# Patient Record
Sex: Female | Born: 1955 | Race: White | Hispanic: No | Smoking: Former smoker
Health system: Southern US, Community
[De-identification: ages and names within clinical notes are randomized; demographics above are authoritative.]

## PROBLEM LIST (undated history)

## (undated) ENCOUNTER — Emergency Department (HOSPITAL_COMMUNITY): Admission: EM | Payer: 59 | Source: Home / Self Care

## (undated) DIAGNOSIS — IMO0001 Reserved for inherently not codable concepts without codable children: Secondary | ICD-10-CM

## (undated) DIAGNOSIS — F32A Depression, unspecified: Secondary | ICD-10-CM

## (undated) DIAGNOSIS — K219 Gastro-esophageal reflux disease without esophagitis: Secondary | ICD-10-CM

## (undated) DIAGNOSIS — T7840XA Allergy, unspecified, initial encounter: Secondary | ICD-10-CM

## (undated) DIAGNOSIS — F329 Major depressive disorder, single episode, unspecified: Secondary | ICD-10-CM

## (undated) DIAGNOSIS — J439 Emphysema, unspecified: Secondary | ICD-10-CM

## (undated) DIAGNOSIS — G8929 Other chronic pain: Secondary | ICD-10-CM

## (undated) DIAGNOSIS — F419 Anxiety disorder, unspecified: Secondary | ICD-10-CM

## (undated) DIAGNOSIS — M1990 Unspecified osteoarthritis, unspecified site: Secondary | ICD-10-CM

## (undated) HISTORY — DX: Anxiety disorder, unspecified: F41.9

## (undated) HISTORY — DX: Gastro-esophageal reflux disease without esophagitis: K21.9

## (undated) HISTORY — PX: BREAST CYST EXCISION: SHX579

## (undated) HISTORY — PX: FOOT SURGERY: SHX648

## (undated) HISTORY — DX: Unspecified osteoarthritis, unspecified site: M19.90

## (undated) HISTORY — DX: Other chronic pain: G89.29

## (undated) HISTORY — DX: Emphysema, unspecified: J43.9

## (undated) HISTORY — PX: ABDOMINAL HYSTERECTOMY: SHX81

## (undated) HISTORY — PX: KNEE SURGERY: SHX244

## (undated) HISTORY — PX: JOINT REPLACEMENT: SHX530

## (undated) HISTORY — DX: Allergy, unspecified, initial encounter: T78.40XA

---

## 1999-07-02 ENCOUNTER — Encounter: Admission: RE | Admit: 1999-07-02 | Discharge: 1999-07-02 | Payer: Self-pay | Admitting: Family Medicine

## 1999-07-02 ENCOUNTER — Encounter: Payer: Self-pay | Admitting: Family Medicine

## 2002-09-10 ENCOUNTER — Inpatient Hospital Stay (HOSPITAL_COMMUNITY): Admission: AD | Admit: 2002-09-10 | Discharge: 2002-09-14 | Payer: Self-pay | Admitting: Psychiatry

## 2002-09-15 ENCOUNTER — Other Ambulatory Visit (HOSPITAL_COMMUNITY): Admission: RE | Admit: 2002-09-15 | Discharge: 2002-09-20 | Payer: Self-pay | Admitting: Psychiatry

## 2005-10-13 ENCOUNTER — Other Ambulatory Visit: Admission: RE | Admit: 2005-10-13 | Discharge: 2005-10-13 | Payer: Self-pay | Admitting: Family Medicine

## 2005-11-13 ENCOUNTER — Emergency Department (HOSPITAL_COMMUNITY): Admission: EM | Admit: 2005-11-13 | Discharge: 2005-11-13 | Payer: Self-pay | Admitting: Emergency Medicine

## 2009-02-03 HISTORY — PX: SHOULDER SURGERY: SHX246

## 2009-06-11 ENCOUNTER — Ambulatory Visit (HOSPITAL_COMMUNITY): Admission: RE | Admit: 2009-06-11 | Discharge: 2009-06-11 | Payer: Self-pay | Admitting: Orthopaedic Surgery

## 2009-07-16 ENCOUNTER — Encounter (HOSPITAL_COMMUNITY): Admission: RE | Admit: 2009-07-16 | Discharge: 2009-08-15 | Payer: Self-pay | Admitting: Orthopaedic Surgery

## 2009-08-16 ENCOUNTER — Encounter (HOSPITAL_COMMUNITY): Admission: RE | Admit: 2009-08-16 | Discharge: 2009-09-15 | Payer: Self-pay | Admitting: Orthopaedic Surgery

## 2009-09-18 ENCOUNTER — Encounter (HOSPITAL_COMMUNITY): Admission: RE | Admit: 2009-09-18 | Discharge: 2009-10-18 | Payer: Self-pay | Admitting: Orthopaedic Surgery

## 2009-11-01 ENCOUNTER — Ambulatory Visit (HOSPITAL_COMMUNITY): Admission: RE | Admit: 2009-11-01 | Discharge: 2009-11-01 | Payer: Self-pay | Admitting: Orthopaedic Surgery

## 2010-07-26 ENCOUNTER — Encounter: Payer: Self-pay | Admitting: Family Medicine

## 2010-07-26 DIAGNOSIS — J45909 Unspecified asthma, uncomplicated: Secondary | ICD-10-CM | POA: Insufficient documentation

## 2010-08-16 ENCOUNTER — Encounter (HOSPITAL_COMMUNITY)
Admission: RE | Admit: 2010-08-16 | Discharge: 2010-08-16 | Disposition: A | Discharge status: Home / Self Care | Payer: BC Managed Care – PPO | Source: Ambulatory Visit | Attending: Orthopaedic Surgery | Admitting: Orthopaedic Surgery

## 2010-08-16 ENCOUNTER — Ambulatory Visit (HOSPITAL_COMMUNITY)
Admission: RE | Admit: 2010-08-16 | Discharge: 2010-08-16 | Disposition: A | Discharge status: Home / Self Care | Payer: BC Managed Care – PPO | Source: Ambulatory Visit | Attending: Ophthalmology | Admitting: Ophthalmology

## 2010-08-16 ENCOUNTER — Other Ambulatory Visit (HOSPITAL_COMMUNITY): Payer: Self-pay | Admitting: Ophthalmology

## 2010-08-16 DIAGNOSIS — Z01818 Encounter for other preprocedural examination: Secondary | ICD-10-CM | POA: Insufficient documentation

## 2010-08-16 DIAGNOSIS — Z01810 Encounter for preprocedural cardiovascular examination: Secondary | ICD-10-CM | POA: Insufficient documentation

## 2010-08-16 DIAGNOSIS — H269 Unspecified cataract: Secondary | ICD-10-CM | POA: Insufficient documentation

## 2010-08-16 DIAGNOSIS — Z01812 Encounter for preprocedural laboratory examination: Secondary | ICD-10-CM | POA: Insufficient documentation

## 2010-08-16 LAB — CBC
HCT: 43.2 % (ref 36.0–46.0)
Hemoglobin: 14.6 g/dL (ref 12.0–15.0)
MCH: 30.6 pg (ref 26.0–34.0)
MCHC: 33.8 g/dL (ref 30.0–36.0)
MCV: 90.6 fL (ref 78.0–100.0)
Platelets: 369 10*3/uL (ref 150–400)
RBC: 4.77 MIL/uL (ref 3.87–5.11)
RDW: 14.8 % (ref 11.5–15.5)
WBC: 10.5 10*3/uL (ref 4.0–10.5)

## 2010-08-16 LAB — COMPREHENSIVE METABOLIC PANEL
AST: 15 U/L (ref 0–37)
BUN: 23 mg/dL (ref 6–23)
CO2: 28 mEq/L (ref 19–32)
Calcium: 8.7 mg/dL (ref 8.4–10.5)
Chloride: 103 mEq/L (ref 96–112)
Creatinine, Ser: 0.75 mg/dL (ref 0.50–1.10)
GFR calc Af Amer: >60 mL/min (ref >60)
GFR calc non Af Amer: >60 mL/min (ref >60)
Glucose, Bld: 107 mg/dL — ABNORMAL HIGH (ref 70–99)
Total Bilirubin: 0.5 mg/dL (ref 0.3–1.2)

## 2010-08-16 LAB — URINALYSIS, ROUTINE W REFLEX MICROSCOPIC
Bilirubin Urine: NEGATIVE
Ketones, ur: NEGATIVE mg/dL
Leukocytes, UA: NEGATIVE
Nitrite: NEGATIVE
Urobilinogen, UA: 0.2 mg/dL (ref 0.0–1.0)
pH: 5 (ref 5.0–8.0)

## 2010-08-16 LAB — DIFFERENTIAL
Basophils Relative: 2 % — ABNORMAL HIGH (ref 0–1)
Eosinophils Absolute: 0.6 10*3/uL (ref 0.0–0.7)
Eosinophils Relative: 6 % — ABNORMAL HIGH (ref 0–5)
Neutrophils Relative %: 66 % (ref 43–77)

## 2010-08-16 LAB — PROTIME-INR
INR: 0.87 (ref 0.00–1.49)
Prothrombin Time: 12 seconds (ref 11.6–15.2)

## 2010-08-16 LAB — SURGICAL PCR SCREEN
MRSA, PCR: NEGATIVE
Staphylococcus aureus: NEGATIVE

## 2010-08-16 LAB — TYPE AND SCREEN
ABO/RH(D): O POS
Antibody Screen: NEGATIVE

## 2010-08-16 LAB — ABO/RH: ABO/RH(D): O POS

## 2010-08-16 LAB — APTT: aPTT: 29 seconds (ref 24–37)

## 2010-08-17 LAB — URINE CULTURE: Culture  Setup Time: 201207131755

## 2010-08-20 ENCOUNTER — Inpatient Hospital Stay (HOSPITAL_COMMUNITY)
Admission: RE | Admit: 2010-08-20 | Discharge: 2010-08-22 | DRG: 209 | Disposition: A | Discharge status: Home Health | Payer: BC Managed Care – PPO | Source: Ambulatory Visit | Attending: Orthopaedic Surgery | Admitting: Orthopaedic Surgery

## 2010-08-20 DIAGNOSIS — E876 Hypokalemia: Secondary | ICD-10-CM | POA: Diagnosis not present

## 2010-08-20 DIAGNOSIS — F329 Major depressive disorder, single episode, unspecified: Secondary | ICD-10-CM | POA: Diagnosis present

## 2010-08-20 DIAGNOSIS — Z23 Encounter for immunization: Secondary | ICD-10-CM

## 2010-08-20 DIAGNOSIS — M1710 Unilateral primary osteoarthritis, unspecified knee: Principal | ICD-10-CM | POA: Diagnosis present

## 2010-08-20 DIAGNOSIS — D62 Acute posthemorrhagic anemia: Secondary | ICD-10-CM | POA: Diagnosis not present

## 2010-08-20 DIAGNOSIS — IMO0002 Reserved for concepts with insufficient information to code with codable children: Principal | ICD-10-CM | POA: Diagnosis present

## 2010-08-20 DIAGNOSIS — Z87891 Personal history of nicotine dependence: Secondary | ICD-10-CM

## 2010-08-20 DIAGNOSIS — Z6833 Body mass index (BMI) 33.0-33.9, adult: Secondary | ICD-10-CM

## 2010-08-20 DIAGNOSIS — F3289 Other specified depressive episodes: Secondary | ICD-10-CM | POA: Diagnosis present

## 2010-08-20 DIAGNOSIS — E669 Obesity, unspecified: Secondary | ICD-10-CM | POA: Diagnosis present

## 2010-08-20 DIAGNOSIS — J45909 Unspecified asthma, uncomplicated: Secondary | ICD-10-CM | POA: Diagnosis present

## 2010-08-21 LAB — CBC
HCT: 31.1 % — ABNORMAL LOW (ref 36.0–46.0)
Hemoglobin: 10.4 g/dL — ABNORMAL LOW (ref 12.0–15.0)
MCV: 90.7 fL (ref 78.0–100.0)
RBC: 3.43 MIL/uL — ABNORMAL LOW (ref 3.87–5.11)
RDW: 14.7 % (ref 11.5–15.5)
WBC: 16.5 10*3/uL — ABNORMAL HIGH (ref 4.0–10.5)

## 2010-08-21 LAB — BASIC METABOLIC PANEL
BUN: 14 mg/dL (ref 6–23)
CO2: 27 mEq/L (ref 19–32)
Chloride: 103 mEq/L (ref 96–112)
Creatinine, Ser: 0.71 mg/dL (ref 0.50–1.10)
GFR calc Af Amer: >60 mL/min (ref >60)
Potassium: 4.2 mEq/L (ref 3.5–5.1)

## 2010-08-22 LAB — CBC
HCT: 27.6 % — ABNORMAL LOW (ref 36.0–46.0)
MCH: 30.5 pg (ref 26.0–34.0)
MCV: 91.4 fL (ref 78.0–100.0)
RBC: 3.02 MIL/uL — ABNORMAL LOW (ref 3.87–5.11)
RDW: 14.8 % (ref 11.5–15.5)
WBC: 9.3 10*3/uL (ref 4.0–10.5)

## 2010-08-22 LAB — BASIC METABOLIC PANEL
CO2: 27 mEq/L (ref 19–32)
Chloride: 105 mEq/L (ref 96–112)
Creatinine, Ser: 0.57 mg/dL (ref 0.50–1.10)
Glucose, Bld: 107 mg/dL — ABNORMAL HIGH (ref 70–99)

## 2010-08-30 NOTE — Op Note (Signed)
[name], Rebecca [name] NO.:  [id number]  MEDICAL RECORD NO.:  [id number]  LOCATION:  5016                         FACILITY:  [facility]  PHYSICIAN:  Claude Manges. Whitfield, M.D.DATE OF BIRTH:  Aug 30, 1955  DATE OF PROCEDURE:  08/20/2010 DATE OF DISCHARGE:                              OPERATIVE REPORT   PREOPERATIVE DIAGNOSES: 1. End-stage osteoarthritis, left knee. 2. Obesity (body mass index 33.3).  POSTOPERATIVE DIAGNOSES: 1. End-stage osteoarthritis, left knee. 2. Obesity (body mass index 33.3).  PROCEDURE:  Left total knee replacement.  SURGEON:  Claude Manges. Cleophas Dunker, MD  ASSISTANT:  Oris Drone. Petrarca, PA-C  ANESTHESIA:  General with supplemental femoral nerve block.  COMPLICATIONS:  None.  COMPONENTS:  DePuy LCS medium left femoral component, a size 3 tibial tray MBT revision, a 12.5-mm polyethylene bridging bearing and metal- backed 3-peg patella.  Components were secured with polymethylmethacrylate.  PROCEDURE:  [name] was met in the holding area and identified her left knee as the appropriate operative site.  Any questions were answered.  She was then transported to room #1 and placed under general orotracheal anesthesia.  She did receive a preoperative femoral nerve block in the holding area.  Nursing staff inserted a Foley catheter.  Urine was clear.  A tourniquet was applied to the left thigh.  The leg was then prepped with Betadine scrub and then DuraPrep from the tourniquet to the midfoot sterile draping was performed.  With the extremity still elevated was Esmarch exsanguinated with a proximal tourniquet at 350 mmHg.  A midline longitudinal incision was made centered about the patella extending from the superior pouch to the tibial tubercle.  Via sharp dissection, incision was carried down to the subcutaneous tissue.  The superficial capsule was incised in the midline and medial parapatellar incision was then made with the Bovie to the  deep capsule.  The joint was entered.  There was a minimal clear yellow effusion.  Patella was everted to 180 degrees.  The patella had a large circumferential osteophytes.  There was abundant beefy red synovitis.  Synovectomy was performed with a Bovie and the rongeur.  Large osteophytes removed from the medial and lateral femoral condyle.  There was near-complete loss of articular cartilage on the medial femoral condyle to a greater extent of the medial tibial plateau.  We did measure a medium femoral component.  Because of the patient's size, we elected to use the MBT tibia revision tray.  Accordingly, the first cut was made transversely with a 2-degree angle of declination using the external tibial guide.  Subsequent cuts were then made on the femur.  Flexion and extension gaps appeared to be symmetrical at 10 mm.  Lamina spreaders were then inserted into the medial and lateral compartments.  Medial and lateral menisci removed as well as ACL and PCL.  Osteophytes removed from the posterior femoral condyle medially and laterally.  MCL and LCL remained intact.  A 3-degree distal femoral valgus cut was made.  The finishing guide was applied to obtain the tapered cuts and to obtain the center holes in the anterior femur.  Retractors were then placed around the tibia and was advanced anteriorly.  We measured a #3  tibial tray.  Center hole was then made for the MBT revision stem.  This trial was then removed, and the revision stem with a revision tray with a long stem was then inserted, flushed on the tibia.  The keeled cut was then made with the MBT revision tibial tray in place.  A 10-mm bridging bearing was inserted followed by the medium femoral component.  I thought we had a little bit opening with varus and valgus stress and slight hyperextension. Accordingly, I switched to a 12.5-mm bridging bearing.  I thought we had excellent stability still with extension without  hyperextension, so we elected to use the 12.5-mm bridging bearing.  The patella was then prepared by removing approximately 8 mm of bone leaving 13 mm of patella thickness.  The 3 peg jig was then applied, 3 holes made, and the trial patella inserted and through a full range of motion, there was no instability.  Trial components removed.  The joint was copiously irrigated with saline solution.  Knee was placed in flexion.  Retractors were then inserted. The tibial tray was then inserted with polymethylmethacrylate.  It was then compressed and extraneous methacrylate was removed from around the edges.  The 12.5-mm bridging bearing was inserted followed by the cemented femoral component.  The knee was then placed in full extension. Any extraneous methacrylate above the femur and the tibia were removed.  Patella was also prepared with methacrylate and the patellar clamp, and again extraneous methacrylate was removed from around its periphery.  At approximately 14 minutes, the methacrylate had matured.  The joint was then explored.  Any hardened extraneous methacrylate was removed with a small osteotome and a mallet.  Exposed bone was covered with bone wax.  A 0.25% Marcaine with epinephrine was injected into the wound edges.  Tourniquet was deflated in an hour and 30 minutes.  Bleeding was controlled with the Bovie.  A medium-sized Hemovac was then inserted and exteriorized laterally.  We placed the knee through a full range of motion with perfect stability of the tibial tray.  No opening with varus or valgus stress.  Deep capsule was closed with interrupted #1 Ethibond.  Superficial capsule was closed with running 0 Vicryl.  Subcu closed in several layers with 2-0 Vicryl and 3-0 Monocryl.  Skin closed with Steri-Strips. Sterile bulky dressing was applied.  The patient was then awoken and returned to the postanesthesia recovery in satisfactory condition.     Claude Manges.  Cleophas Dunker, M.D.     PWW/MEDQ  D:  08/20/2010  T:  08/21/2010  Job:  [id number]  Electronically Signed by Norlene Campbell M.D. on 08/30/2010 04:51:21 PM

## 2010-09-12 ENCOUNTER — Ambulatory Visit (HOSPITAL_COMMUNITY)
Admission: RE | Admit: 2010-09-12 | Discharge: 2010-09-12 | Disposition: A | Discharge status: Home / Self Care | Payer: BC Managed Care – PPO | Source: Ambulatory Visit | Attending: Orthopaedic Surgery | Admitting: Orthopaedic Surgery

## 2010-09-12 DIAGNOSIS — R262 Difficulty in walking, not elsewhere classified: Secondary | ICD-10-CM | POA: Insufficient documentation

## 2010-09-12 DIAGNOSIS — M25669 Stiffness of unspecified knee, not elsewhere classified: Secondary | ICD-10-CM | POA: Insufficient documentation

## 2010-09-12 DIAGNOSIS — IMO0001 Reserved for inherently not codable concepts without codable children: Secondary | ICD-10-CM | POA: Insufficient documentation

## 2010-09-12 DIAGNOSIS — R269 Unspecified abnormalities of gait and mobility: Secondary | ICD-10-CM | POA: Insufficient documentation

## 2010-09-12 DIAGNOSIS — Z96659 Presence of unspecified artificial knee joint: Secondary | ICD-10-CM | POA: Insufficient documentation

## 2010-09-12 DIAGNOSIS — G8929 Other chronic pain: Secondary | ICD-10-CM | POA: Insufficient documentation

## 2010-09-12 DIAGNOSIS — M25569 Pain in unspecified knee: Secondary | ICD-10-CM | POA: Insufficient documentation

## 2010-09-12 NOTE — Progress Notes (Signed)
Physical Therapy Evaluation  Patient Name: Rebecca [name] Date: 09/12/2010 [id number] Charges: 1 Eval, 10' TE Re-eval Due: 10/10/10 Visit 1 of 8 HPI:  Past Surgical History: L TKR on 08/20/10  Assessment  POSTURE: WNL OBSERVATION: Incision healing well and does not appear to have signs of infection   MMT AROM in prone PROM in prone  Hip Flexion 4/5    Gluteus Medius  3/5    Gluteus Maximus 3/5    Adductor 3/5    Knee Extension 4/5 0 0  Knee Flexion 3+/5 75 100    NEUROLOGIC: n/a GAIT: mild antalgic gait w/SPC FUNCTION: See above.  Balance:  10 sec Tandem Stance BLE w/decreased ankle strategy.  R SLS: 10 sec, L SLS: 4 sec on static surface.  5 Sit to Stand: w/o UE support 8.0 sec PALPATION: Increased pain to medial knee   Special Test:  -90/90 HS test, - Ober's Sign, -Thomas Test, - S/S of DVT  Exercise/Treatments Supine: Hip Abduction 10x Hip Adduction 10x Single Leg Stance 3x30 sec.  SAQ's 10x L SLS 3x30 sec.  Goals PT Short Term Goals Short Term Goal 1: 1.Pt will be Independent in HEP in order to maximize therapeutic effect. Short Term Goal 2: 2.Pt will report pain less than 2/10 during 50%  of her day Short Term Goal 3: 3.Pt will demonstrate RLE SLS x20 sec. independently  Short Term Goal 4: 4.Pt will improve AROM to 0-100 PT Long Term Goals Long Term Goal 1: 1.Pt will improve L knee AROM to [facility] in order to ascend and descend stairs with 1 handrail and reciprocal pattern for easier entry into clients home.  Long Term Goal 2: 2.Pt will increase LE strength to [facility] in order to independently ambulate and stand for greater than an hour to participate in outdoor household activities. Long Term Goal 3: 3.Pt will improve her LEFS score by 9 points for improved perceived functional ability.  Long Term Goal 4: 4.Pt will demonstrate tandem walking x100 ft on uneven surface for improved safety with outdoor activities.  End of Session Patient Active Problem List    Diagnoses  . Asthma  . Knee pain  . Difficulty in walking  . Stiffness of joint, not elsewhere classified, lower leg  . S/P total knee replacement   PT - End of Session Activity Tolerance: Patient tolerated treatment well PT Assessment and Plan Clinical Impression Statement: Pt is a 55 y.o femal referred to PT s/p L TKR.  After examination it was found that the patient has current body structure impairments including increased pain, decreased functional strength, impaired balance, decreased ROM, and impaired perceived functional ability which are limiting her in the ability to participate in household, community and work related activities.  Pt will benefit from skilled physical therapy service to address the above body structure impairments in order to maximize function in order to improve quality of life. Rehab Potential: Good PT Frequency: Min 2X/week PT Duration: 4 weeks PT Treatment/Interventions: Gait training;Stair training;Functional mobility training;Therapeutic exercise;Neuromuscular re-education;Balance training;Patient/family education;Other (comment) (Manual and modalites for pain control ) PT Plan: Add Bike, steps, vector stance, squats, sit to stands w/o UE support, cybex when appropriate.   Time Calculation Start Time: 1109 Stop Time: 1152 Time Calculation (min): 43 min  Javarian Jakubiak 09/12/2010, 1:21 PM

## 2010-09-12 NOTE — Progress Notes (Signed)
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  Pt reports that she had a TKR after having multiple injuries to her L knee.  She reports that HHPT told her she had full extension and able to achieve 120 w/CPM, but with therapy was able to gain 95 degrees.  She reports that she continues to have some pain and would like to be functional and independent at with home and work related activities.  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 PREVIOUS THERAPY: HHPT for 6 visits  Cognitive Status: Pt is alert and oriented x4 Social History: married.  Raises chickens, ducks, turkeys, takes care of a garden.  Has a family with 2 grandchildren.  Enjoys being outside with her animals.   Current Functional Status: Lower Extremity Functional Scale Score (LEFS): 27/80. 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 Barriers to Education: None EMPLOYMENT DATA: Currently not working, but wants to go back.     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  Worse at night after being up on it all day.  Aggravating: Sitting with leg in dependent position.    Alleviating: resting with legs elevated OBJECTIVE: POSTURE: WNL OBSERVATION: Incision healing well and does not  appear to have signs of infection   MMT AROM in prone PROM in prone  Hip Flexion 4/5    Gluteus Medius  3/5    Gluteus Maximus 3/5    Adductor 3/5    Knee Extension 4/5 0 0  Knee Flexion 3+/5 75 100    NEUROLOGIC: n/a GAIT: mild antalgic gait w/SPC FUNCTION: See above.  Balance:  10 sec Tandem Stance BLE w/decreased ankle strategy.  R SLS: 10 sec, L SLS: 4 sec on static surface.  5 Sit to Stand: w/o UE support 8.0 sec PALPATION: Increased pain to medial knee  Special Test:  -90/90 HS test, - Ober's Sign, -Thomas Test, - S/S of DVT  PT Assessment and Plan Pt is a 55 y.o femal referred to PT s/p L TKR.  After examination it was found that the patient has current body structure impairments including increased pain, decreased functional strength, impaired balance, decreased ROM, difficulty with independent walking and impaired perceived functional ability which are limiting her in the ability to participate in household, community and work related activities.  Pt will benefit from skilled physical therapy service to address the above body structure impairments in order to maximize function in order to improve quality of life.      TREATMENT DIAGNOSIS: Knee Pain: 719.46, Knee stiffness 719.56, Difficulty walking 719.7, S/P TKR V43.65 Rehab Potential: Good PLAN: 1. Therapeutic exercise to include stretching, strengthening and HEP. 2. Gait training 3. Balance re-education 4. Manual techniques and modalities as needed for pain reduction.  FREQUENCY /  DURATION: 2 x/week x 4 weeks. SHORT TERM GOALS: to be met in 2 weeks. Patient will be able to: 1. Pt will be Independent in HEP in order to maximize therapeutic effect. 2. Pt will report pain less than 2/10 during 50%  of her day 3. Pt will demonstrate RLE SLS x20 sec. independently  4. Pt will improve AROM to 0-100 LONG TERM GOALS: to be met in 4 weeks. Patient will be able to: 1. Pt will improve L knee AROM to [facility] in order to ascend and  descend stairs with 1 handrail and reciprocal pattern for easier entry into clients home.  2. Pt will increase LE strength to [facility] in order to independently ambulate and stand for greater than an hour to participate in outdoor household activities. 3. Pt will improve her LEFS score by 9 points for improved perceived functional ability.  4. Pt will demonstrate tandem walking x100 ft on uneven surface for improved safety with outdoor activities.  INITIAL TREATMENT: Initial evaluation, therapeutic exercise and education in HEP. Pt agreeable to treatment plan.  PRECAUTIONS:N/A CONTRAINDICATIONS: N/A. Thank you for your referral,   ____________________________                            ______________________________   Annett Fabian, PT Date    Rebecca [name] 09/12/2010, 1:23 PM

## 2010-09-12 NOTE — Patient Instructions (Signed)
Initial evaluation, therapeutic exercise and education in HEP. Pt agreeable to treatment plan.

## 2010-09-17 ENCOUNTER — Ambulatory Visit (HOSPITAL_COMMUNITY)
Admission: RE | Admit: 2010-09-17 | Discharge: 2010-09-17 | Disposition: A | Discharge status: Home / Self Care | Payer: BC Managed Care – PPO | Source: Ambulatory Visit | Attending: Orthopaedic Surgery | Admitting: Orthopaedic Surgery

## 2010-09-17 NOTE — Progress Notes (Signed)
Physical Therapy Treatment Patient Name: Rebecca [name] [id number] Date: 09/17/2010  Time in:  2:37pm Time out: 3:12pm Visit:   2 of 8  Re-eval Due: 10/10/10  Charges:  Therex 32 minutes  HPI:  Past Surgical History: L TKR on 08/20/10   SUBJECTIVE: Symptoms/Limitations Symptoms: Pt. reports she has pain today from stretching it too far back. Pain Assessment Currently in Pain?: Yes Pain Score:   5 Pain Location: Knee Pain Orientation: Left Pain Type: Surgical pain   OBJECTIVE: Exercise/Treatments ROM/Warm-up:  Recumbent bike seat 10, 6 minutes at 2.0 STANDING: SLS L max of 21" (3 trials) Vector Stance 5X5" on Left Heel walking 1RT Toe walking 1RT Squats SEATED: Cybex leg press 5pl 2X10 Cybex quad 2pl 2X10 Cybex Hamstring 3pl 2X10    End of Session Patient Active Problem List  Diagnoses  . Asthma  . Knee pain  . Difficulty in walking  . Stiffness of joint, not elsewhere classified, lower leg  . S/P total knee replacement   PT - End of Session Activity Tolerance: Patient tolerated treatment well General Behavior During Session: [facility] for tasks performed Cognition: [facility] for tasks performed PT Assessment and Plan Clinical Impression Statement: Pt. unable to make full revolution on bike initially; able to complete all new therex without difficulty. PT Treatment/Interventions: Therapeutic exercise;Gait training PT Plan: Progress stength and stability; add wallsquats, step ups/downs, stool scoots and PROM for flexion.  Bascom Levels, Amy B 09/17/2010, 3:36 PM

## 2010-09-19 ENCOUNTER — Ambulatory Visit (HOSPITAL_COMMUNITY)
Admission: RE | Admit: 2010-09-19 | Discharge: 2010-09-19 | Disposition: A | Discharge status: Home / Self Care | Payer: BC Managed Care – PPO | Source: Ambulatory Visit

## 2010-09-19 NOTE — Progress Notes (Signed)
Physical Therapy Treatment Patient Details  Name: Rebecca [name] MRN: [id number] Date of Birth: 1955/05/12  Today's Date: 09/19/2010 Time: 1430-1530 Time Calculation (min): 60 min Visit#: 3 of 8 Re-eval: 10/10/10 Charge: therex 54 min  Subjective: Symptoms/Limitations Symptoms: 2-3/10 been on feet a lot today Pain Assessment Pain Score:   2 Pain Location: Knee Pain Orientation: Left  Objective:   Exercise/Treatments ROM/Warm-up: Recumbent bike seat 10, 6 minutes at 2.0  STANDING:  SLS L max of 21" (3 trials)  Vector Stance 5X5" on Left  Heel walking 1RT  Toe walking 1RT  Squats 10 x Wall squats 5x 5" 4" step up 10x  4" lateral step up 10x 4" step down 10x SEATED:  Stool scoots L LE 1RT Cybex leg press 5pl 2X10  Cybex quad 2pl 2X10  Cybex Hamstring 3pl 2X10 Supine: Heel slides 10x  PROM flexion 3x 30"   Physical Therapy Assessment and Plan PT Assessment and Plan Clinical Impression Statement: Pt tolerated well with treatment.  Pt stated pain increase following step down 4" and stool scoot.  Weak eccentric quad control presented with step down.  Pt able to perform all therex without diff just increase pain.  Stated would ice at home. PT Plan: Progress strength and stability.  Begin sidelying abduction and prone hip ext next session.    Goals    Problem List Patient Active Problem List  Diagnoses  . Asthma  . Knee pain  . Difficulty in walking  . Stiffness of joint, not elsewhere classified, lower leg  . S/P total knee replacement    PT - End of Session Activity Tolerance: Patient tolerated treatment well General Behavior During Session: [facility] for tasks performed Cognition: [facility] for tasks performed  Juel Burrow 09/19/2010, 3:40 PM

## 2010-09-24 ENCOUNTER — Ambulatory Visit (HOSPITAL_COMMUNITY)
Admission: RE | Admit: 2010-09-24 | Discharge: 2010-09-24 | Disposition: A | Discharge status: Home / Self Care | Payer: BC Managed Care – PPO | Source: Ambulatory Visit | Attending: Physical Therapy | Admitting: Physical Therapy

## 2010-09-24 NOTE — Progress Notes (Signed)
Physical Therapy Treatment Patient Details  Name: Rebecca [name] MRN: [id number] Date of Birth: 03-02-1955  Today's Date: 09/24/2010 Time: 1352-1440 Time Calculation (min): 48 min Visit#: 4 of 8 Re-eval: 10/10/10  Subjective: Symptoms/Limitations Symptoms: 2-3/10 today.   Pain Assessment Currently in Pain?: Yes Pain Score:   2 Pain Location: Knee Pain Orientation: Left Pain Type: Surgical pain       Exercise/Treatments ROM/Warm-up: Recumbent bike seat 10, 7 minutes at 3.0  STANDING:  SLS L max of  42" (3 trials)  Vector Stance 10X5" on Left  Heel walking 1RT  Toe walking 1RT  Wall squats 10x 5"  4" step up 10x  4" lateral step up 10x  4" step down 10x  SEATED:  Stool scoots L LE 1RT  Cybex leg press L LE 4pl 2X10  Cybex quad  L only 1.5pl 2X10  Cybex Hamstring L only 2pl 2X10  Supine:  Heel slides 10x  PROM flexion 3x 30" MFR and scar massage Sidelying: (add next visit) Hip Abduction Hip Adduction Prone:  (add next visit) Hip Extension  Manual Therapy Manual Therapy: Myofascial release Myofascial Release: MFR and scar massage to L knee incision to break up restrictions and adhesions.  Physical Therapy Assessment and Plan PT Assessment and Plan Clinical Impression Statement: Improving eccentric strength as demonstrated with step downs.  Progressed to isolation of L with cybex machines.  Added scar massage/MFR to break up adhesions.  Improved ROM afterward.  AROM to 95 degrees, PROM to 105 degrees. PT Treatment/Interventions: Therapeutic exercise PT Plan: Progress per POC.  Add sidelying abduction/adduction and prone hip ext next session.    Goals PT Short Term Goals PT Short Term Goal 1: 1.Pt will be Independent in HEP in order to maximize therapeutic effect. PT Short Term Goal 1 - Progress: Met PT Short Term Goal 2: 2.Pt will report pain less than 2/10 during 50%  of her day PT Short Term Goal 2 - Progress: Progressing toward goal PT Short Term Goal  3: 3.Pt will demonstrate RLE SLS x20 sec. independently  PT Short Term Goal 3 - Progress: Met PT Short Term Goal 4: 4.Pt will improve AROM to 0-100 PT Short Term Goal 4 - Progress: Progressing toward goal (AROM 95 degrees, PROM 105 degrees) PT Long Term Goals PT Long Term Goal 1: 1.Pt will improve L knee AROM to [facility] in order to ascend and descend stairs with 1 handrail and reciprocal pattern for easier entry into clients home.  PT Long Term Goal 1 - Progress: Not met PT Long Term Goal 2: 2.Pt will increase LE strength to [facility] in order to independently ambulate and stand for greater than an hour to participate in outdoor household activities. PT Long Term Goal 2 - Progress: Not met Long Term Goal 3: 3.Pt will improve her LEFS score by 9 points for improved perceived functional ability.  Long Term Goal 3 Progress: Not met Long Term Goal 4: 4.Pt will demonstrate tandem walking x100 ft on uneven surface for improved safety with outdoor activities.  Long Term Goal 4 Progress: Not met  Problem List Patient Active Problem List  Diagnoses  . Asthma  . Knee pain  . Difficulty in walking  . Stiffness of joint, not elsewhere classified, lower leg  . S/P total knee replacement    PT - End of Session Activity Tolerance: Patient tolerated treatment well General Behavior During Session: [facility] for tasks performed Cognition: [facility] for tasks performed  Emeline Gins B 09/24/2010, 2:49 PM

## 2010-09-27 ENCOUNTER — Ambulatory Visit (HOSPITAL_COMMUNITY)
Admission: RE | Admit: 2010-09-27 | Discharge: 2010-09-27 | Disposition: A | Discharge status: Home / Self Care | Payer: BC Managed Care – PPO | Source: Ambulatory Visit | Attending: Family Medicine | Admitting: Family Medicine

## 2010-09-27 NOTE — Progress Notes (Signed)
Physical Therapy Treatment Patient Details  Name: Rebecca [name] MRN: [id number] Date of Birth: 06/14/1955  Today's Date: 09/27/2010 Time: 4098-1191 Time Calculation (min): 53 min Charges: 39' TE, 8' Manual Visit#: 5 of 8 Re-eval: 10/10/10  Subjective: Symptoms/Limitations Symptoms: Pt reports she is feeling pretty good today.  she is able to get around well.   Pain Assessment Currently in Pain?: No/denies  Exercise/Treatments Aerobic Stationary Bike: 6'@3 .0 Stretches Knee: Self-Stretch to increase Flexion: 1 rep;30 seconds;Limitations Knee: Self-Stretch Limitations: Standing  Machines for Strengthening Cybex Knee Extension: L ecc lower 2.0 pl x10 Cybex Knee Flexion: L only 2.5 Pl 2x10 Cybex Leg Press: L only 4.5 Pl x10   Standing Lateral Step Up: Left;2 sets;10 reps;Step Height: 4" Forward Step Up: Left;2 sets;10 reps;Step Height: 4" Step Down: Left;2 sets;10 reps;Step Height: 4" Wall Squat: 10 reps;5 seconds SLS with Vectors: 10X5" Other Standing Knee Exercises: Heelwalk 2RT Other Standing Knee Exercises: [place] 2RT Seated Stool Scoot - Round Trips: 1 RT Supine Other Supine Knee Exercises: PROM see manual     Manual Therapy Manual Therapy: Joint mobilization Joint Mobilization: Grade II-IV Joint mobs to increase knee flexion  Other Manual Therapy: CTR to medial knee.Warm-up: Recumbent bike seat 10, 6 minutes at 3.0    Physical Therapy Assessment and Plan PT Assessment and Plan Clinical Impression Statement: Pt tolerated new increased weights and reps well without an increase in pain.  After manual therapy she was able to achieve AROM: 95 degrees PROM: 110 degrees.  Had notable decrease in scar tissue adhesions.  Continues to have difficulty with dynamic balance and antalgic gait.   PT Plan: Pt is completing 4 way SLR at home, added prone HS curls for HEP.  Cont to progress weight, reps and sets and improve overall knee flexion and gait abnormailities.      Goals    Problem List Patient Active Problem List  Diagnoses  . Asthma  . Knee pain  . Difficulty in walking  . Stiffness of joint, not elsewhere classified, lower leg  . S/P total knee replacement    PT - End of Session Activity Tolerance: Patient tolerated treatment well  Rhyker Silversmith 09/27/2010, 10:00 AM

## 2010-10-01 ENCOUNTER — Ambulatory Visit (HOSPITAL_COMMUNITY)
Admission: RE | Admit: 2010-10-01 | Discharge: 2010-10-01 | Disposition: A | Discharge status: Home / Self Care | Payer: BC Managed Care – PPO | Source: Ambulatory Visit

## 2010-10-01 NOTE — Progress Notes (Signed)
Physical Therapy Treatment Patient Details  Name: Rebecca [name] MRN: [id number] Date of Birth: 19-Jul-1955  Today's Date: 10/01/2010 Time: 1308-6578 Time Calculation (min): 50 min Visit#: 6 of 8 Re-eval: 10/10/10 Charge: therex: 44 min  Subjective: Symptoms/Limitations Symptoms: Pt stated pain free at entrance. Pain Assessment Currently in Pain?: No/denies  Objective: Supine PROM 0-113 degrees  Exercise/Treatments Stationary Bike: 6'@3 .0  Machines for Strengthening  Cybex Knee Extension: L ecc lower 2.0 pl x10  Cybex Knee Flexion: L only 2.5 Pl 2x10  Cybex Leg Press: L only 4.5 Pl x10   Standing  Lateral Step Up: Left;2 sets;15 reps;Step Height: 4"  Forward Step Up: Left;2 sets;15 reps;Step Height: 4"  Step Down: Left;2 sets;10 reps;Step Height: 4"  Wall Squat: 10 reps;5 seconds  SLS with Vectors: 10X5"  Other Standing Knee Exercises: Heelwalk 2RT  Other Standing Knee Exercises: Toewalk 2RT  Supine  Other Supine Knee Exercises: PROM see manual   Manual PROM 3x 30" for flexion.  Physical Therapy Assessment and Plan PT Assessment and Plan Clinical Impression Statement: Pt tolerated well to increased reps.  Weak eccentric control presented with step down.  No increased pain at end of session. PT Plan: Continue with current POC, progress ROM, strength and improve gait abnormalities.    Goals    Problem List Patient Active Problem List  Diagnoses  . Asthma  . Knee pain  . Difficulty in walking  . Stiffness of joint, not elsewhere classified, lower leg  . S/P total knee replacement    PT - End of Session Activity Tolerance: Patient tolerated treatment well General Behavior During Session: [facility] for tasks performed Cognition: [facility] for tasks performed  Juel Burrow 10/01/2010, 4:02 PM

## 2010-10-03 ENCOUNTER — Ambulatory Visit (HOSPITAL_COMMUNITY)
Admission: RE | Admit: 2010-10-03 | Discharge: 2010-10-03 | Disposition: A | Discharge status: Home / Self Care | Payer: BC Managed Care – PPO | Source: Ambulatory Visit | Attending: Orthopaedic Surgery | Admitting: Orthopaedic Surgery

## 2010-10-03 NOTE — Progress Notes (Signed)
Physical Therapy Treatment Patient Details  Name: Rebecca [name] MRN: [id number] Date of Birth: [birth year]  Today's Date: 10/03/2010 Time: 4098-1191 Time Calculation (min): 46 min Visit#: 7 of 8 Re-eval: 10/08/10 Charges:  Therex 40 mins  Subjective: Symptoms/Limitations Symptoms: Reports continues to do well.  No pain Pain Assessment Currently in Pain?: No/denies  Objective:  L Knee seated AROM 105 degrees, PROM 115 degrees  Exercise/Treatments Stationary Bike: 6'@3 .0  Machines for Strengthening  Cybex Knee Extension: L ecc lower 2.0 pl x10  Cybex Knee Flexion: L only 2.5 Pl 2x10  Cybex Leg Press: L only 4.5 Pl x10  Standing  Wall Squat: 10 reps;5 seconds  SLS 40" max SLS with Vectors: 10X5" bilaterally Other Standing Knee Exercises: Heelwalk 2RT  Other Standing Knee Exercises: Toewalk 2RT  Progressive lunges 1RT Staircase 1 flight reciprocally with 1 HR Seated:  PROM    Physical Therapy Assessment and Plan PT Assessment and Plan Clinical Impression Statement: Pt able to negotiate stairs reciprocally with 1 HR without difficulty.  Progressing well. PT Treatment/Interventions: Therapeutic exercise PT Plan: Re-eval next visit for possible discharge.       Problem List Patient Active Problem List  Diagnoses  . Asthma  . Knee pain  . Difficulty in walking  . Stiffness of joint, not elsewhere classified, lower leg  . S/P total knee replacement    PT - End of Session Activity Tolerance: Patient tolerated treatment well General Behavior During Session: [facility] for tasks performed  Lurena Nida 10/03/2010, 3:53 PM

## 2010-10-08 ENCOUNTER — Ambulatory Visit (HOSPITAL_COMMUNITY)
Admission: RE | Admit: 2010-10-08 | Discharge: 2010-10-08 | Disposition: A | Discharge status: Home / Self Care | Payer: BC Managed Care – PPO | Source: Ambulatory Visit | Attending: Family Medicine | Admitting: Family Medicine

## 2010-10-08 DIAGNOSIS — IMO0001 Reserved for inherently not codable concepts without codable children: Secondary | ICD-10-CM | POA: Insufficient documentation

## 2010-10-08 DIAGNOSIS — R269 Unspecified abnormalities of gait and mobility: Secondary | ICD-10-CM | POA: Insufficient documentation

## 2010-10-08 DIAGNOSIS — M25569 Pain in unspecified knee: Secondary | ICD-10-CM | POA: Insufficient documentation

## 2010-10-08 DIAGNOSIS — M25669 Stiffness of unspecified knee, not elsewhere classified: Secondary | ICD-10-CM | POA: Insufficient documentation

## 2010-10-08 NOTE — Progress Notes (Signed)
Physical Therapy Treatment/Discharge Summary Patient Details  Name: Rebecca [name] MRN: [id number] Date of Birth: [birth year]  Today's Date: 10/08/2010 Time: 4098-1191 Time Calculation (min): 38 min Charges: 1 MMT, 1 ROM, 25' TE Visit#: 8 of 8 Re-eval: 10/08/10  Subjective: Symptoms/Limitations Symptoms: I am doing excellent on my own.  I know I can do this all at home.  Pain Assessment Currently in Pain?: No/denies  Exercise/Treatments  Stationary Bike: 6'@3 .0 for ROM Standing   Wall Squat: 10 reps;5 seconds   SLS 42" max   SLS with Vectors: 10X5" bilaterally   Heelwalk 3RT   Toewalk 3RT   Progressive lunges 1RT   Staircase 1 flight reciprocally with 1 HR, lateral step ups 2 RT for glute med strengthening  5 Half kneels to stands with RLE and LLE Seated: PROM  Physical Therapy Assessment and Plan LEFS: 59/80 (27/80) Balance: L SLS: 42 sec Walking and Standing greater than an hour  MMT  AROM in prone  PROM in prone   Hip Flexion  5/5 (4/5)     Gluteus Medius  4/5 (3/5)     Gluteus Maximus  (3/5)     Adductor  (3/5)     Knee Extension  5/5 (4/5)  0  0   Knee Flexion  5/5 (3+/5)  90 (75)  110(100)    Pt was able to meet all of her functional goals. She continues to have difficulty with ROM againt gravity, however is able to achieve appropriate ROM while in the seated postion. Pt can independently demonstrate her HEP, has overall decreae in pain, improved balance, increased strength, functional ROM and improved percieved functional ablility. Pt will continue to benefit from her HEP.   Goals PT Short Term Goals PT Short Term Goal 1: 1.Pt will be Independent in HEP in order to maximize therapeutic effect. PT Short Term Goal 1 - Progress: Met PT Short Term Goal 2: 2.Pt will report pain less than 2/10 during 50%  of her day PT Short Term Goal 2 - Progress: Partly met PT Short Term Goal 3: 3.Pt will demonstrate RLE SLS x20 sec. independently  PT Short Term Goal 3 - Progress:  Met PT Short Term Goal 4: 4.Pt will improve AROM to 0-100 PT Short Term Goal 4 - Progress: Partly met PT Long Term Goals PT Long Term Goal 1: 1.Pt will improve L knee AROM to [facility] in order to ascend and descend stairs with 1 handrail and reciprocal pattern for easier entry into clients home.  PT Long Term Goal 1 - Progress: Met PT Long Term Goal 2: 2.Pt will increase LE strength to [facility] in order to independently ambulate and stand for greater than an hour to participate in outdoor household activities. PT Long Term Goal 2 - Progress: Met Long Term Goal 3: 3.Pt will improve her LEFS score by 9 points for improved perceived functional ability.  Long Term Goal 3 Progress: Met Long Term Goal 4: 4.Pt will demonstrate tandem walking x100 ft on uneven surface for improved safety with outdoor activities.  Long Term Goal 4 Progress: Met  Problem List Patient Active Problem List  Diagnoses  . Asthma  . Knee pain  . Difficulty in walking  . Stiffness of joint, not elsewhere classified, lower leg  . S/P total knee replacement    PT - End of Session Activity Tolerance: Patient tolerated treatment well  Janayia Burggraf 10/08/2010, 2:31 PM

## 2010-10-10 ENCOUNTER — Ambulatory Visit (HOSPITAL_COMMUNITY): Payer: BC Managed Care – PPO | Admitting: Physical Therapy

## 2011-12-11 ENCOUNTER — Other Ambulatory Visit (HOSPITAL_COMMUNITY): Payer: Self-pay | Admitting: Family Medicine

## 2011-12-11 DIAGNOSIS — Z1231 Encounter for screening mammogram for malignant neoplasm of breast: Secondary | ICD-10-CM

## 2012-01-13 ENCOUNTER — Ambulatory Visit (HOSPITAL_COMMUNITY)
Admission: RE | Admit: 2012-01-13 | Discharge: 2012-01-13 | Disposition: A | Discharge status: Home / Self Care | Payer: 59 | Source: Ambulatory Visit | Attending: Family Medicine | Admitting: Family Medicine

## 2012-01-13 DIAGNOSIS — Z1231 Encounter for screening mammogram for malignant neoplasm of breast: Secondary | ICD-10-CM | POA: Insufficient documentation

## 2012-06-24 ENCOUNTER — Ambulatory Visit (INDEPENDENT_AMBULATORY_CARE_PROVIDER_SITE_OTHER): Payer: 59 | Admitting: Otolaryngology

## 2012-06-24 DIAGNOSIS — H9209 Otalgia, unspecified ear: Secondary | ICD-10-CM

## 2012-06-24 DIAGNOSIS — H6980 Other specified disorders of Eustachian tube, unspecified ear: Secondary | ICD-10-CM

## 2012-06-24 DIAGNOSIS — H6120 Impacted cerumen, unspecified ear: Secondary | ICD-10-CM

## 2013-09-02 ENCOUNTER — Encounter (HOSPITAL_COMMUNITY): Payer: Self-pay | Admitting: Emergency Medicine

## 2013-09-02 ENCOUNTER — Inpatient Hospital Stay (HOSPITAL_COMMUNITY)
Admission: EM | Admit: 2013-09-02 | Discharge: 2013-09-04 | DRG: 918 | Disposition: A | Discharge status: Other Acute Inpatient Hospital | Payer: 59 | Attending: Internal Medicine | Admitting: Internal Medicine

## 2013-09-02 DIAGNOSIS — Z6841 Body Mass Index (BMI) 40.0 and over, adult: Secondary | ICD-10-CM

## 2013-09-02 DIAGNOSIS — T40601A Poisoning by unspecified narcotics, accidental (unintentional), initial encounter: Principal | ICD-10-CM | POA: Diagnosis present

## 2013-09-02 DIAGNOSIS — T426X1A Poisoning by other antiepileptic and sedative-hypnotic drugs, accidental (unintentional), initial encounter: Secondary | ICD-10-CM | POA: Diagnosis present

## 2013-09-02 DIAGNOSIS — J45909 Unspecified asthma, uncomplicated: Secondary | ICD-10-CM | POA: Diagnosis present

## 2013-09-02 DIAGNOSIS — R262 Difficulty in walking, not elsewhere classified: Secondary | ICD-10-CM

## 2013-09-02 DIAGNOSIS — M25669 Stiffness of unspecified knee, not elsewhere classified: Secondary | ICD-10-CM

## 2013-09-02 DIAGNOSIS — T394X2A Poisoning by antirheumatics, not elsewhere classified, intentional self-harm, initial encounter: Secondary | ICD-10-CM | POA: Diagnosis present

## 2013-09-02 DIAGNOSIS — Z23 Encounter for immunization: Secondary | ICD-10-CM

## 2013-09-02 DIAGNOSIS — T426X2A Poisoning by other antiepileptic and sedative-hypnotic drugs, intentional self-harm, initial encounter: Secondary | ICD-10-CM | POA: Diagnosis present

## 2013-09-02 DIAGNOSIS — T50902A Poisoning by unspecified drugs, medicaments and biological substances, intentional self-harm, initial encounter: Secondary | ICD-10-CM

## 2013-09-02 DIAGNOSIS — E66813 Obesity, class 3: Secondary | ICD-10-CM | POA: Diagnosis present

## 2013-09-02 DIAGNOSIS — Z87891 Personal history of nicotine dependence: Secondary | ICD-10-CM

## 2013-09-02 DIAGNOSIS — K219 Gastro-esophageal reflux disease without esophagitis: Secondary | ICD-10-CM | POA: Diagnosis present

## 2013-09-02 DIAGNOSIS — F332 Major depressive disorder, recurrent severe without psychotic features: Secondary | ICD-10-CM | POA: Diagnosis present

## 2013-09-02 DIAGNOSIS — T398X2A Poisoning by other nonopioid analgesics and antipyretics, not elsewhere classified, intentional self-harm, initial encounter: Secondary | ICD-10-CM

## 2013-09-02 DIAGNOSIS — T50901A Poisoning by unspecified drugs, medicaments and biological substances, accidental (unintentional), initial encounter: Secondary | ICD-10-CM

## 2013-09-02 DIAGNOSIS — Z79899 Other long term (current) drug therapy: Secondary | ICD-10-CM

## 2013-09-02 DIAGNOSIS — T400X1A Poisoning by opium, accidental (unintentional), initial encounter: Secondary | ICD-10-CM | POA: Diagnosis present

## 2013-09-02 DIAGNOSIS — T4271XA Poisoning by unspecified antiepileptic and sedative-hypnotic drugs, accidental (unintentional), initial encounter: Secondary | ICD-10-CM

## 2013-09-02 DIAGNOSIS — T4272XA Poisoning by unspecified antiepileptic and sedative-hypnotic drugs, intentional self-harm, initial encounter: Secondary | ICD-10-CM | POA: Diagnosis present

## 2013-09-02 DIAGNOSIS — Z96659 Presence of unspecified artificial knee joint: Secondary | ICD-10-CM

## 2013-09-02 HISTORY — DX: Major depressive disorder, single episode, unspecified: F32.9

## 2013-09-02 HISTORY — DX: Depression, unspecified: F32.A

## 2013-09-02 HISTORY — DX: Gastro-esophageal reflux disease without esophagitis: K21.9

## 2013-09-02 HISTORY — DX: Reserved for inherently not codable concepts without codable children: IMO0001

## 2013-09-02 LAB — CBC WITH DIFFERENTIAL/PLATELET
BASOS PCT: 2 % — AB (ref 0–1)
Basophils Absolute: 0.1 10*3/uL (ref 0.0–0.1)
EOS ABS: 0.6 10*3/uL (ref 0.0–0.7)
Eosinophils Relative: 7 % — ABNORMAL HIGH (ref 0–5)
HEMATOCRIT: 40.4 % (ref 36.0–46.0)
HEMOGLOBIN: 13 g/dL (ref 12.0–15.0)
LYMPHS ABS: 2.2 10*3/uL (ref 0.7–4.0)
Lymphocytes Relative: 25 % (ref 12–46)
MCH: 28.7 pg (ref 26.0–34.0)
MCHC: 32.2 g/dL (ref 30.0–36.0)
MCV: 89.2 fL (ref 78.0–100.0)
MONO ABS: 0.5 10*3/uL (ref 0.1–1.0)
MONOS PCT: 6 % (ref 3–12)
Neutro Abs: 5.3 10*3/uL (ref 1.7–7.7)
Neutrophils Relative %: 60 % (ref 43–77)
Platelets: 384 10*3/uL (ref 150–400)
RBC: 4.53 MIL/uL (ref 3.87–5.11)
RDW: 15.7 % — ABNORMAL HIGH (ref 11.5–15.5)
WBC: 8.8 10*3/uL (ref 4.0–10.5)

## 2013-09-02 LAB — COMPREHENSIVE METABOLIC PANEL
ALBUMIN: 3.5 g/dL (ref 3.5–5.2)
ALK PHOS: 119 U/L — AB (ref 39–117)
ALT: 24 U/L (ref 0–35)
AST: 22 U/L (ref 0–37)
Anion gap: 10 (ref 5–15)
BUN: 18 mg/dL (ref 6–23)
CALCIUM: 9.1 mg/dL (ref 8.4–10.5)
CO2: 27 mEq/L (ref 19–32)
Chloride: 102 mEq/L (ref 96–112)
Creatinine, Ser: 0.88 mg/dL (ref 0.50–1.10)
GFR calc Af Amer: 83 mL/min — ABNORMAL LOW (ref >90)
GFR calc non Af Amer: 72 mL/min — ABNORMAL LOW (ref >90)
Glucose, Bld: 115 mg/dL — ABNORMAL HIGH (ref 70–99)
POTASSIUM: 5.2 meq/L (ref 3.7–5.3)
SODIUM: 139 meq/L (ref 137–147)
TOTAL PROTEIN: 7.2 g/dL (ref 6.0–8.3)
Total Bilirubin: 0.2 mg/dL — ABNORMAL LOW (ref 0.3–1.2)

## 2013-09-02 LAB — BLOOD GAS, VENOUS
Acid-Base Excess: 0.7 mmol/L (ref 0.0–2.0)
BICARBONATE: 26.3 meq/L — AB (ref 20.0–24.0)
O2 Saturation: 78.4 %
PH VEN: 7.307 — AB (ref 7.250–7.300)
TCO2: 24.1 mmol/L (ref 0–100)
pCO2, Ven: 54.2 mmHg — ABNORMAL HIGH (ref 45.0–50.0)
pO2, Ven: 46.2 mmHg — ABNORMAL HIGH (ref 30.0–45.0)

## 2013-09-02 LAB — RAPID URINE DRUG SCREEN, HOSP PERFORMED
Amphetamines: NOT DETECTED
BARBITURATES: NOT DETECTED
BENZODIAZEPINES: NOT DETECTED
COCAINE: NOT DETECTED
Opiates: POSITIVE — AB
TETRAHYDROCANNABINOL: NOT DETECTED

## 2013-09-02 LAB — GLUCOSE, CAPILLARY
Glucose-Capillary: 120 mg/dL — ABNORMAL HIGH (ref 70–99)
Glucose-Capillary: 109 mg/dL — ABNORMAL HIGH (ref 70–99)
Glucose-Capillary: 85 mg/dL (ref 70–99)

## 2013-09-02 LAB — MRSA PCR SCREENING: MRSA BY PCR: NEGATIVE

## 2013-09-02 LAB — SALICYLATE LEVEL: Salicylate Lvl: <2 mg/dL — ABNORMAL LOW (ref 2.8–20.0)

## 2013-09-02 LAB — ACETAMINOPHEN LEVEL

## 2013-09-02 LAB — ETHANOL: Alcohol, Ethyl (B): <11 mg/dL (ref 0–11)

## 2013-09-02 MED ORDER — SODIUM CHLORIDE 0.9 % IV BOLUS (SEPSIS)
1000.0000 mL | Freq: Once | INTRAVENOUS | Status: AC
  Administered 2013-09-02: 1000 mL via INTRAVENOUS

## 2013-09-02 MED ORDER — CETYLPYRIDINIUM CHLORIDE 0.05 % MT LIQD
7.0000 mL | Freq: Two times a day (BID) | OROMUCOSAL | Status: DC
  Administered 2013-09-02 – 2013-09-03 (×4): 7 mL via OROMUCOSAL

## 2013-09-02 MED ORDER — ALBUTEROL SULFATE (2.5 MG/3ML) 0.083% IN NEBU
2.5000 mg | INHALATION_SOLUTION | RESPIRATORY_TRACT | Status: DC | PRN
  Administered 2013-09-03 – 2013-09-04 (×2): 2.5 mg via RESPIRATORY_TRACT
  Filled 2013-09-02 (×2): qty 3

## 2013-09-02 MED ORDER — PNEUMOCOCCAL VAC POLYVALENT 25 MCG/0.5ML IJ INJ
0.5000 mL | INJECTION | INTRAMUSCULAR | Status: DC
  Filled 2013-09-02: qty 0.5

## 2013-09-02 MED ORDER — MOMETASONE FURO-FORMOTEROL FUM 100-5 MCG/ACT IN AERO
2.0000 | INHALATION_SPRAY | Freq: Two times a day (BID) | RESPIRATORY_TRACT | Status: DC
  Administered 2013-09-02 – 2013-09-04 (×4): 2 via RESPIRATORY_TRACT
  Filled 2013-09-02: qty 8.8

## 2013-09-02 MED ORDER — FOLIC ACID 5 MG/ML IJ SOLN
1.0000 mg | Freq: Every day | INTRAMUSCULAR | Status: DC
  Administered 2013-09-03: 1 mg via INTRAVENOUS
  Filled 2013-09-02 (×3): qty 0.2

## 2013-09-02 MED ORDER — THIAMINE HCL 100 MG/ML IJ SOLN
Freq: Once | INTRAVENOUS | Status: AC
  Administered 2013-09-02: 13:00:00 via INTRAVENOUS
  Filled 2013-09-02: qty 1000

## 2013-09-02 MED ORDER — POLYETHYLENE GLYCOL 3350 17 G PO PACK: 17.0000 g | PACK | Freq: Every day | ORAL | Status: DC | PRN

## 2013-09-02 MED ORDER — TRAMADOL HCL 50 MG PO TABS
50.0000 mg | ORAL_TABLET | Freq: Three times a day (TID) | ORAL | Status: DC
  Administered 2013-09-03 – 2013-09-04 (×4): 50 mg via ORAL
  Filled 2013-09-02 (×4): qty 1

## 2013-09-02 MED ORDER — MOMETASONE FURO-FORMOTEROL FUM 100-5 MCG/ACT IN AERO
2.0000 | INHALATION_SPRAY | Freq: Two times a day (BID) | RESPIRATORY_TRACT | Status: DC
  Filled 2013-09-02: qty 8.8

## 2013-09-02 MED ORDER — NALOXONE HCL 0.4 MG/ML IJ SOLN
0.5000 mg | Freq: Once | INTRAMUSCULAR | Status: AC
  Administered 2013-09-02: 0.5 mg via INTRAVENOUS

## 2013-09-02 MED ORDER — PANTOPRAZOLE SODIUM 40 MG PO TBEC
40.0000 mg | DELAYED_RELEASE_TABLET | Freq: Every day | ORAL | Status: DC
  Administered 2013-09-03 – 2013-09-04 (×2): 40 mg via ORAL
  Filled 2013-09-02 (×2): qty 1

## 2013-09-02 MED ORDER — THIAMINE HCL 100 MG/ML IJ SOLN
100.0000 mg | Freq: Every day | INTRAMUSCULAR | Status: DC
  Administered 2013-09-02 – 2013-09-03 (×2): 100 mg via INTRAVENOUS
  Filled 2013-09-02 (×2): qty 2

## 2013-09-02 MED ORDER — DEXTROSE-NACL 5-0.45 % IV SOLN
INTRAVENOUS | Status: DC
  Administered 2013-09-02: 1000 mL via INTRAVENOUS
  Administered 2013-09-03 – 2013-09-04 (×2): via INTRAVENOUS

## 2013-09-02 MED ORDER — SODIUM CHLORIDE 0.9 % IV SOLN
Freq: Once | INTRAVENOUS | Status: AC
  Administered 2013-09-02: 08:00:00 via INTRAVENOUS

## 2013-09-02 MED ORDER — HEPARIN SODIUM (PORCINE) 5000 UNIT/ML IJ SOLN
5000.0000 [IU] | Freq: Three times a day (TID) | INTRAMUSCULAR | Status: DC
  Administered 2013-09-02 – 2013-09-04 (×5): 5000 [IU] via SUBCUTANEOUS
  Filled 2013-09-02 (×6): qty 1

## 2013-09-02 MED ORDER — PAROXETINE HCL ER 25 MG PO TB24
50.0000 mg | ORAL_TABLET | Freq: Every day | ORAL | Status: DC
  Administered 2013-09-03 – 2013-09-04 (×2): 50 mg via ORAL
  Filled 2013-09-02 (×3): qty 2

## 2013-09-02 MED ORDER — GABAPENTIN 300 MG PO CAPS
300.0000 mg | ORAL_CAPSULE | Freq: Three times a day (TID) | ORAL | Status: DC
Start: 2013-09-02 — End: 2013-09-04
  Administered 2013-09-03 – 2013-09-04 (×4): 300 mg via ORAL
  Filled 2013-09-02 (×4): qty 1

## 2013-09-02 MED ORDER — ONDANSETRON HCL 4 MG PO TABS: 4.0000 mg | ORAL_TABLET | Freq: Four times a day (QID) | ORAL | Status: DC | PRN

## 2013-09-02 MED ORDER — ONDANSETRON HCL 4 MG/2ML IJ SOLN: 4.0000 mg | Freq: Four times a day (QID) | INTRAMUSCULAR | Status: DC | PRN

## 2013-09-02 MED ORDER — SODIUM CHLORIDE 0.9 % IJ SOLN
3.0000 mL | Freq: Two times a day (BID) | INTRAMUSCULAR | Status: DC
  Administered 2013-09-02 – 2013-09-03 (×3): 3 mL via INTRAVENOUS

## 2013-09-02 MED ORDER — HYDRALAZINE HCL 20 MG/ML IJ SOLN: 5.0000 mg | Freq: Four times a day (QID) | INTRAMUSCULAR | Status: DC | PRN

## 2013-09-02 MED ORDER — SODIUM CHLORIDE 0.9 % IV SOLN
INTRAVENOUS | Status: AC
  Administered 2013-09-02: 11:00:00 via INTRAVENOUS

## 2013-09-02 NOTE — ED Notes (Signed)
0.4 mg of Narcan given at 8:10am.

## 2013-09-02 NOTE — ED Notes (Signed)
Notified poison control that patient stated in her suicide note that she took an unknown amount of Ambien, tramadol, and hydrocodone at 12:41 a.m. (0041). Poison control recommended to watch for CNS/ Resp depression, narcan (higher dose if needed), IV fluids if hypotensive (administerd on arrival to ED), labs, ekg, and monitor (already in place).  VSS.

## 2013-09-02 NOTE — ED Provider Notes (Addendum)
CSN: [id number]     Arrival date & time 09/02/13  0807 History  This chart was scribed for Doug SouSam Hephzibah Strehle, MD by Milly JakobJohn Lee Graves, ED Scribe. The patient was seen in room APA02/APA02. Patient's care was started at 8:05 AM.   Chief Complaint  Patient presents with  . Drug Overdose   The history is provided by the EMS personnel. The history is limited by the condition of the patient. No language interpreter was used.   Level 5 Caveat: Patient Unresponsive HPI Comments: Rebecca [name] is a 58 y.o. female who presents to the Emergency Department after an intentional overdose sometime after 12:00 AM  yesterday. Pt was found laying on the couch this morning by her husband, and it is reported by the EMS that she took unknown medication with the intention to harm herself. Patient left a suicide note stating that she took a lot of hydrocodone, tramadol and Ambien She is breathing, but unresponsive. Upon arrival her pupils are pinpoint and she arouses to sternal rub. Her husband reports that she has been depressed and was scheduled to have a mental health appointment this week. He reports a history of knee replacement and shoulder reconstruction. He reports that she does not smoke or drink usually, but that yesterday she had someone buy her a pack of cigarettes. He reports that her Doctor is Dr. Thomes LollingFulk.   PCP: Leo GrosserPICKARD,WARREN TOM, MD   Past Medical History  Diagnosis Date  . Asthma    No past surgical history on file. No family history on file. History  Substance Use Topics  . Smoking status: Not on file  . Smokeless tobacco: Not on file  . Alcohol Use: Not on file   her husband positive smoker positive alcohol no illicit drug OB History   Grav Para Term Preterm Abortions TAB SAB Ect Mult Living                 Review of Systems  Unable to perform ROS: Mental status change   Level 5 Caveat: Patient Unresponsive   Allergies  Sulfa antibiotics  Home Medications   Prior to Admission  medications   Medication Sig Start Date End Date Taking? Authorizing Provider  fluticasone-salmeterol (ADVAIR HFA) 115-21 MCG/ACT inhaler Inhale 2 puffs into the lungs 2 (two) times daily.      Historical Provider, MD   SpO2 90% Physical Exam  Nursing note and vitals reviewed. Constitutional: She appears well-developed and well-nourished.  Arousable to noxious stimulant with non-purposeful movement. GCS = 7. 2 for eye openning, 1 for verbal, and 4 for motor.   HENT:  Head: Normocephalic and atraumatic.  Eyes: Conjunctivae are normal. Pupils are equal, round, and reactive to light.  Neck: Neck supple. No tracheal deviation present. No thyromegaly present.  Cardiovascular: Normal rate and regular rhythm.   No murmur heard. Pulmonary/Chest: Effort normal and breath sounds normal.  Abdominal: Soft. Bowel sounds are normal. She exhibits no distension. There is no tenderness.  Musculoskeletal: Normal range of motion. She exhibits no edema and no tenderness.  Neurological: She is alert. Coordination normal.  Skin: Skin is warm and dry. No rash noted.  Psychiatric: She has a normal mood and affect.    ED Course  Procedures (including critical care time) DIAGNOSTIC STUDIES: Oxygen Saturation is 90% on room air, low by my interpretation.    COORDINATION OF CARE: 8:20 AM - 0.5 mg of NARCAN administered IV. Patient became more responsive at 8:25 am. GCS = 13.   6  for motor, 3 for eye opening, and 4 for verbal.   9:05 AM remainsomnolent. Arousable to verbal stimulus. Glasgow Coma Score remains at 13 9:35 AM neurologic status unchanged patient remains somnolent. Labs Review Labs Reviewed - No data to display  Imaging Review No results found.   EKG Interpretation   Date/Time:  Friday September 02 2013 08:12:55 EDT Ventricular Rate:  110 PR Interval:  156 QRS Duration: 80 QT Interval:  330 QTC Calculation: 446 R Axis:   82 Text Interpretation:  Sinus tachycardia Abnormal R-wave  progression, early  transition Borderline T wave abnormalities SINCE LAST TRACING HEART RATE  HAS INCREASED Confirmed by Ethelda Chick  MD, Adryanna Friedt [id number]) on 09/02/2013  8:14:35 AM      Poison control notified. Watch for mental status changes and respiratory status Results for orders placed during the hospital encounter of 09/02/13  BLOOD GAS, VENOUS      Result Value Ref Range   pH, Ven 7.307 (*) 7.250 - 7.300   pCO2, Ven 54.2 (*) 45.0 - 50.0 mmHg   pO2, Ven 46.2 (*) 30.0 - 45.0 mmHg   Bicarbonate 26.3 (*) 20.0 - 24.0 mEq/L   TCO2 24.1  0 - 100 mmol/L   Acid-Base Excess 0.7  0.0 - 2.0 mmol/L   O2 Saturation 78.4     Collection site VEIN     Drawn by COLLECTED BY LABORATORY     Sample type VENOUS    COMPREHENSIVE METABOLIC PANEL      Result Value Ref Range   Sodium 139  137 - 147 mEq/L   Potassium 5.2  3.7 - 5.3 mEq/L   Chloride 102  96 - 112 mEq/L   CO2 27  19 - 32 mEq/L   Glucose, Bld 115 (*) 70 - 99 mg/dL   BUN 18  6 - 23 mg/dL   Creatinine, Ser 6.04  0.50 - 1.10 mg/dL   Calcium 9.1  8.4 - 54.0 mg/dL   Total Protein 7.2  6.0 - 8.3 g/dL   Albumin 3.5  3.5 - 5.2 g/dL   AST 22  0 - 37 U/L   ALT 24  0 - 35 U/L   Alkaline Phosphatase 119 (*) 39 - 117 U/L   Total Bilirubin 0.2 (*) 0.3 - 1.2 mg/dL   GFR calc non Af Amer 72 (*) >90 mL/min   GFR calc Af Amer 83 (*) >90 mL/min   Anion gap 10  5 - 15  CBC WITH DIFFERENTIAL      Result Value Ref Range   WBC 8.8  4.0 - 10.5 K/uL   RBC 4.53  3.87 - 5.11 MIL/uL   Hemoglobin 13.0  12.0 - 15.0 g/dL   HCT 98.1  19.1 - 47.8 %   MCV 89.2  78.0 - 100.0 fL   MCH 28.7  26.0 - 34.0 pg   MCHC 32.2  30.0 - 36.0 g/dL   RDW 29.5 (*) 62.1 - 30.8 %   Platelets 384  150 - 400 K/uL   Neutrophils Relative % 60  43 - 77 %   Neutro Abs 5.3  1.7 - 7.7 K/uL   Lymphocytes Relative 25  12 - 46 %   Lymphs Abs 2.2  0.7 - 4.0 K/uL   Monocytes Relative 6  3 - 12 %   Monocytes Absolute 0.5  0.1 - 1.0 K/uL   Eosinophils Relative 7 (*) 0 - 5 %   Eosinophils  Absolute 0.6  0.0 - 0.7 K/uL   Basophils  Relative 2 (*) 0 - 1 %   Basophils Absolute 0.1  0.0 - 0.1 K/uL  URINE RAPID DRUG SCREEN (HOSP PERFORMED)      Result Value Ref Range   Opiates POSITIVE (*) NONE DETECTED   Cocaine NONE DETECTED  NONE DETECTED   Benzodiazepines NONE DETECTED  NONE DETECTED   Amphetamines NONE DETECTED  NONE DETECTED   Tetrahydrocannabinol NONE DETECTED  NONE DETECTED   Barbiturates NONE DETECTED  NONE DETECTED  ACETAMINOPHEN LEVEL      Result Value Ref Range   Acetaminophen (Tylenol), Serum <15.0  10 - 30 ug/mL  SALICYLATE LEVEL      Result Value Ref Range   Salicylate Lvl <2.0 (*) 2.8 - 20.0 mg/dL  ETHANOL      Result Value Ref Range   Alcohol, Ethyl (B) <11  0 - 11 mg/dL   No results found.  MDM   Final diagnoses:  None   9:40 AM spoke with Dr.Feliz admit to ICU. Patient will need monitoring of mental status, cardiopulmonary status and eventual psychiatric counseling Diagnosis #1 drug overdose #2 suicide attempt  CRITICAL CARE Performed by: Doug Sou Total critical care time: 45 minute Critical care time was exclusive of separately billable procedures and treating other patients. Critical care was necessary to treat or prevent imminent or life-threatening deterioration. Critical care was time spent personally by me on the following activities: development of treatment plan with patient and/or surrogate as well as nursing, discussions with consultants, evaluation of patient's response to treatment, examination of patient, obtaining history from patient or surrogate, ordering and performing treatments and interventions, ordering and review of laboratory studies, ordering and review of radiographic studies, pulse oximetry and re-evaluation of patient's condition.   I personally performed the services described in this documentation, which was scribed in my presence. The recorded information has been reviewed and considered. Doug Sou,  MD 09/02/13 1610  Doug Sou, MD 09/02/13 [phone]

## 2013-09-02 NOTE — ED Notes (Signed)
Pt more alert at this time to verbal stimuli, still obtunded, sats maintained at 98% 2L [place], husband at bedside, pt opens eyes and nods but speech still very slurred.

## 2013-09-02 NOTE — Progress Notes (Signed)
Nutrition Brief Note  Patient identified on the Malnutrition Screening Tool (MST) Report  Pt from home and presents with intentional overdose.   Wt Readings from Last 15 Encounters:  09/02/13 274 lb 4 oz (124.4 kg)    Body mass index is 45.64 kg/(m^2). Patient meets criteria for obesity class III based on current BMI.   Current diet order is NPO. Labs and medications reviewed.   No nutrition interventions warranted at this time. If nutrition issues arise, please consult RD.   Royann ShiversLynn Tova Vater MS,RD,CSG,LDN Office: [phone] Pager: [phone]

## 2013-09-02 NOTE — Progress Notes (Signed)
Pt's BP continues to elevate. MD paged and he ordered PRN dose of hydralazine if SBP>180. At this point pt's BP is 150s-160s/110s-120s. MD is aware and is not concerned about the diastolic pressure and does not want pt receiving medication unless SBP is >180. Will continue to monitor.

## 2013-09-02 NOTE — ED Notes (Signed)
Pt was found laying on the couch this morning by her husband. Pt took unknown meds with intention to harm herself. Pupils are pinpoint on arrival. Arouses to sternal rub.

## 2013-09-02 NOTE — H&P (Signed)
[facility] History and Physical  Rebecca [name] ZOX:[id number] DOB: 02/13/1955 DOA: 09/02/2013  Referring physician: Dr. Clearnce HastenJackopbowitz PCP: Leo GrosserPICKARD,WARREN TOM, MD   Chief Complaint: intentional overdose  HPI: Rebecca [name] is a 58 y.o. female  58 year old female with past medical history of suicide attempt more than 2  years ago more than by EMS for an intentional overdose sometime around 12 AM on the morning of admission. The patient was found on the couch by her husband. She was breathing but unresponsive. The daughter relates that she has been under the weather for the last 10 months she was started on Abilify by her primary care doctor and there was some improvement. She was supposed to have a mental health appointment last week but had to be rescheduled.  In the ED: She was given Narcan by Dr. Rennis ChrisJacobowitz with improvement. Basic metabolic panel is within normal limits CBC does not show any leukocytosis was of a level less than 5 acetaminophen level less than 50 alcohol level was 100 her UDS was positive for opiates. An EKG shows sinus tachycardia with a QTC of 446. Glasgow Coma Scale on admission was 6 after Narcan 13.   Review of Systems:  Constitutional:  No weight loss, night sweats, Fevers, chills, fatigue.  HEENT:  No headaches, Difficulty swallowing,Tooth/dental problems,Sore throat,  No sneezing, itching, ear ache, nasal congestion, post nasal drip,  Cardio-vascular:  No chest pain, Orthopnea, PND, swelling in lower extremities, anasarca, dizziness, palpitations  GI:  No heartburn, indigestion, abdominal pain, nausea, vomiting, diarrhea, change in bowel habits, loss of appetite  Resp:  No shortness of breath with exertion or at rest. No excess mucus, no productive cough, No non-productive cough, No coughing up of blood.No change in color of mucus.No wheezing.No chest wall deformity  Skin:  no rash or lesions.  GU:  no dysuria, change in color of urine, no urgency  or frequency. No flank pain.  Musculoskeletal:  No joint pain or swelling. No decreased range of motion. No back pain.  Psych:  No change in mood or affect. No depression or anxiety. No memory loss.   Past Medical History  Diagnosis Date  . Asthma   . Depression   . Reflux    Past Surgical History  Procedure Laterality Date  . Unable     Social History:  reports that she drinks about 1.2 ounces of alcohol per week. She reports that she does not use illicit drugs. Her tobacco history is not on file.  Allergies  Allergen Reactions  . Sulfa Antibiotics Other (See Comments)    unknown    History reviewed. No pertinent family history.   Prior to Admission medications   Medication Sig Start Date End Date Taking? Authorizing Provider  ALPRAZolam Prudy Feeler(XANAX) 0.5 MG tablet Take 0.25-0.5 mg by mouth 2 (two) times daily as needed for anxiety.   Yes Historical Provider, MD  ARIPiprazole (ABILIFY) 5 MG tablet Take 5 mg by mouth daily.   Yes Historical Provider, MD  fluticasone-salmeterol (ADVAIR HFA) 115-21 MCG/ACT inhaler Inhale 2 puffs into the lungs 2 (two) times daily.     Yes Historical Provider, MD  gabapentin (NEURONTIN) 300 MG capsule Take 300 mg by mouth 3 (three) times daily.   Yes Historical Provider, MD  HYDROcodone-acetaminophen (NORCO) 7.5-325 MG per tablet Take 1-2 tablets by mouth every 4 (four) hours as needed for moderate pain.   Yes Historical Provider, MD  lidocaine (LIDODERM) 5 % Place 1 patch onto the skin daily. Remove &  Discard patch within 12 hours or as directed by MD   Yes Historical Provider, MD  pantoprazole (PROTONIX) 40 MG tablet Take 40 mg by mouth daily.   Yes Historical Provider, MD  PARoxetine (PAXIL-CR) 25 MG 24 hr tablet Take 50 mg by mouth daily.   Yes Historical Provider, MD  rOPINIRole (REQUIP) 0.5 MG tablet Take 0.5 mg by mouth at bedtime.    Yes Historical Provider, MD  traMADol (ULTRAM) 50 MG tablet Take 50 mg by mouth 3 (three) times daily.   Yes  Historical Provider, MD  zolpidem (AMBIEN) 10 MG tablet Take 10 mg by mouth at bedtime.   Yes Historical Provider, MD   Physical Exam: Filed Vitals:   09/02/13 0905 09/02/13 0922 09/02/13 0930 09/02/13 0935  BP: 118/68  119/73 116/88  Pulse: 97  99 105  Temp:  97.2 F (36.2 C)    TempSrc:  Rectal    Resp: 12  11 11   SpO2: 96%  97% 96%    Wt Readings from Last 3 Encounters:  No data found for Wt    General: Somnolent Eyes: PERRL, normal lids, irises & conjunctiva ENT: grossly normal hearing, lips & tongue Neck: no LAD, masses or thyromegaly Cardiovascular: RRR, no m/r/g. No LE edema. Telemetry: SR, no arrhythmias  Respiratory: Good air movement with crackles on the right. Abdomen: soft, ntnd Skin: no rash or induration seen on limited exam Musculoskeletal: grossly normal tone BUE/BLE Neurologic: grossly non-focal.          Labs on Admission:  Basic Metabolic Panel:  Recent Labs Lab 09/02/13 0841  NA 139  K 5.2  CL 102  CO2 27  GLUCOSE 115*  BUN 18  CREATININE 0.88  CALCIUM 9.1   Liver Function Tests:  Recent Labs Lab 09/02/13 0841  AST 22  ALT 24  ALKPHOS 119*  BILITOT 0.2*  PROT 7.2  ALBUMIN 3.5   No results found for this basename: LIPASE, AMYLASE,  in the last 168 hours No results found for this basename: AMMONIA,  in the last 168 hours CBC:  Recent Labs Lab 09/02/13 0841  WBC 8.8  NEUTROABS 5.3  HGB 13.0  HCT 40.4  MCV 89.2  PLT 384   Cardiac Enzymes: No results found for this basename: CKTOTAL, CKMB, CKMBINDEX, TROPONINI,  in the last 168 hours  BNP (last 3 results) No results found for this basename: PROBNP,  in the last 8760 hours CBG: No results found for this basename: GLUCAP,  in the last 168 hours  Radiological Exams on Admission: No results found.  EKG: Independently reviewed. Sinus tach  Assessment/Plan  Drug overdose, intentional - UDS was positive for opioids. She has a Xanax on her medication list but her UDS was  negative, she'll takes Abilify. Her QTC was 446. She is also on Paxil, Requip, neurontin and Ambien. On my examination she is arousable to voice and touch. She is able to follow simple commands. Her sats have been 100% on 2 L. Her tachycardia has resolved her blood pressure stable. - We'll continue to monitor her QTC, and saturation. Admitted to the ICU for close monitoring. - She also takes alcohol regularly. We'll start her on thiamine and folate. Her last drink was the day prior to admission. It her alcohol level was less than 1.  Code Status: full DVT Prophylaxis: heparin Family Communication: husband & daughter Disposition Plan: inpatient  Time spent: 80 minutes  Marinda Elk [facility] Pager [phone]  **Disclaimer: This note may  have been dictated with voice recognition software. Similar sounding words can inadvertently be transcribed and this note may contain transcription errors which may not have been corrected upon publication of note.**

## 2013-09-02 NOTE — Progress Notes (Signed)
Pt transferred to floor from ED. Pt is lethargic, but responsive to pain. VS are stable with slow respirations. Husband at bedside. Will continue to monitor.

## 2013-09-02 NOTE — ED Notes (Signed)
Husband states that pt called mental health to try and get in to see someone 2 weeks ago and the soonest she could get an appt was 09/20/13.

## 2013-09-02 NOTE — Progress Notes (Signed)
Pt has not voided since she came to floor earlier this AM. Called down to talk to the nurse who had her in the ED and he stated they emptied her bladder down in the ED and emptied 500ml. Bladder scanned pt and she had <1400mL in bladder. Will continue to monitor.

## 2013-09-03 DIAGNOSIS — E66813 Obesity, class 3: Secondary | ICD-10-CM | POA: Diagnosis present

## 2013-09-03 LAB — COMPREHENSIVE METABOLIC PANEL
ALBUMIN: 3.1 g/dL — AB (ref 3.5–5.2)
ALT: 24 U/L (ref 0–35)
ANION GAP: 10 (ref 5–15)
AST: 36 U/L (ref 0–37)
Alkaline Phosphatase: 107 U/L (ref 39–117)
BILIRUBIN TOTAL: 0.3 mg/dL (ref 0.3–1.2)
BUN: 20 mg/dL (ref 6–23)
CALCIUM: 7.9 mg/dL — AB (ref 8.4–10.5)
CO2: 22 mEq/L (ref 19–32)
CREATININE: 0.84 mg/dL (ref 0.50–1.10)
Chloride: 107 mEq/L (ref 96–112)
GFR calc Af Amer: 88 mL/min — ABNORMAL LOW (ref >90)
GFR calc non Af Amer: 76 mL/min — ABNORMAL LOW (ref >90)
Glucose, Bld: 91 mg/dL (ref 70–99)
Potassium: 4.7 mEq/L (ref 3.7–5.3)
Sodium: 139 mEq/L (ref 137–147)
Total Protein: 6.6 g/dL (ref 6.0–8.3)

## 2013-09-03 LAB — CBC
HEMATOCRIT: 36.9 % (ref 36.0–46.0)
Hemoglobin: 11.3 g/dL — ABNORMAL LOW (ref 12.0–15.0)
MCH: 28.6 pg (ref 26.0–34.0)
MCHC: 30.6 g/dL (ref 30.0–36.0)
MCV: 93.4 fL (ref 78.0–100.0)
PLATELETS: 365 10*3/uL (ref 150–400)
RBC: 3.95 MIL/uL (ref 3.87–5.11)
RDW: 16.3 % — ABNORMAL HIGH (ref 11.5–15.5)
WBC: 9.1 10*3/uL (ref 4.0–10.5)

## 2013-09-03 LAB — GLUCOSE, CAPILLARY
Glucose-Capillary: 89 mg/dL (ref 70–99)
Glucose-Capillary: 94 mg/dL (ref 70–99)
Glucose-Capillary: 78 mg/dL (ref 70–99)

## 2013-09-03 MED ORDER — MOMETASONE FURO-FORMOTEROL FUM 100-5 MCG/ACT IN AERO
INHALATION_SPRAY | RESPIRATORY_TRACT | Status: AC
  Filled 2013-09-03: qty 8.8

## 2013-09-03 NOTE — Progress Notes (Signed)
[facility] PROGRESS NOTE  Assessment/Plan: Drug overdose, intentional - pt more awake. Tachycardia and BP stable. - pt very emotional. - consult tele psyq.  Morbid obesity - counseling.    Code Status: full Family Communication: daughter and husband  Disposition Plan: inpateint   Consultants:  psyq  Procedures:  none  Antibiotics:  None  HPI/Subjective: Labile emotion.   Objective: Filed Vitals:   09/03/13 0400 09/03/13 0500 09/03/13 0600 09/03/13 0700  BP: 113/73 120/62 98/77 117/69  Pulse: 101 68 48 93  Temp:  98 F (36.7 C)    TempSrc:  Oral    Resp: 13 16 32 12  Height:      Weight:  127.6 kg (281 lb 4.9 oz)    SpO2: 98% 89% 55% 99%    Intake/Output Summary (Last 24 hours) at 09/03/13 0816 Last data filed at 09/03/13 0505  Gross per 24 hour  Intake 1798.75 ml  Output    600 ml  Net 1198.75 ml   Filed Weights   09/02/13 1100 09/03/13 0500  Weight: 124.4 kg (274 lb 4 oz) 127.6 kg (281 lb 4.9 oz)    Exam:  General: Alert, awake, oriented x3. HEENT: No bruits, no goiter.  Heart: Regular rate and rhythm. Lungs: Good air movement, clear Abdomen: Soft, nontender, nondistended, positive bowel sounds.  Neuro: Grossly intact, nonfocal.   Data Reviewed: Basic Metabolic Panel:  Recent Labs Lab 09/02/13 0841 09/03/13 0516  NA 139 139  K 5.2 4.7  CL 102 107  CO2 27 22  GLUCOSE 115* 91  BUN 18 20  CREATININE 0.88 0.84  CALCIUM 9.1 7.9*   Liver Function Tests:  Recent Labs Lab 09/02/13 0841 09/03/13 0516  AST 22 36  ALT 24 24  ALKPHOS 119* 107  BILITOT 0.2* 0.3  PROT 7.2 6.6  ALBUMIN 3.5 3.1*   No results found for this basename: LIPASE, AMYLASE,  in the last 168 hours No results found for this basename: AMMONIA,  in the last 168 hours CBC:  Recent Labs Lab 09/02/13 0841 09/03/13 0516  WBC 8.8 9.1  NEUTROABS 5.3  --   HGB 13.0 11.3*  HCT 40.4 36.9  MCV 89.2 93.4  PLT 384 365   Cardiac Enzymes: No  results found for this basename: CKTOTAL, CKMB, CKMBINDEX, TROPONINI,  in the last 168 hours BNP (last 3 results) No results found for this basename: PROBNP,  in the last 8760 hours CBG:  Recent Labs Lab 09/02/13 1142 09/02/13 1556 09/02/13 2037 09/03/13 0726  GLUCAP 120* 109* 85 89    Recent Results (from the past 240 hour(s))  MRSA PCR SCREENING     Status: None   Collection Time    09/02/13 10:55 AM      Result Value Ref Range Status   MRSA by PCR NEGATIVE  NEGATIVE Final   Comment:            The GeneXpert MRSA Assay (FDA     approved for NASAL specimens     only), is one component of a     comprehensive MRSA colonization     surveillance program. It is not     intended to diagnose MRSA     infection nor to guide or     monitor treatment for     MRSA infections.     Studies: No results found.  Scheduled Meds: . antiseptic oral rinse  7 mL Mouth Rinse BID  . folic acid  1 mg Intravenous Daily  .  gabapentin  300 mg Oral TID  . heparin  5,000 Units Subcutaneous 3 times per day  . mometasone-formoterol  2 puff Inhalation BID  . pantoprazole  40 mg Oral Daily  . PARoxetine  50 mg Oral Daily  . pneumococcal 23 valent vaccine  0.5 mL Intramuscular Tomorrow-1000  . sodium chloride  3 mL Intravenous Q12H  . thiamine  100 mg Intravenous Daily  . traMADol  50 mg Oral TID   Continuous Infusions: . dextrose 5 % and 0.45% NaCl 75 mL/hr at 09/03/13 0505     Marinda ElkFELIZ ORTIZ, Winry Egnew  [facility] Pager [phone]. If 8PM-8AM, please contact night-coverage at [email or web address], password [email or web address] 09/03/2013, 8:16 AM  LOS: 1 day    **Disclaimer: This note may have been dictated with voice recognition software. Similar sounding words can inadvertently be transcribed and this note may contain transcription errors which may not have been corrected upon publication of note.**

## 2013-09-03 NOTE — Progress Notes (Signed)
Upon 1st assessment, patient arouseable to speech, followed all commands, family in room said this was the most  They had seen all day from her.  Mild grip, bends knees, pupils PERRLA, turns side to side in bed on command.  Talking to family.  Easily falls back to sleep. Patient had not voided since brought to floor at 1000.  Bladder scanned 90cc at 1630.  115cc at 2000, and after 1 liter bolus, 114cc bladder scanned at 0001.  Foley catheter placed for urinary retention, 14 FR inserted with Dody NT at bedside, Huddle for need with 3 RN's.   Concentrated urine return of 450cc.   Will monitor closely

## 2013-09-03 NOTE — Progress Notes (Signed)
Utilization review Completed Zettie Kyndell Zeiser RN BSN   

## 2013-09-03 NOTE — Treatment Plan (Signed)
[facility] Dahlia ByesJosephine Onuoha, NP once medically cleared by attending MD.  Once pt's discharge summary has been entered, [facility].  If you have questions, call the [facility] at [phone].

## 2013-09-03 NOTE — Consult Note (Signed)
Telepsych Consultation   Reason for Consult:  Suicidal attempt , Depression Referring Physician:  ICU Physician, [facility] Rebecca [name] is an 58 y.o. female.  Assessment: AXIS I:  Major Depression, Recurrent severe and Suicide attempt with unkwon amount of Oxycodone, Ambien and Tramadol. AXIS II:  Deferred AXIS III:   Past Medical History  Diagnosis Date  . Asthma   . Depression   . Reflux    AXIS IV:  other psychosocial or environmental problems and problems related to social environment AXIS V:  41-50 serious symptoms  Plan:  Recommend psychiatric Inpatient admission when medically cleared.  Subjective:   Rebecca [name] is a 58 y.o. female patient admitted with Suicide attempt with OD of her pills.  HPI:  58 Years old caucasian female was assessed via [name] ordered by a Physician at ICU at [facility] after  Suicidal attempt by OD.  Patient still appeared drowsy and sleepy but was easily aroused.  Patient did not remember how she came to the hospital but remembered she took some pain medications.  Patient reported that this is her second time of OD on her pills.  Her first attempt was in 2004 after her separation from her former husband.  Since then she has remarried and have been doing well until late Thursday / early Friday.  Patient reported that since her outpatient Psychiatrist retired she has not been able to find a new one and have not been taking her medications regularly.  Patient also stated that she has suffered three deaths lately and have struggled to deal with the deaths.  Patient also stated that she is having difficulty dealing with her husband's disability which she did not explain to this Probation officer.  Patient reports feeling hopeless,helpless and worthless.  She reports self isolation, poor sleep and appetite and that she does not enjoy anything she used to enjoy in the past.  She reports her sleeping pattern is mixed up and that she sleeps 8  hours in the day time and 1 hour at night. She reports drinking unknown amount of Liquor mixed with Pepsi daily and stated she has never been treated for alcohol issues in her life.  She denies smoking Cigarette or using drugs.  Collateral information from Ms Rebecca [name] her daughter is that Ms Rebecca [name] took unknown amount of Tramadol, Ambien and Hydrocodone some time late Thursday or early Friday am.  Daughter says she spoke to her mother after an argument between patient and her husband on Thursday afternoon.  She spoke to her mother again and reassured her that things will be fine.  She says she went to bed and was called Friday morning to come to the hospital.  Ms Rebecca [name] stated that her mother, MS Rebecca [name] is having difficulty dealing with deaths of her Aunts and a dear friend.  She recently lost her dog  and is not doing well with this too. We will admit patient to our [name] 500 hall for treatment of her depression.  She will be transferred when bed is available.  Patient denied SI/HI/AVH.  She reports occasional auditory visual and auditory hallucination of which she takes Abilify HPI Elements:   Location:  Major Depressive disorder, recurrent severe, Suicidal attempt. Quality:  Hopeless, helpless,worthless.  Complicated grieving, isolative, non motivation. Timing:  since Thursday this week. Context:  increased stressors, death of friend and relative and dog.  Past Psychiatric History: Past Medical History  Diagnosis Date  . Asthma   .  Depression   . Reflux     reports that she has quit smoking. Her smoking use included Cigarettes. She smoked 0.00 packs per day. She does not have any smokeless tobacco history on file. She reports that she drinks about 1.2 ounces of alcohol per week. She reports that she does not use illicit drugs. History reviewed. No pertinent family history.   Living Arrangements: Spouse/significant other   Allergies:   Allergies  Allergen Reactions  . Sulfa Antibiotics  Other (See Comments)    unknown    ACT Assessment Complete:  Yes:    Educational Status    Risk to Self: Risk to self with the past 6 months Is patient at risk for suicide?: Yes  Risk to Others:    Abuse:    Prior Inpatient Therapy:    Prior Outpatient Therapy:    Additional Information:      Objective: Blood pressure 103/83, pulse 94, temperature 96.7 F (35.9 C), temperature source Axillary, resp. rate 19, height '5\' 5"'  (1.651 m), weight 127.6 kg (281 lb 4.9 oz), SpO2 95.00%.Body mass index is 46.81 kg/(m^2). Results for orders placed during the hospital encounter of 09/02/13 (from the past 72 hour(s))  URINE RAPID DRUG SCREEN (HOSP PERFORMED)     Status: Abnormal   Collection Time    09/02/13  8:23 AM      Result Value Ref Range   Opiates POSITIVE (*) NONE DETECTED   Cocaine NONE DETECTED  NONE DETECTED   Benzodiazepines NONE DETECTED  NONE DETECTED   Amphetamines NONE DETECTED  NONE DETECTED   Tetrahydrocannabinol NONE DETECTED  NONE DETECTED   Barbiturates NONE DETECTED  NONE DETECTED   Comment:            DRUG SCREEN FOR MEDICAL PURPOSES     ONLY.  IF CONFIRMATION IS NEEDED     FOR ANY PURPOSE, NOTIFY LAB     WITHIN 5 DAYS.                LOWEST DETECTABLE LIMITS     FOR URINE DRUG SCREEN     Drug Class       Cutoff (ng/mL)     Amphetamine      1000     Barbiturate      200     Benzodiazepine   852     Tricyclics       778     Opiates          300     Cocaine          300     THC              50  BLOOD GAS, VENOUS     Status: Abnormal   Collection Time    09/02/13  8:35 AM      Result Value Ref Range   pH, Ven 7.307 (*) 7.250 - 7.300   pCO2, Ven 54.2 (*) 45.0 - 50.0 mmHg   pO2, Ven 46.2 (*) 30.0 - 45.0 mmHg   Bicarbonate 26.3 (*) 20.0 - 24.0 mEq/L   TCO2 24.1  0 - 100 mmol/L   Acid-Base Excess 0.7  0.0 - 2.0 mmol/L   O2 Saturation 78.4     Collection site VEIN     Drawn by COLLECTED BY LABORATORY     Sample type VENOUS    COMPREHENSIVE METABOLIC PANEL      Status: Abnormal   Collection Time    09/02/13  8:41 AM  Result Value Ref Range   Sodium 139  137 - 147 mEq/L   Potassium 5.2  3.7 - 5.3 mEq/L   Chloride 102  96 - 112 mEq/L   CO2 27  19 - 32 mEq/L   Glucose, Bld 115 (*) 70 - 99 mg/dL   BUN 18  6 - 23 mg/dL   Creatinine, Ser 0.88  0.50 - 1.10 mg/dL   Calcium 9.1  8.4 - 10.5 mg/dL   Total Protein 7.2  6.0 - 8.3 g/dL   Albumin 3.5  3.5 - 5.2 g/dL   AST 22  0 - 37 U/L   ALT 24  0 - 35 U/L   Alkaline Phosphatase 119 (*) 39 - 117 U/L   Total Bilirubin 0.2 (*) 0.3 - 1.2 mg/dL   GFR calc non Af Amer 72 (*) >90 mL/min   GFR calc Af Amer 83 (*) >90 mL/min   Comment: (NOTE)     The eGFR has been calculated using the CKD EPI equation.     This calculation has not been validated in all clinical situations.     eGFR's persistently <90 mL/min signify possible Chronic Kidney     Disease.   Anion gap 10  5 - 15  CBC WITH DIFFERENTIAL     Status: Abnormal   Collection Time    09/02/13  8:41 AM      Result Value Ref Range   WBC 8.8  4.0 - 10.5 K/uL   RBC 4.53  3.87 - 5.11 MIL/uL   Hemoglobin 13.0  12.0 - 15.0 g/dL   HCT 40.4  36.0 - 46.0 %   MCV 89.2  78.0 - 100.0 fL   MCH 28.7  26.0 - 34.0 pg   MCHC 32.2  30.0 - 36.0 g/dL   RDW 15.7 (*) 11.5 - 15.5 %   Platelets 384  150 - 400 K/uL   Neutrophils Relative % 60  43 - 77 %   Neutro Abs 5.3  1.7 - 7.7 K/uL   Lymphocytes Relative 25  12 - 46 %   Lymphs Abs 2.2  0.7 - 4.0 K/uL   Monocytes Relative 6  3 - 12 %   Monocytes Absolute 0.5  0.1 - 1.0 K/uL   Eosinophils Relative 7 (*) 0 - 5 %   Eosinophils Absolute 0.6  0.0 - 0.7 K/uL   Basophils Relative 2 (*) 0 - 1 %   Basophils Absolute 0.1  0.0 - 0.1 K/uL  ACETAMINOPHEN LEVEL     Status: None   Collection Time    09/02/13  8:41 AM      Result Value Ref Range   Acetaminophen (Tylenol), Serum <15.0  10 - 30 ug/mL   Comment:            THERAPEUTIC CONCENTRATIONS VARY     SIGNIFICANTLY. A RANGE OF 10-30     ug/mL MAY BE AN EFFECTIVE      CONCENTRATION FOR MANY PATIENTS.     HOWEVER, SOME ARE BEST TREATED     AT CONCENTRATIONS OUTSIDE THIS     RANGE.     ACETAMINOPHEN CONCENTRATIONS     >150 ug/mL AT 4 HOURS AFTER     INGESTION AND >50 ug/mL AT 12     HOURS AFTER INGESTION ARE     OFTEN ASSOCIATED WITH TOXIC     REACTIONS.  SALICYLATE LEVEL     Status: Abnormal   Collection Time    09/02/13  8:41 AM  Result Value Ref Range   Salicylate Lvl <6.2 (*) 2.8 - 20.0 mg/dL  ETHANOL     Status: None   Collection Time    09/02/13  8:41 AM      Result Value Ref Range   Alcohol, Ethyl (B) <11  0 - 11 mg/dL   Comment:            LOWEST DETECTABLE LIMIT FOR     SERUM ALCOHOL IS 11 mg/dL     FOR MEDICAL PURPOSES ONLY  MRSA PCR SCREENING     Status: None   Collection Time    09/02/13 10:55 AM      Result Value Ref Range   MRSA by PCR NEGATIVE  NEGATIVE   Comment:            The GeneXpert MRSA Assay (FDA     approved for NASAL specimens     only), is one component of a     comprehensive MRSA colonization     surveillance program. It is not     intended to diagnose MRSA     infection nor to guide or     monitor treatment for     MRSA infections.  GLUCOSE, CAPILLARY     Status: Abnormal   Collection Time    09/02/13 11:42 AM      Result Value Ref Range   Glucose-Capillary 120 (*) 70 - 99 mg/dL   Comment 1 Documented in Chart     Comment 2 Notify RN    GLUCOSE, CAPILLARY     Status: Abnormal   Collection Time    09/02/13  3:56 PM      Result Value Ref Range   Glucose-Capillary 109 (*) 70 - 99 mg/dL   Comment 1 Documented in Chart     Comment 2 Notify RN    GLUCOSE, CAPILLARY     Status: None   Collection Time    09/02/13  8:37 PM      Result Value Ref Range   Glucose-Capillary 85  70 - 99 mg/dL   Comment 1 Notify RN    COMPREHENSIVE METABOLIC PANEL     Status: Abnormal   Collection Time    09/03/13  5:16 AM      Result Value Ref Range   Sodium 139  137 - 147 mEq/L   Potassium 4.7  3.7 - 5.3 mEq/L    Chloride 107  96 - 112 mEq/L   CO2 22  19 - 32 mEq/L   Glucose, Bld 91  70 - 99 mg/dL   BUN 20  6 - 23 mg/dL   Creatinine, Ser 0.84  0.50 - 1.10 mg/dL   Calcium 7.9 (*) 8.4 - 10.5 mg/dL   Total Protein 6.6  6.0 - 8.3 g/dL   Albumin 3.1 (*) 3.5 - 5.2 g/dL   AST 36  0 - 37 U/L   ALT 24  0 - 35 U/L   Alkaline Phosphatase 107  39 - 117 U/L   Total Bilirubin 0.3  0.3 - 1.2 mg/dL   GFR calc non Af Amer 76 (*) >90 mL/min   GFR calc Af Amer 88 (*) >90 mL/min   Comment: (NOTE)     The eGFR has been calculated using the CKD EPI equation.     This calculation has not been validated in all clinical situations.     eGFR's persistently <90 mL/min signify possible Chronic Kidney     Disease.   Anion gap 10  5 - 15  CBC     Status: Abnormal   Collection Time    09/03/13  5:16 AM      Result Value Ref Range   WBC 9.1  4.0 - 10.5 K/uL   RBC 3.95  3.87 - 5.11 MIL/uL   Hemoglobin 11.3 (*) 12.0 - 15.0 g/dL   HCT 36.9  36.0 - 46.0 %   MCV 93.4  78.0 - 100.0 fL   MCH 28.6  26.0 - 34.0 pg   MCHC 30.6  30.0 - 36.0 g/dL   RDW 16.3 (*) 11.5 - 15.5 %   Platelets 365  150 - 400 K/uL  GLUCOSE, CAPILLARY     Status: None   Collection Time    09/03/13  7:26 AM      Result Value Ref Range   Glucose-Capillary 89  70 - 99 mg/dL   Comment 1 Documented in Chart     Comment 2 Notify RN    GLUCOSE, CAPILLARY     Status: None   Collection Time    09/03/13 11:04 AM      Result Value Ref Range   Glucose-Capillary 94  70 - 99 mg/dL   Comment 1 Documented in Chart     Comment 2 Notify RN     Labs are reviewed and are pertinent for UDS positive for Opiates, some abnormal values that are being corrected.  Current Facility-Administered Medications  Medication Dose Route Frequency Provider Last Rate Last Dose  . albuterol (PROVENTIL) (2.5 MG/3ML) 0.083% nebulizer solution 2.5 mg  2.5 mg Nebulization Q4H PRN Charlynne Cousins, MD   2.5 mg at 09/03/13 3329  . antiseptic oral rinse (CPC / CETYLPYRIDINIUM  CHLORIDE 0.05%) solution 7 mL  7 mL Mouth Rinse BID Charlynne Cousins, MD   7 mL at 09/03/13 0948  . dextrose 5 %-0.45 % sodium chloride infusion   Intravenous Continuous Charlynne Cousins, MD 75 mL/hr at 09/03/13 1033    . folic acid injection 1 mg  1 mg Intravenous Daily Charlynne Cousins, MD   1 mg at 09/03/13 5188  . gabapentin (NEURONTIN) capsule 300 mg  300 mg Oral TID Charlynne Cousins, MD   300 mg at 09/03/13 1000  . heparin injection 5,000 Units  5,000 Units Subcutaneous 3 times per day Charlynne Cousins, MD   5,000 Units at 09/02/13 2219  . hydrALAZINE (APRESOLINE) injection 5 mg  5 mg Intravenous Q6H PRN Charlynne Cousins, MD      . mometasone-formoterol [facility]) 100-5 MCG/ACT inhaler 2 puff  2 puff Inhalation BID Charlynne Cousins, MD   2 puff at 09/03/13 [phone]  . ondansetron (ZOFRAN) tablet 4 mg  4 mg Oral Q6H PRN Charlynne Cousins, MD       Or  . ondansetron [facility]) injection 4 mg  4 mg Intravenous Q6H PRN Charlynne Cousins, MD      . pantoprazole (PROTONIX) EC tablet 40 mg  40 mg Oral Daily Charlynne Cousins, MD   40 mg at 09/03/13 1000  . PARoxetine (PAXIL-CR) 24 hr tablet 50 mg  50 mg Oral Daily Charlynne Cousins, MD   50 mg at 09/03/13 1000  . pneumococcal 23 valent vaccine (PNU-IMMUNE) injection 0.5 mL  0.5 mL Intramuscular Tomorrow-1000 Charlynne Cousins, MD      . polyethylene glycol [facility] Floria Raveling) packet 17 g  17 g Oral Daily PRN Charlynne Cousins, MD      . sodium chloride  0.9 % injection 3 mL  3 mL Intravenous Q12H Charlynne Cousins, MD   3 mL at 09/03/13 [phone]  . thiamine (B-1) injection 100 mg  100 mg Intravenous Daily Charlynne Cousins, MD   100 mg at 09/03/13 4868  . traMADol (ULTRAM) tablet 50 mg  50 mg Oral TID Charlynne Cousins, MD   50 mg at 09/03/13 1000    Psychiatric Specialty Exam:     Blood pressure 103/83, pulse 94, temperature 96.7 F (35.9 C), temperature source Axillary, resp. rate 19, height '5\' 5"'  (1.651 m), weight  127.6 kg (281 lb 4.9 oz), SpO2 95.00%.Body mass index is 46.81 kg/(m^2).  General Appearance: Casual and Fairly Groomed  Engineer, water::  Fair  Speech:  Garbled  Volume:  Normal  Mood:  Anxious, Depressed, Hopeless, Worthless and helpless  Affect:  Congruent, Depressed, Flat and Tearful  Thought Process:  Coherent, Goal Directed and Intact  Orientation:  Full (Time, Place, and Person)  Thought Content:  WDL  Suicidal Thoughts:  No  Homicidal Thoughts:  No  Memory:  Immediate;   Fair Recent;   Fair Remote;   Fair  Judgement:  Poor  Insight:  Good  Psychomotor Activity:  Normal  Concentration:  Fair  Recall:  NA  Akathisia:  NA  Handed:  Right  AIMS (if indicated):     Assets:  Desire for Improvement  Sleep:      Treatment Plan Summary:  Consult with Dr Darleene Cleaver who agrees that patient need inpatient Psychiatric hospitalization. Medication Management  Disposition:  Accepted to [facility] UNIT FOR TREATMENT OF DEPRESSION.  Writer informed Dr  Rejeana Brock that patient have been accepted for admission to our [facility].  Charmaine Downs, C   PMHNP-BC 09/03/2013 1:50 PM  Patient seen, evaluated and I agree with notes by Nurse Practitioner. Corena Pilgrim, MD

## 2013-09-03 NOTE — Progress Notes (Signed)
Pt transferring upstairs to [address]. Pt/family are aware of transfer. All belongings transferred with pt. Pt stable at time of transfer.

## 2013-09-03 NOTE — Progress Notes (Signed)
BH CONTACTED FOR PSY EVAL.

## 2013-09-03 NOTE — Progress Notes (Signed)
Telepsych consult at this time. Rebecca [name], Rebecca [name]

## 2013-09-04 ENCOUNTER — Inpatient Hospital Stay (HOSPITAL_COMMUNITY)
Admission: AD | Admit: 2013-09-04 | Discharge: 2013-09-08 | DRG: 885 | Disposition: A | Discharge status: Home / Self Care | Payer: 59 | Source: Intra-hospital | Attending: Psychiatry | Admitting: Psychiatry

## 2013-09-04 ENCOUNTER — Encounter (HOSPITAL_COMMUNITY): Payer: Self-pay | Admitting: *Deleted

## 2013-09-04 DIAGNOSIS — Z87891 Personal history of nicotine dependence: Secondary | ICD-10-CM | POA: Diagnosis not present

## 2013-09-04 DIAGNOSIS — F411 Generalized anxiety disorder: Secondary | ICD-10-CM | POA: Diagnosis present

## 2013-09-04 DIAGNOSIS — R45851 Suicidal ideations: Secondary | ICD-10-CM

## 2013-09-04 DIAGNOSIS — E66813 Obesity, class 3: Secondary | ICD-10-CM | POA: Diagnosis present

## 2013-09-04 DIAGNOSIS — R0601 Orthopnea: Secondary | ICD-10-CM | POA: Diagnosis not present

## 2013-09-04 DIAGNOSIS — T50902A Poisoning by unspecified drugs, medicaments and biological substances, intentional self-harm, initial encounter: Secondary | ICD-10-CM

## 2013-09-04 DIAGNOSIS — K219 Gastro-esophageal reflux disease without esophagitis: Secondary | ICD-10-CM | POA: Diagnosis present

## 2013-09-04 DIAGNOSIS — J45909 Unspecified asthma, uncomplicated: Secondary | ICD-10-CM | POA: Diagnosis present

## 2013-09-04 DIAGNOSIS — J4530 Mild persistent asthma, uncomplicated: Secondary | ICD-10-CM

## 2013-09-04 DIAGNOSIS — F332 Major depressive disorder, recurrent severe without psychotic features: Principal | ICD-10-CM | POA: Diagnosis present

## 2013-09-04 DIAGNOSIS — G4700 Insomnia, unspecified: Secondary | ICD-10-CM | POA: Diagnosis present

## 2013-09-04 DIAGNOSIS — R609 Edema, unspecified: Secondary | ICD-10-CM | POA: Diagnosis not present

## 2013-09-04 DIAGNOSIS — I809 Phlebitis and thrombophlebitis of unspecified site: Secondary | ICD-10-CM

## 2013-09-04 DIAGNOSIS — F333 Major depressive disorder, recurrent, severe with psychotic symptoms: Secondary | ICD-10-CM

## 2013-09-04 DIAGNOSIS — R600 Localized edema: Secondary | ICD-10-CM

## 2013-09-04 DIAGNOSIS — F329 Major depressive disorder, single episode, unspecified: Secondary | ICD-10-CM | POA: Diagnosis present

## 2013-09-04 LAB — GLUCOSE, CAPILLARY
GLUCOSE-CAPILLARY: 75 mg/dL (ref 70–99)
Glucose-Capillary: 115 mg/dL — ABNORMAL HIGH (ref 70–99)
Glucose-Capillary: 122 mg/dL — ABNORMAL HIGH (ref 70–99)
Glucose-Capillary: 49 mg/dL — ABNORMAL LOW (ref 70–99)
Glucose-Capillary: 56 mg/dL — ABNORMAL LOW (ref 70–99)
Glucose-Capillary: 86 mg/dL (ref 70–99)

## 2013-09-04 MED ORDER — ACETAMINOPHEN 325 MG PO TABS
650.0000 mg | ORAL_TABLET | ORAL | Status: DC | PRN
  Administered 2013-09-04: 650 mg via ORAL
  Filled 2013-09-04: qty 2

## 2013-09-04 MED ORDER — GABAPENTIN 300 MG PO CAPS
300.0000 mg | ORAL_CAPSULE | Freq: Two times a day (BID) | ORAL | Status: DC
  Administered 2013-09-04 – 2013-09-08 (×8): 300 mg via ORAL
  Filled 2013-09-04: qty 1
  Filled 2013-09-04: qty 8
  Filled 2013-09-04: qty 1
  Filled 2013-09-04: qty 8
  Filled 2013-09-04 (×10): qty 1

## 2013-09-04 MED ORDER — TRAZODONE HCL 50 MG PO TABS
50.0000 mg | ORAL_TABLET | Freq: Every evening | ORAL | Status: DC | PRN
Start: 2013-09-04 — End: 2013-09-08
  Administered 2013-09-04 – 2013-09-07 (×7): 50 mg via ORAL
  Filled 2013-09-04 (×4): qty 1
  Filled 2013-09-04: qty 8
  Filled 2013-09-04 (×2): qty 1

## 2013-09-04 MED ORDER — MOMETASONE FURO-FORMOTEROL FUM 100-5 MCG/ACT IN AERO
2.0000 | INHALATION_SPRAY | Freq: Two times a day (BID) | RESPIRATORY_TRACT | Status: DC
  Administered 2013-09-04 – 2013-09-08 (×8): 2 via RESPIRATORY_TRACT
  Filled 2013-09-04 (×2): qty 8.8

## 2013-09-04 MED ORDER — ACETAMINOPHEN 325 MG PO TABS
650.0000 mg | ORAL_TABLET | Freq: Four times a day (QID) | ORAL | Status: DC | PRN
  Administered 2013-09-04 – 2013-09-07 (×6): 650 mg via ORAL
  Filled 2013-09-04 (×6): qty 2

## 2013-09-04 MED ORDER — HYDROXYZINE HCL 25 MG PO TABS
25.0000 mg | ORAL_TABLET | Freq: Four times a day (QID) | ORAL | Status: DC | PRN
  Administered 2013-09-04 – 2013-09-07 (×5): 25 mg via ORAL
  Filled 2013-09-04 (×5): qty 1
  Filled 2013-09-04: qty 12

## 2013-09-04 MED ORDER — MAGNESIUM HYDROXIDE 400 MG/5ML PO SUSP: 30.0000 mL | Freq: Every day | ORAL | Status: DC | PRN

## 2013-09-04 MED ORDER — DIPHENHYDRAMINE HCL 25 MG PO CAPS
25.0000 mg | ORAL_CAPSULE | Freq: Every evening | ORAL | Status: DC | PRN
  Filled 2013-09-04: qty 1

## 2013-09-04 MED ORDER — ROPINIROLE HCL 1 MG PO TABS
ORAL_TABLET | ORAL | Status: AC
  Filled 2013-09-04: qty 1

## 2013-09-04 MED ORDER — IBUPROFEN 600 MG PO TABS
600.0000 mg | ORAL_TABLET | Freq: Four times a day (QID) | ORAL | Status: DC | PRN
  Administered 2013-09-04 – 2013-09-08 (×11): 600 mg via ORAL
  Filled 2013-09-04 (×8): qty 1
  Filled 2013-09-04: qty 12
  Filled 2013-09-04 (×3): qty 1

## 2013-09-04 MED ORDER — ROPINIROLE HCL 0.5 MG PO TABS
0.5000 mg | ORAL_TABLET | Freq: Once | ORAL | Status: AC
  Administered 2013-09-04: 0.5 mg via ORAL
  Filled 2013-09-04: qty 1

## 2013-09-04 MED ORDER — PANTOPRAZOLE SODIUM 40 MG PO TBEC
40.0000 mg | DELAYED_RELEASE_TABLET | Freq: Every day | ORAL | Status: DC
  Administered 2013-09-05 – 2013-09-08 (×4): 40 mg via ORAL
  Filled 2013-09-04 (×3): qty 1
  Filled 2013-09-04: qty 4
  Filled 2013-09-04 (×3): qty 1

## 2013-09-04 MED ORDER — IBUPROFEN 600 MG PO TABS
600.0000 mg | ORAL_TABLET | Freq: Four times a day (QID) | ORAL | Status: DC | PRN
  Administered 2013-09-04: 600 mg via ORAL
  Filled 2013-09-04: qty 1

## 2013-09-04 MED ORDER — ARIPIPRAZOLE 5 MG PO TABS
5.0000 mg | ORAL_TABLET | Freq: Every day | ORAL | Status: DC
  Administered 2013-09-05 – 2013-09-08 (×4): 5 mg via ORAL
  Filled 2013-09-04: qty 1
  Filled 2013-09-04: qty 4
  Filled 2013-09-04 (×6): qty 1

## 2013-09-04 MED ORDER — PAROXETINE HCL ER 25 MG PO TB24
50.0000 mg | ORAL_TABLET | Freq: Every day | ORAL | Status: DC
  Administered 2013-09-05 – 2013-09-06 (×2): 50 mg via ORAL
  Filled 2013-09-04 (×4): qty 2

## 2013-09-04 MED ORDER — ROPINIROLE HCL 0.5 MG PO TABS
0.5000 mg | ORAL_TABLET | Freq: Every day | ORAL | Status: DC
  Administered 2013-09-04 – 2013-09-07 (×4): 0.5 mg via ORAL
  Filled 2013-09-04: qty 1
  Filled 2013-09-04: qty 2
  Filled 2013-09-04: qty 1
  Filled 2013-09-04: qty 4
  Filled 2013-09-04 (×3): qty 1

## 2013-09-04 MED ORDER — ALUM & MAG HYDROXIDE-SIMETH 200-200-20 MG/5ML PO SUSP: 30.0000 mL | ORAL | Status: DC | PRN

## 2013-09-04 NOTE — Discharge Summary (Signed)
Physician Discharge Summary  Rebecca [name] UJW:[id number] DOB: 1955-10-28 DOA: 09/02/2013  PCP: Rebecca GrosserPICKARD,WARREN TOM, MD  Admit date: 09/02/2013 Discharge date: 09/04/2013  Time spent: 40 minutes  Recommendations for Outpatient Follow-up:  1. [facility]  Discharge Diagnoses:  Principal Problem:   Drug overdose, intentional Active Problems:   Intentional drug overdose   Morbid obesity   Discharge Condition: stable  Diet recommendation: Carb modified  Filed Weights   09/02/13 1100 09/03/13 0500  Weight: 124.4 kg (274 lb 4 oz) 127.6 kg (281 lb 4.9 oz)    History of present illness:  58 y.o. female  58 year old female with past medical history of suicide attempt more than 2 years ago brought by EMS for an intentional overdose sometime around 12 AM on the morning of admission.    Hospital Course:  Drug overdose, intentional: - Was given Narcan in ED, pt woke up. - evaluated by psyq, rec inpatient psyq facility. - pt was very emotional.   Morbid obesity  - counseling.   Procedures:  CT head  Consultations:  psyq.  Discharge Exam: Filed Vitals:   09/04/13 0607  BP: 129/59  Pulse: 79  Temp: 98.2 F (36.8 C)  Resp:     General: A&O x3 Cardiovascular: RRR Respiratory: good air movement CTA B/L  Discharge Instructions You were cared for by a hospitalist during your hospital stay. If you have any questions about your discharge medications or the care you received while you were in the hospital after you are discharged, you can call the unit and asked to speak with the hospitalist on call if the hospitalist that took care of you is not available. Once you are discharged, your primary care physician will handle any further medical issues. Please note that NO REFILLS for any discharge medications will be authorized once you are discharged, as it is imperative that you return to your primary care physician (or establish a relationship with a primary care  physician if you do not have one) for your aftercare needs so that they can reassess your need for medications and monitor your lab values.      Discharge Instructions   Diet - low sodium heart healthy    Complete by:  As directed      Increase activity slowly    Complete by:  As directed             Medication List         ALPRAZolam 0.5 MG tablet  Commonly known as:  XANAX  Take 0.25-0.5 mg by mouth 2 (two) times daily as needed for anxiety.     ARIPiprazole 5 MG tablet  Commonly known as:  ABILIFY  Take 5 mg by mouth daily.     fluticasone-salmeterol 115-21 MCG/ACT inhaler  Commonly known as:  ADVAIR HFA  Inhale 2 puffs into the lungs 2 (two) times daily.     gabapentin 300 MG capsule  Commonly known as:  NEURONTIN  Take 300 mg by mouth 2 (two) times daily.     HYDROcodone-acetaminophen 7.5-325 MG per tablet  Commonly known as:  NORCO  Take 1-2 tablets by mouth every 4 (four) hours as needed for moderate pain.     ibuprofen 200 MG tablet  Commonly known as:  ADVIL,MOTRIN  Take 600 mg by mouth every 6 (six) hours as needed for mild pain.     pantoprazole 40 MG tablet  Commonly known as:  PROTONIX  Take 40 mg by mouth daily.  PARoxetine 25 MG 24 hr tablet  Commonly known as:  PAXIL-CR  Take 50 mg by mouth daily.     rOPINIRole 0.5 MG tablet  Commonly known as:  REQUIP  Take 0.5 mg by mouth at bedtime.     traMADol 50 MG tablet  Commonly known as:  ULTRAM  Take 50 mg by mouth 3 (three) times daily as needed.     zolpidem 10 MG tablet  Commonly known as:  AMBIEN  Take 10 mg by mouth at bedtime.       Allergies  Allergen Reactions  . Sulfa Antibiotics Other (See Comments)    unknown      The results of significant diagnostics from this hospitalization (including imaging, microbiology, ancillary and laboratory) are listed below for reference.    Significant Diagnostic Studies: No results found.  Microbiology: Recent Results (from the past  240 hour(s))  MRSA PCR SCREENING     Status: None   Collection Time    09/02/13 10:55 AM      Result Value Ref Range Status   MRSA by PCR NEGATIVE  NEGATIVE Final   Comment:            The GeneXpert MRSA Assay (FDA     approved for NASAL specimens     only), is one component of a     comprehensive MRSA colonization     surveillance program. It is not     intended to diagnose MRSA     infection nor to guide or     monitor treatment for     MRSA infections.     Labs: Basic Metabolic Panel:  Recent Labs Lab 09/02/13 0841 09/03/13 0516  NA 139 139  K 5.2 4.7  CL 102 107  CO2 27 22  GLUCOSE 115* 91  BUN 18 20  CREATININE 0.88 0.84  CALCIUM 9.1 7.9*   Liver Function Tests:  Recent Labs Lab 09/02/13 0841 09/03/13 0516  AST 22 36  ALT 24 24  ALKPHOS 119* 107  BILITOT 0.2* 0.3  PROT 7.2 6.6  ALBUMIN 3.5 3.1*   No results found for this basename: LIPASE, AMYLASE,  in the last 168 hours No results found for this basename: AMMONIA,  in the last 168 hours CBC:  Recent Labs Lab 09/02/13 0841 09/03/13 0516  WBC 8.8 9.1  NEUTROABS 5.3  --   HGB 13.0 11.3*  HCT 40.4 36.9  MCV 89.2 93.4  PLT 384 365   Cardiac Enzymes: No results found for this basename: CKTOTAL, CKMB, CKMBINDEX, TROPONINI,  in the last 168 hours BNP: BNP (last 3 results) No results found for this basename: PROBNP,  in the last 8760 hours CBG:  Recent Labs Lab 09/04/13 0027 09/04/13 0029 09/04/13 0040 09/04/13 0113 09/04/13 0804  GLUCAP 49* 56* 75 122* 86       Signed:  FELIZ ORTIZ, Chasty Randal  [facility] 09/04/2013, 8:50 AM

## 2013-09-04 NOTE — Progress Notes (Signed)
Late entry for this afternoon: [facility]. Voluntary [facility]. Report called to DillardShalita, [facility]. [facility]. Family is aware of move and will be following pt there. Medications were returned to daughter prior to d/c. IV and foley d/c without complications. Pt voided prior to d/c. Sheryn BisonGordon, Maximillion Gill Warner

## 2013-09-04 NOTE — Tx Team (Signed)
Initial Interdisciplinary Treatment Plan   PATIENT STRESSORS: Marital or family conflict Substance abuse   PROBLEM LIST: Problem List/Patient Goals Date to be addressed Date deferred Reason deferred Estimated date of resolution  Depression  09/04/13           Substance Abuse 09/04/13                                          DISCHARGE CRITERIA:  Improved stabilization in mood, thinking, and/or behavior  PRELIMINARY DISCHARGE PLAN: Placement in alternative living arrangements  PATIENT/FAMIILY INVOLVEMENT: This treatment plan has been presented to and reviewed with the patient, Rebecca [name], and/or family member.  The patient and family have been given the opportunity to ask questions and make suggestions.  Jacquelyne BalintForrest, Lempi Edwin Shanta 09/04/2013, 6:37 PM

## 2013-09-04 NOTE — Progress Notes (Signed)
[facility] Group Notes:  (Nursing/MHT/Case Management/Adjunct)  Date:  09/04/2013  Time:  9:51 PM  Type of Therapy:  Group Therapy  Participation Level:  Minimal  Participation Quality:  Appropriate  Affect:  Appropriate  Cognitive:  Alert  Insight:  Good  Engagement in Group:  Engaged  Modes of Intervention:  Discussion  Summary of Progress/Problems: Patient says that her day started off a little down but got better as the day went on. Pt says that being admitted here has made her day better.  [facility] Center[facility]NSON,TAWANA 09/04/2013, 9:51 PM

## 2013-09-04 NOTE — Progress Notes (Signed)
D.  Pt pleasant on approach, denies complaints other than allergy symptoms that she states are worse at night.  Positive for evening wrap up group, interacting appropriately with peers on unit.  Pt is used to taking Ambien for sleep but was willing to try the Trazodone as ordered tonight.  Denies SI/HI/hallucinations at this time.  A.  Support and encouragement offered  R.  Pt remains safe on unit, will continue to monitor.

## 2013-09-05 ENCOUNTER — Encounter (HOSPITAL_COMMUNITY): Payer: Self-pay | Admitting: Psychiatry

## 2013-09-05 DIAGNOSIS — F332 Major depressive disorder, recurrent severe without psychotic features: Principal | ICD-10-CM

## 2013-09-05 DIAGNOSIS — I809 Phlebitis and thrombophlebitis of unspecified site: Secondary | ICD-10-CM

## 2013-09-05 DIAGNOSIS — F333 Major depressive disorder, recurrent, severe with psychotic symptoms: Secondary | ICD-10-CM

## 2013-09-05 MED ORDER — BUPROPION HCL ER (XL) 150 MG PO TB24
150.0000 mg | ORAL_TABLET | Freq: Every morning | ORAL | Status: DC
  Administered 2013-09-05 – 2013-09-08 (×4): 150 mg via ORAL
  Filled 2013-09-05 (×6): qty 1
  Filled 2013-09-05: qty 4
  Filled 2013-09-05: qty 1

## 2013-09-05 NOTE — Progress Notes (Signed)
Adult Psychoeducational Group Note  Date:  09/05/2013 Time:  9:46 PM  Group Topic/Focus:  Wrap-Up Group:   The focus of this group is to help patients review their daily goal of treatment and discuss progress on daily workbooks.  Participation Level:  Active  Participation Quality:  Appropriate  Affect:  Appropriate  Cognitive:  Appropriate  Insight: Appropriate  Engagement in Group:  Engaged  Modes of Intervention:  Support  Additional Comments:  Pt stated that positive thing that happened was that she had a good visit from her daughter and her husband and that she considers them both to be her support system. Pt was given support to continue to use support systems even after she leaves hospital   Pricella Gaugh 09/05/2013, 9:46 PM

## 2013-09-05 NOTE — Progress Notes (Addendum)
D:  Patient's self inventory sheet, patient had poor sleep last night, sleep medication was helpful.  Fair appetite, low energy level, poor concentration.  Rated depression and hopeless 8, anxiety 5.  Deneie withdrawals.  Denied SI.  Physical problems, has back pain, slight headache, edema lower extremities, R antecubital rash where IV tape was while she was in the hospital.   Worst pain #10 in past 24 hours.  Pain medication is helpful.  Plans to stay out of bed today.  Plans to take frequent walks.  No discharge plans.  A:  Medications administered per MD orders.  Emotional support and encouragement given patient. R:  Denied SI and HI.  Denied A/V hallucinations.  Safety maintained with 15 minute checks.  1630  MD from hospital visited patient.  Stated her right arm was swollen caused by IV.  MD stated patient should elevate her arm at night, use alternating warm/cold packs.
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
## 2013-09-05 NOTE — B[facility] Counselor (Signed)
Adult Comprehensive Assessment  Patient ID: Rebecca [name], female   DOB: 07-01-1955, 58 y.o.   MRN: [id number]  Information Source: Information source: Patient  Current Stressors:  Educational / Learning stressors: None Employment / Job issues: None Family Relationships: Not getting along well with her parents Surveyor, quantityinancial / Lack of resources (include bankruptcy): None Housing / Lack of housing: None Physical health (include injuries & life threatening diseases): Asthma Social relationships: None Substance abuse: Patient reports drinking two ounce of liquor nightly Bereavement / Loss: Two aunts died last fall and best friend died February 2015.  She also reports her dog died  October 2014.  Living/Environment/Situation:  Living Arrangements: Spouse/significant other Living conditions (as described by patient or guardian): Very good How long has patient lived in current situation?: Several Years What is atmosphere in current home: Comfortable;Loving;Supportive  Family History:  Marital status: Married Number of Years Married: 8 What types of issues is patient dealing with in the relationship?: None Additional relationship information: N/A Does patient have children?: Yes How many children?: 1 How is patient's relationship with their children?: Great  Childhood History:  By whom was/is the patient raised?: Both parents Additional childhood history information: Excellent Description of patient's relationship with caregiver when they were a child: Very close to father - some problems with mother Patient's description of current relationship with people who raised him/her: Strained with both parents Does patient have siblings?: Yes Number of Siblings: 1 Description of patient's current relationship with siblings: Distant due to sister have an affair with ex-husband while they were married Did patient suffer any verbal/emotional/physical/sexual abuse as a child?: No Did patient  suffer from severe childhood neglect?: No Has patient ever been sexually abused/assaulted/raped as an adolescent or adult?: No Was the patient ever a victim of a crime or a disaster?: No Witnessed domestic violence?: No Has patient been effected by domestic violence as an adult?: No  Education:  Highest grade of school patient has completed: Four years Currently a Consulting civil engineerstudent?: No Learning disability?: No  Employment/Work Situation:   Employment situation: Employed 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, [employer] 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 Patient's job has been impacted by current illness: No What is the longest time patient has a held a job?: 20 yers Where was the patient employed at that time?: [employer] Has patient ever been in the [employer] and Companymilitary?: No Has patient ever served in combat?: No  Financial Resources:   Financial resources: Income from employment Does patient have a representative payee or guardian?: No  Alcohol/Substance Abuse:   If attempted suicide, did drugs/alcohol play a role in this?: No Alcohol/Substance Abuse Treatment Hx: Denies past history Has alcohol/substance abuse ever caused legal problems?: No  Social Support System:   Patient's Community Support System: Good Describe Community Support System: [place] Type of faith/religion: Christian How does patient's faith help to cope with current illness?: Prayer  Leisure/Recreation:   Leisure and Hobbies: Patient reports having three horses and raising chickens  Strengths/Needs:   What things does the patient do well?: Good communicator In what areas does patient struggle / problems for patient: Weight  Discharge Plan:   Does patient have access to transportation?: Yes Will patient be returning to same living situation after discharge?: Yes Currently receiving community mental health services: No If no, would patient like referral for services when discharged?: Yes (What county?)  [facility] [place] Does patient have financial barriers related to discharge medications?: No  Summary/Recommendations:  Rebecca [name] is  a 58 years old female admitted with Major Depression Disorder.  She will benefit from crisis stabilization, evaluation for medication, psycho-education groups for coping skills development, group therapy and case management for discharge planning.     Miro Balderson, Joesph July. 09/05/2013

## 2013-09-05 NOTE — [facility] Suicide Risk Assessment (Signed)
   Nursing information obtained from:  Patient Demographic factors:  Caucasian Current Mental Status:  Suicide plan Loss Factors:  Loss of significant relationship Historical Factors:  Prior suicide attempts Risk Reduction Factors:  Sense of responsibility to family Total Time spent with patient: 45 minutes  CLINICAL FACTORS:   Depression:   Anhedonia Impulsivity  Psychiatric Specialty Exam: Physical Exam  ROS  Blood pressure 132/73, pulse 81, temperature 98.8 F (37.1 C), temperature source Oral, resp. rate 18, height 5\' 4"  (1.626 m), weight 118.842 kg (262 lb).Body mass index is 44.95 kg/(m^2).  See admit note MSE   COGNITIVE FEATURES THAT CONTRIBUTE TO RISK:  Closed-mindedness    SUICIDE RISK:   Moderate:  Frequent suicidal ideation with limited intensity, and duration, some specificity in terms of plans, no associated intent, good self-control, limited dysphoria/symptomatology, some risk factors present, and identifiable protective factors, including available and accessible social support.  PLAN OF CARE: Patient will be admitted to inpatient psychiatric unit for stabilization and safety. Will provide and encourage milieu participation. Provide medication management and maked adjustments as needed.  Will follow daily.    I certify that inpatient services furnished can reasonably be expected to improve the patient's condition.  COBOS, FERNANDO 09/05/2013, 12:48 PM

## 2013-09-05 NOTE — Progress Notes (Signed)
Was called about RUE erythema at prior IV site. Went and assessed patient. Patient with slight erythema in the antecubital area of RUE. No warmth, non tender to palpation, no drainage. A/P 1. RUE IV site infiltration Recommend elevation of RUE, warm/cold compressors, NSAIDS as needed. No need for antibiotics at this time. Discussed with patient, nurse, and Dr Adela Glimpseabos.  Will sign off for now. Call with questions.

## 2013-09-05 NOTE — [facility] Group Notes (Signed)
[facility] Note   09/05/2013 10:20 AM    Participation Quality:  Appropraite  Mood/Affect:  Appropriate  Depression Rating:  8  Anxiety Rating:  3  Thoughts of Suicide:  No  Will you contract for safety?   NA  Current AVH:  No  Plan for Discharge/Comments:  Patient attended discharge planning group and actively participated in group.  She reports she will return to her home.  [facility]  CSW advised she would cancel appointment and reschedule as needed.  Patient advised she would prefer [place]. CSW provided all participants with daily workbook.   Transportation Means: Patient has transportation.   Supports:  Patient has a support system.   Terril Amaro, Joesph JulyQuylle Hairston

## 2013-09-06 ENCOUNTER — Encounter (HOSPITAL_COMMUNITY): Payer: Self-pay | Admitting: Internal Medicine

## 2013-09-06 ENCOUNTER — Ambulatory Visit (HOSPITAL_COMMUNITY): Payer: 59 | Admitting: Psychiatry

## 2013-09-06 DIAGNOSIS — R609 Edema, unspecified: Secondary | ICD-10-CM

## 2013-09-06 DIAGNOSIS — R600 Localized edema: Secondary | ICD-10-CM

## 2013-09-06 DIAGNOSIS — J45909 Unspecified asthma, uncomplicated: Secondary | ICD-10-CM

## 2013-09-06 DIAGNOSIS — R0601 Orthopnea: Secondary | ICD-10-CM

## 2013-09-06 LAB — TSH: TSH: 2.31 u[IU]/mL (ref 0.350–4.500)

## 2013-09-06 MED ORDER — FUROSEMIDE 20 MG PO TABS
20.0000 mg | ORAL_TABLET | Freq: Two times a day (BID) | ORAL | Status: AC
  Administered 2013-09-06 – 2013-09-07 (×2): 20 mg via ORAL
  Filled 2013-09-06 (×3): qty 1

## 2013-09-06 MED ORDER — ALBUTEROL SULFATE HFA 108 (90 BASE) MCG/ACT IN AERS: 2.0000 | INHALATION_SPRAY | RESPIRATORY_TRACT | Status: DC | PRN

## 2013-09-06 MED ORDER — PAROXETINE HCL 20 MG PO TABS
40.0000 mg | ORAL_TABLET | Freq: Every day | ORAL | Status: DC
  Administered 2013-09-07 – 2013-09-08 (×2): 40 mg via ORAL
  Filled 2013-09-06 (×3): qty 2
  Filled 2013-09-06: qty 8

## 2013-09-06 NOTE — Progress Notes (Signed)
Adult Psychoeducational Group Note  Date:  09/06/2013 Time:  11:26 PM  Group Topic/Focus:  Wrap-Up Group:   The focus of this group is to help patients review their daily goal of treatment and discuss progress on daily workbooks.  Participation Level:  Active  Participation Quality:  Appropriate  Affect:  Appropriate  Cognitive:  Appropriate  Insight: Appropriate  Engagement in Group:  Engaged  Modes of Intervention:  Discussion  Additional Comments:  Pt met goals by being out of her bed and writing in her journal to help her deal with outside stressors  Mady Gemma Latrice 09/06/2013, 11:26 PM

## 2013-09-06 NOTE — Progress Notes (Signed)
[facility]  09/06/2013 4:43 PM Rebecca [name]  MRN:  [id number] Subjective: Patient describes lower extremity edema, bilaterally Objective: At this time patient is improving partially. Feels less depressed, denies any suicidal thoughts. States that she has been sleeping poorly in the hospital, and has had nightmares. She has been going to groups and behavior on unit has been calm and in good control. No ETOH withdrawal symptoms- she does state she is pretty convinced that her suicidal attempt was related to her depression but fueled by recent drinking. As noted, does feel that medications she has been taking have been helpful and well tolerated. No side effects.  TSH WNL  Diagnosis: Major Depression, Recurrent severe, Consider Alcohol Abuse     Total Time spent with patient: 20 minutes    ADL's:  Improving   Sleep: Fair   Appetite: Appetite is "OK"  Suicidal Ideation:  Denies any currently Homicidal Ideation:  Denies any currently AEB (as evidenced by):  Psychiatric Specialty Exam: Physical Exam  Review of Systems  Constitutional: Negative for fever and chills.  Cardiovascular: Positive for orthopnea and leg swelling. Negative for chest pain.       Patient describes some degree of orthopnea noticed by her last night when she lay down in bed. She has also developed some bilateral leg edema. She has no SOB, or any history of cardiac illness or CHF. She feels she may have some edema from IV fluids which she was given while in the ER/hospital following her overdose Of note she is breathing comfortably at room air  Genitourinary: Negative for dysuria and urgency.  Skin: Negative for rash.  Psychiatric/Behavioral: Negative for suicidal ideas.    Blood pressure 135/74, pulse 78, temperature 98.5 F (36.9 C), temperature source Oral, resp. rate 16, height 5\' 4"  (1.626 m), weight 118.842 kg (262 lb).Body mass index is 44.95 kg/(m^2).  General Appearance: Well Groomed   Patent attorneyye Contact::  Good  Speech:  Normal Rate  Volume:  Normal  Mood:  improving  Affect:  Appropriate and still constricted  Thought Process:  Linear  Orientation:  NA- fully alert and attentive  Thought Content:  at this time denies any hallucinations and the music she had been hearing recently has stopped. No delusions  Suicidal Thoughts:  No- denies any suicidal or homicidal ideations, contracts for safety  Homicidal Thoughts:  No  Memory:  NA  Judgement:  Good  Insight:  Fair  Psychomotor Activity:  Normal  Concentration:  Good  Recall:  Good  Fund of Knowledge:Good  Language: Good  Akathisia:  Negative  Handed:  Right  AIMS (if indicated):     Assets:  Communication Skills Desire for Improvement Resilience  Sleep:  Number of Hours: 3   Musculoskeletal: Strength & Muscle Tone: within normal limits Gait & Station: normal Patient leans: N/A  Current Medications: Current Facility-Administered Medications  Medication Dose Route Frequency Provider Last Rate Last Dose  . acetaminophen (TYLENOL) tablet 650 mg  650 mg Oral Q6H PRN Beau FannyJohn C Withrow, FNP   650 mg at 09/06/13 1106  . alum & mag hydroxide-simeth (MAALOX/MYLANTA) 200-200-20 MG/5ML suspension 30 mL  30 mL Oral Q4H PRN Beau FannyJohn C Withrow, FNP      . ARIPiprazole (ABILIFY) tablet 5 mg  5 mg Oral Daily Beau FannyJohn C Withrow, FNP   5 mg at 09/06/13 0753  . buPROPion (WELLBUTRIN XL) 24 hr tablet 150 mg  150 mg Oral q morning - 10a Nehemiah MassedFernando Cobos, MD   150  mg at 09/06/13 1104  . gabapentin (NEURONTIN) capsule 300 mg  300 mg Oral BID Beau Fanny, FNP   300 mg at 09/06/13 0753  . hydrOXYzine (ATARAX/VISTARIL) tablet 25 mg  25 mg Oral Q6H PRN Beau Fanny, FNP   25 mg at 09/06/13 0759  . ibuprofen (ADVIL,MOTRIN) tablet 600 mg  600 mg Oral Q6H PRN Beau Fanny, FNP   600 mg at 09/06/13 1417  . magnesium hydroxide (MILK OF MAGNESIA) suspension 30 mL  30 mL Oral Daily PRN Beau Fanny, FNP      . mometasone-formoterol (DULERA) 100-5  MCG/ACT inhaler 2 puff  2 puff Inhalation BID Beau Fanny, FNP   2 puff at 09/06/13 0753  . pantoprazole (PROTONIX) EC tablet 40 mg  40 mg Oral Daily Beau Fanny, FNP   40 mg at 09/06/13 0754  . PARoxetine (PAXIL-CR) 24 hr tablet 50 mg  50 mg Oral Daily Beau Fanny, FNP   50 mg at 09/06/13 0754  . rOPINIRole (REQUIP) tablet 0.5 mg  0.5 mg Oral QHS Beau Fanny, FNP   0.5 mg at 09/05/13 2209  . traZODone (DESYREL) tablet 50 mg  50 mg Oral QHS PRN,MR X 1 Beau Fanny, FNP   50 mg at 09/06/13 9604    Lab Results:  Results for orders placed during the hospital encounter of 09/04/13 (from the past 48 hour(s))  TSH     Status: None   Collection Time    09/06/13  6:35 AM      Result Value Ref Range   TSH 2.310  0.350 - 4.500 uIU/mL   Comment: Performed at [facility]    Physical Findings: AIMS: Facial and Oral Movements Muscles of Facial Expression: None, normal Lips and Perioral Area: None, normal Jaw: None, normal Tongue: None, normal,Extremity Movements Upper (arms, wrists, hands, fingers): None, normal Lower (legs, knees, ankles, toes): None, normal, Trunk Movements Neck, shoulders, hips: None, normal, Overall Severity Severity of abnormal movements (highest score from questions above): None, normal Incapacitation due to abnormal movements: None, normal Patient's awareness of abnormal movements (rate only patient's report): No Awareness, Dental Status Current problems with teeth and/or dentures?: No Does patient usually wear dentures?: No  CIWA:  CIWA-Ar Total: 2 COWS:  COWS Total Score: 2  Assessment patient is gradually improving. At this time she is not suicidal or psychotic. She seems more future oriented. She is tolerating medications well. She did sleep poorly last night, and had nightmares, which may be related to medication ( trazodone)  or change of environment. Has some bilateral leg edema, which she states she did not have prior to admission. No SOB or  chest pain .     Treatment Plan Summary: Daily contact with patient to assess and evaluate symptoms and progress in treatment Medication management See below  Plan: Continue inpatient treatment, group therapy and support Obtain hospitalist consult to address edema and possible orthopnea.  Continue Wellbutrin XL  150 mgrs a day, Abilify 5 mgrs a day, D/C Paxil XR and start Paxil 40 mgrs a day, Neurontin 300 mgrs BID   Medical Decision Making Problem Points:  Established problem, stable/improving (1) Data Points:  Review or order clinical lab tests (1) Review of medication regiment & side effects (2) Review of new medications or change in dosage (2)  I certify that inpatient services furnished can reasonably be expected to improve the patient's condition.   COBOS, FERNANDO 09/06/2013, 4:43 PM

## 2013-09-06 NOTE — Progress Notes (Signed)
Recreation Therapy Notes  Animal-Assisted Activity/Therapy (AAA/T) Program Checklist/Progress Notes Patient Eligibility Criteria Checklist & Daily Group note for Rec Tx Intervention  Date: 08.04.2015 Time: 3:15pm [address]   AAA/T Program Assumption of Risk Form signed by Patient/ or Parent Legal Guardian yes  Patient is free of allergies or sever asthma yes  Patient reports no fear of animals yes  Patient reports no history of cruelty to animals yes   Patient understands his/her participation is voluntary yes  Patient washes hands before animal contact yes  Patient washes hands after animal contact yes  Behavioral Response: Engaged, Appropriate   Education: Hand Washing, Appropriate Animal Interaction   Education Outcome: Acknowledges understanding  Clinical Observations/Feedback: Patient interacted appropriately with therapy dog team and peers in session. Patient asked appropriate questions about therapy dog and his training, as well as shared stories about pets she has had in the past.   Jearl Klinefelterenise L Tamberlyn Midgley, LRT/CTRS        Jearl KlinefelterBlanchfield, Orissa Arreaga L 09/06/2013 4:15 PM

## 2013-09-06 NOTE — Progress Notes (Signed)
The focus of this group is to educate the patient on the purpose and policies of crisis stabilization and provide a format to answer questions about their admission.  The group details unit policies and expectations of patients while admitted.  Patient attended 0900 nurse education orientation group this morning.  Patient actively participated, appropriate affect, alert, appropriate insight and engagement.  Today patient will work on 3 goals for discharge.  

## 2013-09-06 NOTE — Progress Notes (Signed)
D:  Patient's self inventory sheet, patient has poor sleep, took sleep medication which she does not feel is helpful.   Fair appetite, low energy level, good concentration.  Rated depression, hopeless and anxiety #5.  Has experienced withdrawals of insomnia.  Denied SI.  Physical problems of low back ache and bilateral leg pain.  Takes pain medication which is helpful.  Plans to continue attending group therapy.  Plans to stay out of her room.  Does have discharge plans.  No problems taking medications after discharge. A:  Medications administered per MD orders.  Emotional support and encouragement given patient. R:  Denied SI and HI, contracts for safety.   Denied A/V hallucinations.   Safety maintained with 15 minute checks.

## 2013-09-06 NOTE — Progress Notes (Signed)
NUTRITION ASSESSMENT  Pt identified as at risk on the Malnutrition Screen Tool  INTERVENTION: 1. Educated patient on the importance of nutrition and encouraged intake of food and beverages. 2. Discussed strategies to help reduce binge eating behavior 3. Encouraged outpatient RD follow up at [facility], provided pt with their contact information   NUTRITION DIAGNOSIS: Unintentional weight gain related to binge eating as evidenced by pt report.   Goal: Pt to meet >/= 90% of their estimated nutrition needs.  Monitor:  PO intake  Assessment:  Admitted with depression, had recent Ambien and Oxycodone overdose, and wrote a suicide note. Pt reports she has had 6 people she knew that died since 11/27/2022 of last year.  - Met with pt who reports she's had 30 pound unintended weight gain in the past few months due to binge eating, especially at night - Said she's also been drinking alcohol, didn't specify how much - States her sleep has been poor, only 2-3 hours at night    58 y.o. female  Height: Ht Readings from Last 1 Encounters:  09/04/13 '5\' 4"'  (1.626 m)    Weight: Wt Readings from Last 1 Encounters:  09/04/13 262 lb (118.842 kg)    Weight Hx: Wt Readings from Last 10 Encounters:  09/04/13 262 lb (118.842 kg)  09/03/13 281 lb 4.9 oz (127.6 kg)    BMI:  Body mass index is 44.95 kg/(m^2). Pt meets criteria for class III extreme obesity based on current BMI.  Estimated Nutritional Needs: Kcal: 25-30 kcal/kg of ideal weight Protein: > 1 gram protein/kg Fluid: 1 ml/kcal  Diet Order: General Pt is also offered choice of unit snacks mid-morning and mid-afternoon.  Pt is eating as desired.   Lab results and medications reviewed.   Rebecca Stable MS, [place], LDN [phone] Pager [phone] Weekend/After Hours Pager

## 2013-09-06 NOTE — Progress Notes (Signed)
D: Patient denies SI/HI/AVH. Patient rates hopelessness as 1,  depression as 3, and anxiety as 2.  Patient affect is anxious. Mood is anxioius.  Pt states, "I've been happy today.  My husband and my daughter came to visit me.  They are my two favorite people."  Patient did attend evening group. Patient visible on the milieu. No distress noted. A: Support and encouragement offered. Scheduled medications given to pt. Q 15 min checks continued for patient safety. R: Patient receptive. Patient remains safe on the unit.

## 2013-09-06 NOTE — Tx Team (Signed)
Interdisciplinary Treatment Plan Update   Date Reviewed:  09/06/2013  Time Reviewed:  9:52 AM  Progress in Treatment:   Attending groups: Yes Participating in groups: Yes Taking medication as prescribed: Yes  Tolerating medication: Yes Family/Significant other contact made:  No, but will ask patient for consent for collateral contact Patient understands diagnosis: Yes  Discussing patient identified problems/goals with staff: Yes Medical problems stabilized or resolved: Yes Denies suicidal/homicidal ideation: Yes Patient has not harmed self or others: Yes  For review of initial/current patient goals, please see plan of care.  Estimated Length of Stay:  2-4 days  Reasons for Continued Hospitalization:  Anxiety Depression Medication stabilization   New Problems/Goals identified:    Discharge Plan or Barriers:   Home [facility] [place]  Additional Comments:  Attendees:  Patient:  09/06/2013 9:52 AM   Signature:  Sallyanne HaversF. Cobos, MD 09/06/2013 9:52 AM  Signature:  09/06/2013 9:52 AM  Signature:  09/06/2013 9:52 AM  Signature:Beverly Terrilee CroakKnight, RN 09/06/2013 9:52 AM  Signature:  Neill Loftarol Davis RN 09/06/2013 9:52 AM  Signature:  Juline PatchQuylle Derik Fults, LCSW 09/06/2013 9:52 AM  Signature:   09/06/2013 9:52 AM  Signature:  Leisa LenzValerie Enoch, Care Coordinator [facility] 09/06/2013 9:52 AM  Signature:   09/06/2013 9:52 AM  Signature 09/06/2013  9:52 AM  Signature:   Onnie BoerJennifer Clark, RN URCM 09/06/2013  9:52 AM  Signature: 09/06/2013  9:52 AM    Scribe for Treatment Team:   Juline PatchQuylle Channah Godeaux,  09/06/2013 9:52 AM

## 2013-09-06 NOTE — [facility] Group Notes (Signed)
[facility] LCSW Group Therapy      Feelings About Diagnosis 1:15 - 2:30 PM         09/06/2013    Type of Therapy:  Group Therapy  Participation Level:  Active  Participation Quality:  Appropriate  Affect:  Appropriate  Cognitive:  Alert and Appropriate  Insight:  Developing/Improving and Engaged  Engagement in Therapy:  Developing/Improving and Engaged  Modes of Intervention:  Discussion, Education, Exploration, Problem-Solving, Rapport Building, Support  Summary of Progress/Problems:  Patient actively participated in group. Patient discussed past and present diagnosis and the effects it has had on  life.  Patient talked about family and society being judgmental and the stigma associated with having a mental health diagnosis.  Patient shared she is angered by the fact that she is depressed and other around her does not see her depression.  She was unable to understand how depression can sometimes be masked and hidden from co-workers and family.  Wynn BankerHodnett, [place] Dani Hairston 09/06/2013

## 2013-09-06 NOTE — Consult Note (Signed)
[facility] Medical Consultation  Rebecca [name] ZOX:[id number] DOB: October 22, 1955 DOA: 09/04/2013 PCP: Leo Grosser, MD   Requesting physician: Brigid Re Date of consultation: 09/06/2013 Reason for consultation: lower extremity swelling, orthopnea  Impression/Recommendations Active Problems:   Asthma, chronic   Morbid obesity   MDD (major depressive disorder)   Lower extremity edema  Major depressive disorder with suicide attempt by drug overdose -  Management per psychiatry -  Currently on Abilify, Wellbutrin, gabapentin, Paxil  Lower extremity edema & orthopnea suggestive of possible underlying heart failure, probably diastolic heart failure plus the hydration she received in the ICU the last few days.  ECG from 09/02/13 with sinus tach and without Q-waves suggestive of old ischemia.  Risk factors for CAD include obesity and former smoker.  No renal failure or evidence of hepatitis/cirrhosis on admission labs or by history.  Albumin mildly low at 3.1, but not low enough to explain edema.  Patient received DVT prophylaxis during her stay, no cords on exam or erythema to suggest DVT and edema is symmetric. -  UA to screen for proteinuria -  ECHO -  CXR -  Ted hose, elevate legs -  Daily weights -  Lasix 20mg  tonight, repeat in AM  Asthma, stable, continue Dulera -  Add prn albuterol  Obesity, recommend weight loss.   -  Consider outpatient sleep study which can be ordered by PCP  I will followup again tomorrow. Please contact me if I can be of assistance in the meanwhile. Thank you for this consultation.  Chief Complaint:  Drug overdose, suicide attempt  HPI:   The patient is a 58 year old female with past medical history of asthma, depression with previous history of suicide attempt approximately 2 years ago who presented with intentional drug overdose on 7/31 to the emergency department. She left a suicide note stating that she took a lot of hydrocodone,  tramadol, and Ambien.  She was given Narcan in the emergency department. A call level was 100 and her urine drug screen was positive for opiates.  She was admitted to the intensive care unit for monitoring. She was discharged on 8/2 and admitted to behavioral health on 8/3 when a bed became available.  She is now complaining of cough while lying flat, PND, and lower extremity swelling for the last few days.  Also has some mild DOE.  Denies history of heart failure, MI, hepatitis, cirrhosis, DVT, PE.  Had decreased uop a few days ago, but has since normalized.  Asthma has been well controlled.  Denies calf tenderness, erythema.  Legs are sore from swelling.    Review of Systems:  12 point ROS negative except as listed above.  Past Medical History  Diagnosis Date  . Asthma   . Depression   . Reflux    Past Surgical History  Procedure Laterality Date  . Knee surgery      left knee   Social History:  reports that she has quit smoking. Her smoking use included Cigarettes. She smoked 0.00 packs per day. She does not have any smokeless tobacco history on file. She reports that she drinks about 1.2 ounces of alcohol per week. She reports that she does not use illicit drugs.  Allergies  Allergen Reactions  . Sulfa Antibiotics Other (See Comments)    unknown   Family History  Problem Relation Age of Onset  . Deep vein thrombosis Neg Hx   . Pulmonary embolism Neg Hx   . Heart disease Neg Hx   .  Heart failure Neg Hx     Prior to Admission medications   Medication Sig Start Date End Date Taking? Authorizing Provider  ALPRAZolam Prudy Feeler) 0.5 MG tablet Take 0.25-0.5 mg by mouth 2 (two) times daily as needed for anxiety.   Yes Historical Provider, MD  ARIPiprazole (ABILIFY) 5 MG tablet Take 5 mg by mouth daily.   Yes Historical Provider, MD  fluticasone-salmeterol (ADVAIR HFA) 115-21 MCG/ACT inhaler Inhale 2 puffs into the lungs 2 (two) times daily.    Yes Historical Provider, MD  gabapentin  (NEURONTIN) 300 MG capsule Take 300 mg by mouth 2 (two) times daily.    Yes Historical Provider, MD  ibuprofen (ADVIL,MOTRIN) 200 MG tablet Take 600 mg by mouth every 6 (six) hours as needed for mild pain.   Yes Historical Provider, MD  pantoprazole (PROTONIX) 40 MG tablet Take 40 mg by mouth daily.   Yes Historical Provider, MD  PARoxetine (PAXIL-CR) 25 MG 24 hr tablet Take 50 mg by mouth daily.   Yes Historical Provider, MD  rOPINIRole (REQUIP) 0.5 MG tablet Take 0.5 mg by mouth at bedtime.    Yes Historical Provider, MD  zolpidem (AMBIEN) 10 MG tablet Take 10 mg by mouth at bedtime.   Yes Historical Provider, MD  HYDROcodone-acetaminophen (NORCO) 7.5-325 MG per tablet Take 1-2 tablets by mouth every 4 (four) hours as needed for moderate pain.    Historical Provider, MD  traMADol (ULTRAM) 50 MG tablet Take 50 mg by mouth 3 (three) times daily as needed.     Historical Provider, MD   Physical Exam: Blood pressure 135/74, pulse 78, temperature 98.5 F (36.9 C), temperature source Oral, resp. rate 16, height 5\' 4"  (1.626 m), weight 118.842 kg (262 lb). Filed Vitals:   09/05/13 1110 09/06/13 0630 09/06/13 0631 09/06/13 1011  BP: 132/73 134/78 135/74 135/74  Pulse: 81 73 78 78  Temp:  98.5 F (36.9 C)    TempSrc:  Oral    Resp:  16    Height:      Weight:         General:  Obese WF, NAD  Eyes:  PERRL, anicteric, non-injected.  ENT:  Nares clear.  OP clear, non-erythematous without plaques or exudates.  MMM.  Neck:  Supple without TM or JVD.    Lymph:  No cervical, supraclavicular, or submandibular LAD.  Cardiovascular:  RRR, normal S1, S2, 1/6 systolic murmur at LSB.  2+ pulses, warm extremities  Respiratory:  CTA bilaterally without increased WOB.  Abdomen:  NABS.  Soft, ND/NT.    Skin:  No rashes or focal lesions.  Musculoskeletal:  Normal bulk and tone.  Trace bilateral  LE edema of ankles.  Psychiatric:  A & O x 4.  Appropriate affect.  Neurologic:  CN 3-12 intact.   5/5 strength.  Sensation intact.  tremulous  Labs on Admission:  Basic Metabolic Panel:  Recent Labs Lab 09/02/13 0841 09/03/13 0516  NA 139 139  K 5.2 4.7  CL 102 107  CO2 27 22  GLUCOSE 115* 91  BUN 18 20  CREATININE 0.88 0.84  CALCIUM 9.1 7.9*   Liver Function Tests:  Recent Labs Lab 09/02/13 0841 09/03/13 0516  AST 22 36  ALT 24 24  ALKPHOS 119* 107  BILITOT 0.2* 0.3  PROT 7.2 6.6  ALBUMIN 3.5 3.1*   No results found for this basename: LIPASE, AMYLASE,  in the last 168 hours No results found for this basename: AMMONIA,  in the last 168 hours  CBC:  Recent Labs Lab 09/02/13 0841 09/03/13 0516  WBC 8.8 9.1  NEUTROABS 5.3  --   HGB 13.0 11.3*  HCT 40.4 36.9  MCV 89.2 93.4  PLT 384 365   Cardiac Enzymes: No results found for this basename: CKTOTAL, CKMB, CKMBINDEX, TROPONINI,  in the last 168 hours BNP: No components found with this basename: POCBNP,  CBG:  Recent Labs Lab 09/04/13 0029 09/04/13 0040 09/04/13 0113 09/04/13 0804 09/04/13 1156  GLUCAP 56* 75 122* 86 115*    Radiological Exams on Admission: No results found.  EKG: Independently reviewed. Sinus tachycardia  Time spent: 75 min  Daegan Arizmendi [facility] Pager [phone]  If 7PM-7AM, please contact night-coverage [email or web address] Password [email or web address] 09/06/2013, 6:58 PM

## 2013-09-07 ENCOUNTER — Ambulatory Visit (HOSPITAL_COMMUNITY)
Admit: 2013-09-07 | Discharge: 2013-09-07 | Disposition: A | Discharge status: Home / Self Care | Payer: 59 | Attending: Internal Medicine | Admitting: Internal Medicine

## 2013-09-07 DIAGNOSIS — R609 Edema, unspecified: Secondary | ICD-10-CM | POA: Insufficient documentation

## 2013-09-07 DIAGNOSIS — R059 Cough, unspecified: Secondary | ICD-10-CM | POA: Insufficient documentation

## 2013-09-07 DIAGNOSIS — R05 Cough: Secondary | ICD-10-CM | POA: Insufficient documentation

## 2013-09-07 DIAGNOSIS — I517 Cardiomegaly: Secondary | ICD-10-CM

## 2013-09-07 LAB — URINALYSIS, ROUTINE W REFLEX MICROSCOPIC
Bilirubin Urine: NEGATIVE
Glucose, UA: NEGATIVE mg/dL
HGB URINE DIPSTICK: NEGATIVE
Ketones, ur: NEGATIVE mg/dL
Leukocytes, UA: NEGATIVE
Nitrite: NEGATIVE
PH: 7 (ref 5.0–8.0)
Protein, ur: NEGATIVE mg/dL
SPECIFIC GRAVITY, URINE: 1.013 (ref 1.005–1.030)
UROBILINOGEN UA: 0.2 mg/dL (ref 0.0–1.0)

## 2013-09-07 NOTE — Progress Notes (Signed)
Patient ID: Rebecca [name], female   DOB: [birth year], 58 y.o.   MRN: [id number] [facility] [name] Progress Note  09/07/2013 4:01 PM Rebecca [name]  MRN:  [id number] Subjective: Patient is feeling better. Objective: At this time patient is presenting with improved mood and affect , and is describing significant improvement compared to admission. She reports medications are helping her, and currently denies side effects.  I appreciate Hospitalist consultation and work up , regarding report of lower extremity edema and orthopnea. At this time Echocardiogram/ Chest X Ray  results are unremarkable except for some mild LVH, but EF is WNL and there is no evidence of pulmonary edema or effusion or CHF.  Regarding this , patient states her lower extremity edema ( bilateral) is now subsiding and today has been able to lie down flat in her bed with no SOB. As she improves she is looking forward to discharge soon. No disruptive behaviors on unit- going to groups regularly. Sleep improved . Denies medication side effects at the present time.   Diagnosis: Major Depression, Recurrent severe, Consider Alcohol Abuse     Total Time spent with patient: 20 minutes    ADL's:  Improving   Sleep: improved  Appetite: Appetite is "OK"  Suicidal Ideation:  Denies any currently Homicidal Ideation:  Denies any currently AEB (as evidenced by):  Psychiatric Specialty Exam: Physical Exam  Review of Systems  Constitutional: Negative for fever and chills.  Respiratory: Negative for cough and shortness of breath.   Cardiovascular: Positive for leg swelling. Negative for chest pain and orthopnea.       Patient describes some degree of orthopnea noticed by her last night when she lay down in bed. She has also developed some bilateral leg edema. She has no SOB, or any history of cardiac illness or CHF. She feels she may have some edema from IV fluids which she was given while in the ER/hospital following her overdose Of  note she is breathing comfortably at room air  Genitourinary: Negative for dysuria and urgency.  Skin: Negative for rash.  Psychiatric/Behavioral: Negative for suicidal ideas.    Blood pressure 144/93, pulse 82, temperature 98.4 F (36.9 C), temperature source Oral, resp. rate 20, height 5\' 4"  (1.626 m), weight 118.842 kg (262 lb).Body mass index is 44.95 kg/(m^2).  General Appearance: Well Groomed  Patent attorney::  Good  Speech:  Normal Rate  Volume:  Normal  Mood:  improving  Affect:  Appropriate and at this time smiles often and appropriately  Thought Process:  Linear  Orientation:  NA- fully alert and attentive  Thought Content:  denies any hallucinations and no delusions are noted   Suicidal Thoughts:  No- denies any suicidal or homicidal ideations, contracts for safety  Homicidal Thoughts:  No  Memory:  NA  Judgement:  Good  Insight:  Fair  Psychomotor Activity:  Normal  Concentration:  Good  Recall:  Good  Fund of Knowledge:Good  Language: Good  Akathisia:  Negative  Handed:  Right  AIMS (if indicated):     Assets:  Communication Skills Desire for Improvement Resilience  Sleep:  Number of Hours: 3   Musculoskeletal: Strength & Muscle Tone: within normal limits Gait & Station: normal Patient leans: N/A  Current Medications: Current Facility-Administered Medications  Medication Dose Route Frequency Provider Last Rate Last Dose  . acetaminophen (TYLENOL) tablet 650 mg  650 mg Oral Q6H PRN Rebecca Fanny, FNP   650 mg at 09/07/13 1039  . albuterol (PROVENTIL HFA;VENTOLIN  HFA) 108 (90 BASE) MCG/ACT inhaler 2 puff  2 puff Inhalation Q4H PRN Rebecca FickleMackenzie Short, [name]      . alum & mag hydroxide-simeth (MAALOX/MYLANTA) 200-200-20 MG/5ML suspension 30 mL  30 mL Oral Q4H PRN Rebecca FannyJohn C Withrow, FNP      . ARIPiprazole (ABILIFY) tablet 5 mg  5 mg Oral Daily Rebecca FannyJohn C Withrow, FNP   5 mg at 09/07/13 [id number]  . buPROPion (WELLBUTRIN XL) 24 hr tablet 150 mg  150 mg Oral q morning - 10a Rebecca [name]   150 mg at 09/07/13 1037  . gabapentin (NEURONTIN) capsule 300 mg  300 mg Oral BID Rebecca FannyJohn C Withrow, FNP   300 mg at 09/07/13 [id number]  . hydrOXYzine (ATARAX/VISTARIL) tablet 25 mg  25 mg Oral Q6H PRN Rebecca FannyJohn C Withrow, FNP   25 mg at 09/07/13 0038  . ibuprofen (ADVIL,MOTRIN) tablet 600 mg  600 mg Oral Q6H PRN Rebecca FannyJohn C Withrow, FNP   600 mg at 09/07/13 0630  . magnesium hydroxide (MILK OF MAGNESIA) suspension 30 mL  30 mL Oral Daily PRN Rebecca FannyJohn C Withrow, FNP      . mometasone-formoterol [facility](DULERA) 100-5 MCG/ACT inhaler 2 puff  2 puff Inhalation BID Rebecca FannyJohn C Withrow, FNP   2 puff at 09/07/13 [phone]  . pantoprazole (PROTONIX) EC tablet 40 mg  40 mg Oral Daily Rebecca FannyJohn C Withrow, FNP   40 mg at 09/07/13 [id number]  . PARoxetine (PAXIL) tablet 40 mg  40 mg Oral Daily Rebecca MassedFernando Kasra Melvin, [name]   40 mg at 09/07/13 [id number]  . rOPINIRole (REQUIP) tablet 0.5 mg  0.5 mg Oral QHS Rebecca FannyJohn C Withrow, FNP   0.5 mg at 09/06/13 2140  . traZODone (DESYREL) tablet 50 mg  50 mg Oral QHS PRN,MR X 1 Rebecca FannyJohn C Withrow, FNP   50 mg at 09/07/13 [id number]    Lab Results:  Results for orders placed during the hospital encounter of 09/04/13 (from the past 48 hour(s))  TSH     Status: None   Collection Time    09/06/13  6:35 AM      Result Value Ref Range   TSH 2.310  0.350 - 4.500 uIU/mL   Comment: [facility]  URINALYSIS, ROUTINE W REFLEX MICROSCOPIC     Status: None   Collection Time    09/07/13 12:00 AM      Result Value Ref Range   Color, Urine YELLOW  YELLOW   APPearance CLEAR  CLEAR   Specific Gravity, Urine 1.013  1.005 - 1.030   pH 7.0  5.0 - 8.0   Glucose, UA NEGATIVE  NEGATIVE mg/dL   Hgb urine dipstick NEGATIVE  NEGATIVE   Bilirubin Urine NEGATIVE  NEGATIVE   Ketones, ur NEGATIVE  NEGATIVE mg/dL   Protein, ur NEGATIVE  NEGATIVE mg/dL   Urobilinogen, UA 0.2  0.0 - 1.0 mg/dL   Nitrite NEGATIVE  NEGATIVE   Leukocytes, UA NEGATIVE  NEGATIVE   Comment: MICROSCOPIC NOT DONE ON URINES WITH NEGATIVE PROTEIN, BLOOD,  LEUKOCYTES, NITRITE, OR GLUCOSE <1000 mg/dL.     [facility]    Physical Findings: AIMS: Facial and Oral Movements Muscles of Facial Expression: None, normal Lips and Perioral Area: None, normal Jaw: None, normal Tongue: None, normal,Extremity Movements Upper (arms, wrists, hands, fingers): None, normal Lower (legs, knees, ankles, toes): None, normal, Trunk Movements Neck, shoulders, hips: None, normal, Overall Severity Severity of abnormal movements (highest score from questions above): None, normal Incapacitation due to abnormal  movements: None, normal Patient's awareness of abnormal movements (rate only patient's report): No Awareness, Dental Status Current problems with teeth and/or dentures?: No Does patient usually wear dentures?: No  CIWA:  CIWA-Ar Total: 2 COWS:  COWS Total Score: 2  Assessment Continues to improve, with better mood and more reactive affect , and resolution of vague hallucinations she was describing. CHF work up by hospitalist negative thus far, and symptoms subsiding. Tolerating medications well. States she is gaining insight regarding the negative impact that alcohol consumption likely played in her decompensation and describes motivation to maintain sobriety.     Treatment Plan Summary: Daily contact with patient to assess and evaluate symptoms and progress in treatment Medication management See below  Plan: Continue inpatient treatment, group therapy and support. Consider discharge soon as she continues to improve. Continue Wellbutrin XL  150 mgrs a day, Abilify 5 mgrs a day,  Paxil 40 mgrs a day, Neurontin 300 mgrs BID   Medical Decision Making Problem Points:  Established problem, stable/improving (1) Data Points:  Review or order clinical lab tests (1) Review of medication regiment & side effects (2)  I certify that inpatient services furnished can reasonably be expected to improve the patient's condition.    Kristyanna Barcelo 09/07/2013, 4:01 PM

## 2013-09-07 NOTE — Progress Notes (Signed)
Follow up with pt.  Support around discharge, pt requested prayers.  Pt is RN in system and chaplain will continue to be available for support.   Belva CromeStalnaker, Linell Meldrum Wayne MDiv

## 2013-09-07 NOTE — Progress Notes (Signed)
D: Patient presents with appropriate affect and depressed mood. She reported on the self inventory sheet that sleep, appetite, and ability to concentrate are all good and energy level is low. Patient rated depression/feelings of hopelessness "2" and anxiety "1". She's actively participating in groups and visible in the milieu. Patient compliant with medication regimen.  A: Support and encouragement provided to patient. Scheduled medications administered per MD orders. Maintain Q15 minute checks for safety.  R: Patient receptive. Denies SI/HI/AVH. Patient remains safe.

## 2013-09-07 NOTE — Progress Notes (Signed)
Patient ID: Rebecca [name], female   DOB: [birth year], 58 y.o.   MRN: [id number]  [facility] PROGRESS NOTE  Rebecca [name] [id number] DOB: [birth year] DOA: 09/04/2013 PCP: Cain SaupeFULP, CAMMIE, MD  Brief narrative: 58 year old female with past medical history of asthma, depression with previous history of suicide attempt approximately 2 years ago, who presented with intentional drug overdose on 7/31 to the emergency department. She left a suicide note stating that she took a lot of hydrocodone, tramadol, and Ambien. She was given Narcan in the emergency department. She was admitted to the intensive care unit for monitoring. She was discharged on 8/2 and admitted to behavioral health on 8/3. She started to note worsening cough with laying down flat, PND, and lower extremity swelling for the last few days. Also has some mild DOE. Denies history of heart failure, MI, hepatitis, cirrhosis, DVT, PE.  Active Problems:   Asthma, chronic - appears to be clinically stable this AM - denies shortness of breath   MDD (major depressive disorder) - management per primary team    Lower extremity edema with orthopnea - improving - no signs of diastolic or systolic heart failure based on 2 D ECHO August 5th, 2015 - weight remains stable at 282 lbs - consider adding Lasix as needed  Consultants:  [facility] consulting  Procedures/Studies:  CXR 09/07/2013  No radiographic evidence of acute cardiopulmonary disease Antibiotics:  None  Code Status: Full Family Communication: Pt at bedside [facility]  HPI/Subjective: No events overnight.   Objective: Filed Vitals:   09/06/13 1011 09/07/13 0500 09/07/13 0619 09/07/13 0621  BP: 135/74  146/79 144/93  Pulse: 78  72 82  Temp:   98.4 F (36.9 C)   TempSrc:      Resp:   20   Height:      Weight:  118.842 kg (262 lb)     No intake or output data in the 24 hours ending 09/07/13 1429  Exam:   General:  Pt is alert, follows  commands appropriately, not in acute distress  Cardiovascular: Regular rate and rhythm, S1/S2, no murmurs, no rubs, no gallops  Respiratory: Clear to auscultation bilaterally, no wheezing, diminished breath sounds at bases   Abdomen: Soft, non tender, non distended, bowel sounds present, no guarding  Extremities: +1 LE pitting, pulses DP and PT palpable bilaterally  Neuro: Grossly nonfocal  Data Reviewed: Basic Metabolic Panel:  Recent Labs Lab 09/02/13 0841 09/03/13 0516  NA 139 139  K 5.2 4.7  CL 102 107  CO2 27 22  GLUCOSE 115* 91  BUN 18 20  CREATININE 0.88 0.84  CALCIUM 9.1 7.9*   Liver Function Tests:  Recent Labs Lab 09/02/13 0841 09/03/13 0516  AST 22 36  ALT 24 24  ALKPHOS 119* 107  BILITOT 0.2* 0.3  PROT 7.2 6.6  ALBUMIN 3.5 3.1*   CBC:  Recent Labs Lab 09/02/13 0841 09/03/13 0516  WBC 8.8 9.1  NEUTROABS 5.3  --   HGB 13.0 11.3*  HCT 40.4 36.9  MCV 89.2 93.4  PLT 384 365   CBG:  Recent Labs Lab 09/04/13 0029 09/04/13 0040 09/04/13 0113 09/04/13 0804 09/04/13 1156  GLUCAP 56* 75 122* 86 115*    Recent Results (from the past 240 hour(s))  MRSA PCR SCREENING     Status: None   Collection Time    09/02/13 10:55 AM      Result Value Ref Range Status   MRSA by PCR NEGATIVE  NEGATIVE Final  Comment:            The GeneXpert MRSA Assay (FDA     approved for NASAL specimens     only), is one component of a     comprehensive MRSA colonization     surveillance program. It is not     intended to diagnose MRSA     infection nor to guide or     monitor treatment for     MRSA infections.     Scheduled Meds: . ARIPiprazole  5 mg Oral Daily  . buPROPion  150 mg Oral q morning - 10a  . gabapentin  300 mg Oral BID  . mometasone-formoterol  2 puff Inhalation BID  . pantoprazole  40 mg Oral Daily  . PARoxetine  40 mg Oral Daily  . rOPINIRole  0.5 mg Oral QHS   Continuous Infusions:    Debbora Presto, MD  [facility] Pager  [phone]  If 7PM-7AM, please contact night-coverage [email or web address] Password [email or web address] 09/07/2013, 2:29 PM   LOS: 3 days

## 2013-09-07 NOTE — [facility] Group Notes (Signed)
[facility] LCSW Group Therapy  Emotional Regulation 1:15 - 2: 30 PM        09/07/2013     Type of Therapy:  Group Therapy  Participation Level:  Patient left group to meet with MD.   Wynn BankerHodnett, [name] 09/07/2013

## 2013-09-07 NOTE — Progress Notes (Signed)
D: Patient in the hallway on approach.  Patient states she had a great day.  Patient states she is looking forward to being discharged tomorrow.  Patient states she has talked with her family and she states everyone is on the same page.  Patient denies SI/HI and denies AVH. A: Staff to monitor Q 15 mins for safety.  Encouragement and support offered.  Scheduled medications administered per orders.  Trazodone administered prn vistaril administered prn  R: Patient remains safe on the unit.  Patient attended group tonight.  Patient visible on the unit and interacting with peers.  Patient taking adminstered medications.

## 2013-09-07 NOTE — Progress Notes (Signed)
Echocardiogram 2D Echocardiogram has been performed.  Roshard Rezabek 09/07/2013, 11:34 AM

## 2013-09-07 NOTE — Progress Notes (Signed)
Adult Psychoeducational Group Note  Date:  09/07/2013 Time:  10:00am  Group Topic/Focus:  Personal Choices and Values:   The focus of this group is to help patients assess and explore the importance of values in their lives, how their values affect their decisions, how they express their values and what opposes their expression.  Participation Level:  Active  Participation Quality:  Appropriate and Attentive  Affect:  Appropriate  Cognitive:  Alert and Appropriate  Insight: Appropriate  Engagement in Group:  Engaged  Modes of Intervention:  Discussion and Education  Additional Comments:  Pt attended and participated in group. Discussion was on personal development and the question was asked What does personal development mean to you? Pt stated it means setting goals making healthy choices.   Shelly BombardGarner, Daemian Gahm D 09/07/2013, 6:50 PM

## 2013-09-07 NOTE — Progress Notes (Signed)
D: Pt denies SI/HI/AVH. Pt is pleasant and cooperative. Pt stated she just wanted to get the drinking under control. Pt stated she was in "a good place now".   A: Pt was offered support and encouragement. Pt was given scheduled medications. Pt was encourage to attend groups. Q 15 minute checks were done for safety.   R:Pt attends groups and interacts well with peers and staff. Pt is taking medication. Pt has no complaints at this time .Pt receptive to treatment and safety maintained on unit.

## 2013-09-07 NOTE — Progress Notes (Signed)
Adult Psychoeducational Group Note  Date:  09/07/2013 Time:  11:04 PM  Group Topic/Focus:  Wrap-Up Group:   The focus of this group is to help patients review their daily goal of treatment and discuss progress on daily workbooks.  Participation Level:  Active  Participation Quality:  Appropriate  Affect:  Appropriate  Cognitive:  Appropriate  Insight: Appropriate  Engagement in Group:  Engaged  Modes of Intervention:  Support  Additional Comments:  Pt stated that although she had some intensive test done today that the positive thing that happened today is that everything came out normal and her goal for tomorrow is to hopefully go home. Her advice to her peers is to actually use the exercise they learn here because today she was able to use the deep breathing to help her stay calm. Pt was given support to continue to do so  Paden Senger 09/07/2013, 11:04 PM

## 2013-09-07 NOTE — [facility] Group Notes (Signed)
[facility]   09/07/2013 1:21 PM    Participation Quality:  Appropraite  Mood/Affect:  Appropriate  Depression Rating:  1  Anxiety Rating:  1  Thoughts of Suicide:  No  Will you contract for safety?   NA  Current AVH:  No  Plan for Discharge/Comments:  Patient attended discharge planning group and actively participated in group. She reports doing well and hopes to discharge soon.  [facility] .   CSW provided all participants with daily workbook.   Transportation Means: Patient has transportation.   Supports:  Patient has a support system.   Rebecca [name], Rebecca [name]

## 2013-09-08 DIAGNOSIS — F329 Major depressive disorder, single episode, unspecified: Secondary | ICD-10-CM

## 2013-09-08 MED ORDER — HYDROXYZINE HCL 25 MG PO TABS: ORAL_TABLET | ORAL | Status: DC

## 2013-09-08 MED ORDER — ARIPIPRAZOLE 5 MG PO TABS: 5.0000 mg | ORAL_TABLET | Freq: Every day | ORAL | Status: DC

## 2013-09-08 MED ORDER — PAROXETINE HCL 40 MG PO TABS: 40.0000 mg | ORAL_TABLET | Freq: Every day | ORAL | Status: DC

## 2013-09-08 MED ORDER — FLUTICASONE-SALMETEROL 115-21 MCG/ACT IN AERO: 2.0000 | INHALATION_SPRAY | Freq: Two times a day (BID) | RESPIRATORY_TRACT | Status: DC

## 2013-09-08 MED ORDER — BUPROPION HCL ER (XL) 150 MG PO TB24: 150.0000 mg | ORAL_TABLET | Freq: Every morning | ORAL | Status: DC

## 2013-09-08 MED ORDER — IBUPROFEN 200 MG PO TABS: 600.0000 mg | ORAL_TABLET | Freq: Four times a day (QID) | ORAL | Status: DC | PRN

## 2013-09-08 MED ORDER — ROPINIROLE HCL 0.5 MG PO TABS: 0.5000 mg | ORAL_TABLET | Freq: Every day | ORAL | Status: DC

## 2013-09-08 MED ORDER — GABAPENTIN 300 MG PO CAPS: 300.0000 mg | ORAL_CAPSULE | Freq: Two times a day (BID) | ORAL | Status: DC

## 2013-09-08 MED ORDER — ALBUTEROL SULFATE HFA 108 (90 BASE) MCG/ACT IN AERS: 2.0000 | INHALATION_SPRAY | RESPIRATORY_TRACT | Status: DC | PRN

## 2013-09-08 MED ORDER — TRAZODONE HCL 50 MG PO TABS: 50.0000 mg | ORAL_TABLET | Freq: Every evening | ORAL | Status: DC | PRN

## 2013-09-08 MED ORDER — PANTOPRAZOLE SODIUM 40 MG PO TBEC: 40.0000 mg | DELAYED_RELEASE_TABLET | Freq: Every day | ORAL | Status: DC

## 2013-09-08 NOTE — Progress Notes (Signed)
Discharge Note:  Patient discharged home with family member.  Denied SI and HI.  Denied A/V hallucinations.  Suicide prevention information given and discussed with patient who stated she understood and had no questions.  Patient stated she received all her belongings, clothing, toiletries, misc items, prescriptions, medications, white pants, brown/pink bag, cell phone charger.  Patient stated she appreciated all assistance received [facility].

## 2013-09-08 NOTE — Progress Notes (Signed)
D:  Patient's self inventory sheet, patient sleeps good, takes sleep medication which is helpful.  Good appetite, low energy level, good concentration.  Denied depression, hopeless, anxiety.  Denied withdrawals.  Denied SI.  Stated she has experienced pain in past 24 hours, worst pain #2 in lower back.  Motrin does help the pain.  Goal is to complete discharge planning, follow up with appointments, medications.  Plans to participate in after care.  Plans to return home after discharge, excited to leave.  Will take her medications correctly.   A:  Medications administered per MD orders.  Emotional support and encouragement given patient. R:  Denied SI and HI.  Denied A/V hallucinations.   Safety maintained with 15 minute checks. Patient is excited to be discharge and return home today.

## 2013-09-08 NOTE — [facility] Suicide Risk Assessment (Signed)
[facility] INPATIENT:  Family/Significant Other Suicide Prevention Education  Suicide Prevention Education:  Education Completed;Rebecca [name], Husband, [phone];  has been identified by the patient as the family member/significant other with whom the patient will be residing, and identified as the person(s) who will aid the patient in the event of a mental health crisis (suicidal ideations/suicide attempt).  With written consent from the patient, the family member/significant other has been provided the following suicide prevention education, prior to the and/or following the discharge of the patient.  The suicide prevention education provided includes the following:  Suicide risk factors  Suicide prevention and interventions  National Suicide Hotline telephone number  [facility] assessment telephone number  [facility] Emergency Assistance 911  [facility] and/or Residential Mobile Crisis Unit telephone number  Request made of family/significant other to:  Remove weapons (e.g., guns, rifles, knives), all items previously/currently identified as safety concern.  Husband advised patient does not have access to weapons.    Remove drugs/medications (over-the-counter, prescriptions, illicit drugs), all items previously/currently identified as a safety concern.  The family member/significant other verbalizes understanding of the suicide prevention education information provided.  The family member/significant other agrees to remove the items of safety concern listed above.  Rebecca [name], Rebecca [name] 09/08/2013, 3:43 PM

## 2013-09-08 NOTE — Progress Notes (Signed)
The focus of this group is to educate the patient on the purpose and policies of crisis stabilization and provide a format to answer questions about their admission.  The group details unit policies and expectations of patients while admitted.  Patient attended 0900 nurse education orientation group this morning.  Patient actively participated, appropriate affect, alert, appropriate insight and engagement.  Today patient will work on his goals.

## 2013-09-08 NOTE — Discharge Summary (Signed)
Physician Discharge Summary Note  Patient:  Rebecca HurtKimberly G [name] is an 58 y.o., female MRN:  [id number] DOB:  Jan 24, 1956 Patient phone:  [phone] (home)  Patient address:   [address] [place] [id number],  Total Time spent with patient: Greater than 30 minutes  Date of Admission:  09/04/2013 Date of Discharge: 09/08/13  Reason for Admission: Mood stabilization  Discharge Diagnoses: Active Problems:   Asthma, chronic   Morbid obesity   MDD (major depressive disorder)   Lower extremity edema   Orthopnea   Psychiatric Specialty Exam: Physical Exam  Psychiatric: Her speech is normal and behavior is normal. Judgment and thought content normal. Her mood appears not anxious. Her affect is not angry, not blunt, not labile and not inappropriate. Cognition and memory are normal. She does not exhibit a depressed mood.    Review of Systems  Constitutional: Negative.   HENT: Negative.   Eyes: Negative.   Respiratory: Negative.   Cardiovascular: Negative.   Gastrointestinal: Negative.   Genitourinary: Negative.   Musculoskeletal: Negative.   Skin: Negative.   Neurological: Negative.   Endo/Heme/Allergies: Negative.   Psychiatric/Behavioral: Positive for depression (Stable). Negative for suicidal ideas, hallucinations, memory loss and substance abuse. The patient has insomnia (Stable). The patient is not nervous/anxious.     Blood pressure 134/85, pulse 74, temperature 97.9 F (36.6 C), temperature source Oral, resp. rate 16, height 5\' 4"  (1.626 m), weight 120.43 kg (265 lb 8 oz).Body mass index is 45.55 kg/(m^2).   Past Psychiatric History: Diagnosis: MDD (major depressive disorder)  Hospitalizations: [facility] adult unit  [facility] [place]  Substance Abuse Care: NA  Self-Mutilation: NA  Suicidal Attempts: NA  Violent Behaviors: NA   Musculoskeletal: Strength & Muscle Tone: within normal limits Gait & Station: normal Patient leans:  N/A  DSM5: Schizophrenia Disorders:  NA Obsessive-Compulsive Disorders:  NA Trauma-Stressor Disorders:  NA Substance/Addictive Disorders:  NA Depressive Disorders:  MDD (major depressive disorder)  Axis Diagnosis:  AXIS I:  MDD (major depressive disorder) AXIS II:  Deferred AXIS III:   Past Medical History  Diagnosis Date  . Asthma   . Depression   . Reflux    AXIS IV:  other psychosocial or environmental problems and mental illness, chronic AXIS V:  63  Level of Care:  OP  Hospital Course:  58 year old female.  She recently Overdosed on Ambien ( about 15 ) and some Oxycodone that were her husbands. States she had been contemplating suicide for a few days and wrote a suicide note .  Reports multiple losses over recent months- last October her 57 year old pet dog died, and aunts who she was very close to died in November and December 14. A good friend died in February, 2015.  She has been feeling depressed for several months, which she attributes to her losses.  Rebecca [name] was admitted to the hospital for mood stabilization treatments. She apparently attempted suicide by overdose. Her suicidal ideations and attempt was triggered by worsening depression associated with multiple loses that she experience dover the last few months.   While a patient in this hospital, Rebecca [name] was medicated and discharged on Abilify 5 mg for mood control, Wellbutrin Xll 150 mg for depression, Paroxetine 40 mg for depression/anxiety, gabapentin 300 mg for agitation and Trazodone 50 mg for sleep. She also was enrolled and participated in the group counseling sessions being offered and held on this unit. She learned coping skills. She was resumed on all her  pertinent home medications for her other medical issues that she presented. She tolerated her treatment regimen without any adverse effects.  [name]'s mood is stabilized. This is evidenced by her reports of improved mood, presentation of good affects/eye  contact and absence of suicidal ideations. She is currently being discharged to continue psychiatric care on an outpatient basis at the [facility] in [place] with Dr. Tenny Craw and Elon Spanner. She is provided with all the pertinent information required to make this appointment without problems. Upon discharge, she adamantly denies any SIHI, AVH, delusional thoughts and paranoia. She received from the [facility] pharmacist, a 4 days worth, supply samples of her [facility] discharge medications. She left [facility] with all personal belongings in no distress. Transportation per husband.  Consults:  psychiatry  Significant Diagnostic Studies:  labs: CBC with diff, CMP, UDS, toxicology tests, U/A  Discharge Vitals:   Blood pressure 134/85, pulse 74, temperature 97.9 F (36.6 C), temperature source Oral, resp. rate 16, height 5\' 4"  (1.626 m), weight 120.43 kg (265 lb 8 oz). Body mass index is 45.55 kg/(m^2). Lab Results:   Results for orders placed during the hospital encounter of 09/04/13 (from the past 72 hour(s))  URINALYSIS, ROUTINE W REFLEX MICROSCOPIC     Status: None   Collection Time    09/07/13 12:00 AM      Result Value Ref Range   Color, Urine YELLOW  YELLOW   APPearance CLEAR  CLEAR   Specific Gravity, Urine 1.013  1.005 - 1.030   pH 7.0  5.0 - 8.0   Glucose, UA NEGATIVE  NEGATIVE mg/dL   Hgb urine dipstick NEGATIVE  NEGATIVE   Bilirubin Urine NEGATIVE  NEGATIVE   Ketones, ur NEGATIVE  NEGATIVE mg/dL   Protein, ur NEGATIVE  NEGATIVE mg/dL   Urobilinogen, UA 0.2  0.0 - 1.0 mg/dL   Nitrite NEGATIVE  NEGATIVE   Leukocytes, UA NEGATIVE  NEGATIVE   Comment: MICROSCOPIC NOT DONE ON URINES WITH NEGATIVE PROTEIN, BLOOD, LEUKOCYTES, NITRITE, OR GLUCOSE <1000 mg/dL.     Performed at [facility]    Physical Findings: AIMS: Facial and Oral Movements Muscles of Facial Expression: None, normal Lips and Perioral Area: None, normal Jaw: None, normal Tongue: None, normal,Extremity  Movements Upper (arms, wrists, hands, fingers): None, normal Lower (legs, knees, ankles, toes): None, normal, Trunk Movements Neck, shoulders, hips: None, normal, Overall Severity Severity of abnormal movements (highest score from questions above): None, normal Incapacitation due to abnormal movements: None, normal Patient's awareness of abnormal movements (rate only patient's report): No Awareness, Dental Status Current problems with teeth and/or dentures?: No Does patient usually wear dentures?: No  CIWA:  CIWA-Ar Total: 0 COWS:  COWS Total Score: 1  Psychiatric Specialty Exam: See Psychiatric Specialty Exam and Suicide Risk Assessment completed by Attending Physician prior to discharge.  Discharge destination:  Home  Is patient on multiple antipsychotic therapies at discharge:  No   Has Patient had three or more failed trials of antipsychotic monotherapy by history:  No  Recommended Plan for Multiple Antipsychotic Therapies: NA    Medication List    STOP taking these medications       ALPRAZolam 0.5 MG tablet  Commonly known as:  XANAX     HYDROcodone-acetaminophen 7.5-325 MG per tablet  Commonly known as:  NORCO     PARoxetine 25 MG 24 hr tablet  Commonly known as:  PAXIL-CR  Replaced by:  PARoxetine 40 MG tablet     traMADol 50 MG tablet  Commonly known as:  ULTRAM     zolpidem 10 MG tablet  Commonly known as:  AMBIEN      TAKE these medications     Indication   albuterol 108 (90 BASE) MCG/ACT inhaler  Commonly known as:  PROVENTIL HFA;VENTOLIN HFA  Inhale 2 puffs into the lungs every 4 (four) hours as needed for wheezing or shortness of breath.   Indication:  Asthma     ARIPiprazole 5 MG tablet  Commonly known as:  ABILIFY  Take 1 tablet (5 mg total) by mouth daily. For mood control   Indication:  Mood control     buPROPion 150 MG 24 hr tablet  Commonly known as:  WELLBUTRIN XL  Take 1 tablet (150 mg total) by mouth every morning. For depression    Indication:  Major Depressive Disorder     fluticasone-salmeterol 115-21 MCG/ACT inhaler  Commonly known as:  ADVAIR HFA  Inhale 2 puffs into the lungs 2 (two) times daily. For shortness of breath   Indication:  Asthma     gabapentin 300 MG capsule  Commonly known as:  NEURONTIN  Take 1 capsule (300 mg total) by mouth 2 (two) times daily. For agitation/pain managment   Indication:  Agitation, Pain     hydrOXYzine 25 MG tablet  Commonly known as:  ATARAX/VISTARIL  Take 1 tablet (25 mg) three times daily as needed: For anxiety   Indication:  Tension, Anxiety     ibuprofen 200 MG tablet  Commonly known as:  ADVIL,MOTRIN  Take 3 tablets (600 mg total) by mouth every 6 (six) hours as needed for mild pain.   Indication:  Mild to Moderate Pain     pantoprazole 40 MG tablet  Commonly known as:  PROTONIX  Take 1 tablet (40 mg total) by mouth daily. For acid reflux   Indication:  Gastroesophageal Reflux Disease     PARoxetine 40 MG tablet  Commonly known as:  PAXIL  Take 1 tablet (40 mg total) by mouth daily. For depression   Indication:  Major Depressive Disorder     rOPINIRole 0.5 MG tablet  Commonly known as:  REQUIP  Take 1 tablet (0.5 mg total) by mouth at bedtime. For restless leg syndrome   Indication:  Restless Leg Syndrome     traZODone 50 MG tablet  Commonly known as:  DESYREL  Take 1 tablet (50 mg total) by mouth at bedtime as needed and may repeat dose one time if needed for sleep.   Indication:  Trouble Sleeping       Follow-up Information   Follow up with Florencia Reasons - [facility] On 09/19/2013. (Monday, September 19, 2013 at 10 AM)    Contact information:   [address] [place], [place]  [address]  [phone]      Follow up with Dr. Tenny Craw - [facility] On 09/22/2013. (Thursday, September 22, 2013 at 2:45 PM)    Contact information:   [address] [place], [place]   [address]    Follow-up recommendations: Activity:  As tolerated Diet: As  recommended by your primary care doctor. Keep all scheduled follow-up appointments as recommended.   Comments: Take all your medications as prescribed by your mental healthcare provider. Report any adverse effects and or reactions from your medicines to your outpatient provider promptly. Patient is instructed and cautioned to not engage in alcohol and or illegal drug use while on prescription medicines. In the event of worsening symptoms, patient is instructed to  call the crisis hotline, 911 and or go to the nearest ED for appropriate evaluation and treatment of symptoms. Follow-up with your primary care provider for your other medical issues, concerns and or health care needs.    Total Discharge Time:  Greater than 30 minutes.  Signed: Sanjuana Kava, PMHNP-BC 09/09/2013, 1:26 PM  Patient seen, Suicide Assessment Completed.  Disposition Plan Reviewed

## 2013-09-08 NOTE — Progress Notes (Signed)
Pt stable for discharge from internal medicine stand point. No changes to medical regimen. Will sign off  Debbora PrestoMAGICK-Janeli Lewison, MD  [facility] Pager [phone] Cell [phone]  If 7PM-7AM, please contact night-coverage [email or web address] Password [email or web address]

## 2013-09-08 NOTE — Progress Notes (Signed)
[facility] Discharge [facility] :  Will you be returning to the same living situation after discharge: Yes,  Patient is returning home with family At discharge, do you have transportation home?:Yes,  Husband to transport patient home. Do you have the ability to pay for your medications:Yes,  Patient is able to obtain medications.  Release of information consent forms completed and in the chart;  Patient's signature needed at discharge.  Patient to Follow up at: Follow-up Information   Follow up with Florencia Reasonseggy Bynum - [facility] On 09/19/2013. (Monday, September 19, 2013 at 10 AM)    Contact information:   [address] [place], [place]  [id number]  [phone]      Follow up with Dr. Tenny Crawoss - [facility] On 09/22/2013. (Thursday, September 22, 2013 at 2:45 PM)    Contact information:   [address] [place], [place]   [id number]      Patient denies SI/HI:  Patient no longer endorsing SI/HI or other thoughts of self harm.   Safety Planning and Suicide Prevention discussed: .Reviewed with all patients during discharge planning group   Dashana Guizar, Joesph JulyQuylle Hairston 09/08/2013, 10:51 AM

## 2013-09-08 NOTE — [facility] Suicide Risk Assessment (Signed)
Demographic Factors:  58 year old female, married, [place], has one daughter. Patient is Charity fundraiserN.  Total Time spent with patient: 30 minutes  Psychiatric Specialty Exam: Physical Exam  ROS  Blood pressure 138/84, pulse 85, temperature 97.9 F (36.6 C), temperature source Oral, resp. rate 16, height 5\' 4"  (1.626 m), weight 120.43 kg (265 lb 8 oz).Body mass index is 45.55 kg/(m^2).  General Appearance: Well Groomed  Patent attorneyye Contact::  Good  Speech:  Normal Rate  Volume:  Normal  Mood:  Euthymic  Affect:  Appropriate  Thought Process:  Goal Directed and Linear  Orientation:  NA- fully alert and attentive  Thought Content:  denies hallucinations, no delusions expressed   Suicidal Thoughts:  No- denies any self injurious or suicidal ideations  Homicidal Thoughts:  No  Memory:  NA  Judgement:  Good  Insight:  Fair  Psychomotor Activity:  Normal  Concentration:  Good  Recall:  Good  Fund of Knowledge:Good  Language: Good  Akathisia:  Negative  Handed:  Right  AIMS (if indicated):     Assets:  Communication Skills Desire for Improvement Housing Resilience Social Support  Sleep:  Number of Hours: 4.5    Musculoskeletal: Strength & Muscle Tone: within normal limits Gait & Station: normal Patient leans: N/A   Mental Status Per Nursing Assessment::   On Admission:  Suicide plan  Current Mental Status by Physician: At this time patient is presenting with an unremarkable MSE- her mood is euthymic, her affect is appropriate, and she has no psychotic symptoms. She is not suicidal , she is not homicidal , and she is future oriented, looking forward to reuniting with her loved ones.  Loss Factors: Recent deaths of pet dog, family member, friend  Historical Factors: Prior suicide attempts  Risk Reduction Factors:   Sense of responsibility to family, Employed, Positive social support and Positive coping skills or problem solving skills  Continued Clinical Symptoms:  At  this time patient is not presenting with depressive symptoms and seems euthymic  Cognitive Features That Contribute To Risk:  No gross cognitive deficits are noted  Suicide Risk:  Mild:  Suicidal ideation of limited frequency, intensity, duration, and specificity.  There are no identifiable plans, no associated intent, mild dysphoria and related symptoms, good self-control (both objective and subjective assessment), few other risk factors, and identifiable protective factors, including available and accessible social support.  Discharge Diagnoses:   AXIS I:  Alcohol Abuse and Major Depression, Recurrent severe AXIS II:  Deferred AXIS III:   Past Medical History  Diagnosis Date  . Asthma   . Depression   . Reflux    AXIS IV:  recent deaths of loved ones AXIS V:  61-70 mild symptoms  ( approximately 65)   Plan Of Care/Follow-up recommendations:  Activity:  As tolerated Diet:  Regular  Tests:  NA Other:  See below  Is patient on multiple antipsychotic therapies at discharge:  No   Has Patient had three or more failed trials of antipsychotic monotherapy by history:  No  Recommended Plan for Multiple Antipsychotic Therapies: NA  Patient is leaving our unit in good spirits. Plans to return home. [facility] [place] with [facility] psychiatrist, Dr. Tenny Crawoss, and with a therapist. Initial appt 8/17. Plans to follow up with Cain Saupeammie Fulp, NP, in [facility]. We discussed the importance of full abstinence from alcohol as part of treatment efforts.    COBOS, FERNANDO 09/08/2013, 10:11  AM

## 2013-09-12 NOTE — Progress Notes (Signed)
Patient Discharge Instructions:  Next Level Care Provider Has Access to the EMR, 09/12/13 Records provided [facility] via [facility]/Epic access.  Jerelene ReddenSheena E Ahtanum, 09/12/2013, 2:10 PM

## 2013-09-19 ENCOUNTER — Encounter (HOSPITAL_COMMUNITY): Payer: Self-pay | Admitting: Psychiatry

## 2013-09-19 ENCOUNTER — Ambulatory Visit (INDEPENDENT_AMBULATORY_CARE_PROVIDER_SITE_OTHER): Payer: 59 | Admitting: Psychiatry

## 2013-09-19 DIAGNOSIS — F333 Major depressive disorder, recurrent, severe with psychotic symptoms: Secondary | ICD-10-CM

## 2013-09-19 NOTE — Patient Instructions (Signed)
Discussed orally 
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
## 2013-09-22 ENCOUNTER — Encounter (HOSPITAL_COMMUNITY): Payer: Self-pay | Admitting: Psychiatry

## 2013-09-22 ENCOUNTER — Ambulatory Visit (INDEPENDENT_AMBULATORY_CARE_PROVIDER_SITE_OTHER): Payer: 59 | Admitting: Psychiatry

## 2013-09-22 VITALS — BP 145/84 | HR 82 | Ht 64.0 in | Wt 261.2 lb

## 2013-09-22 DIAGNOSIS — F332 Major depressive disorder, recurrent severe without psychotic features: Secondary | ICD-10-CM

## 2013-09-22 DIAGNOSIS — F333 Major depressive disorder, recurrent, severe with psychotic symptoms: Secondary | ICD-10-CM

## 2013-09-22 MED ORDER — TRAZODONE HCL 50 MG PO TABS: 50.0000 mg | ORAL_TABLET | Freq: Every evening | ORAL | Status: DC | PRN

## 2013-09-22 MED ORDER — ARIPIPRAZOLE 5 MG PO TABS: 5.0000 mg | ORAL_TABLET | Freq: Every day | ORAL | Status: DC

## 2013-09-22 MED ORDER — GABAPENTIN 300 MG PO CAPS: 300.0000 mg | ORAL_CAPSULE | Freq: Two times a day (BID) | ORAL | Status: DC

## 2013-09-22 MED ORDER — BUPROPION HCL ER (XL) 150 MG PO TB24: 150.0000 mg | ORAL_TABLET | Freq: Every morning | ORAL | Status: DC

## 2013-09-22 MED ORDER — PAROXETINE HCL 40 MG PO TABS: 40.0000 mg | ORAL_TABLET | Freq: Every day | ORAL | Status: DC

## 2013-09-22 MED ORDER — HYDROXYZINE HCL 25 MG PO TABS: ORAL_TABLET | ORAL | Status: DC

## 2013-09-22 NOTE — Progress Notes (Signed)
Psychiatric Assessment Adult  Patient Identification:  Rebecca [name] Date of Evaluation:  09/22/2013 Chief Complaint: I just got out of the psychiatric hospital with depression History of Chief Complaint:   Chief Complaint  Patient presents with  . Anxiety  . Depression  . Follow-up    Anxiety Symptoms include nervous/anxious behavior.     this patient is a 58 year old married white female who lives with her husband in [place]. 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 She has one daughter and 2 grandchildren  The patient was [facility] where she was hospitalized July 31 through August 6 after an intentional overdose on Ambien and oxycodone. The patient has had one prior overdose attempt in 2004. This occurred after she found that her first husband had had an affair with her sister years prior.  After the hospital patient 2004 the patient was followed for time by [facility] in [place]. She did fairly well on Paxil CR. Most recently she has been treated [facility] group primary care and Abilify was added. Over the last year however she has lost many important people in her life. 2 of her aunt her best friend another friend and her dog all died within a short period of time. She was going downhill and getting more and more depressed and was dealing with that by drinking 2-3 ounces of liquor per day. She got increasingly despondent and eventually decided to take her life and rose suicide note to her family. Reportedly she didn't died was found the next morning and brought to the ER in the ICU and consequently to the behavioral health hospital.  While there are Wellbutrin and trazodone were added. The patient wasn't sleeping well prior to hospitalization but she sleeping much better now. Her mood is improved but her energy and motivation are still poor and she's developed tremor in  both hands predicted on the left. 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 It's hard for her to type because of the tremor and she can't remember anything. She's no longer suicidal but just doesn't have much energy to do much of anything right now. She's denies auditory visual sensations or paranoia. She is no longer drinking Review of Systems  Constitutional: Positive for activity change.  HENT: Negative.   Respiratory: Negative.   Cardiovascular: Negative.   Gastrointestinal: Negative.   Endocrine: Negative.   Genitourinary: Negative.   Musculoskeletal: Positive for arthralgias and joint swelling.  Allergic/Immunologic: Negative.   Neurological: Positive for tremors.  Hematological: Negative.   Psychiatric/Behavioral: Positive for dysphoric mood. The patient is nervous/anxious.    Physical Examnot done  Depressive Symptoms: depressed mood, anhedonia, insomnia, difficulty concentrating, anxiety, loss of energy/fatigue,  (Hypo) Manic Symptoms:   Elevated Mood:  No Irritable Mood:  No Grandiosity:  No Distractibility:  Yes Labiality of Mood:  Yes Delusions:  No Hallucinations:  No Impulsivity:  No Sexually Inappropriate Behavior:  No Financial Extravagance:  No Flight of Ideas:  No  Anxiety Symptoms: Excessive Worry:  Yes Panic Symptoms:  Yes Agoraphobia:  No Obsessive Compulsive: No  Symptoms: None, Specific Phobias:  No Social Anxiety:  Yes  Psychotic Symptoms:  Hallucinations: No None Delusions:  No Paranoia:  No   Ideas of Reference:  No  PTSD Symptoms: Ever had a traumatic exposure:  No Had  a traumatic exposure in the last month:  No Re-experiencing: No None Hypervigilance:  No Hyperarousal: No None Avoidance: No None  Traumatic Brain Injury: No   Past Psychiatric History: Diagnosis: Maj. depression   Hospitalizations: In 2004 and again last  month   Outpatient Care: At [facility] in the past   Substance Abuse Care: none  Self-Mutilation: none  Suicidal Attempts: Overdose attempts twice   Violent Behaviors:none   Past Medical History:   Past Medical History  Diagnosis Date  . Asthma   . Depression   . Reflux   . Chronic pain began in 2011 after jooint replacements   History of Loss of Consciousness:  Yes Seizure History:  No Cardiac History:  No Allergies:   Allergies  Allergen Reactions  . Sulfa Antibiotics Other (See Comments)    unknown   Current Medications:  Current Outpatient Prescriptions  Medication Sig Dispense Refill  . albuterol (PROVENTIL HFA;VENTOLIN HFA) 108 (90 BASE) MCG/ACT inhaler Inhale 2 puffs into the lungs every 4 (four) hours as needed for wheezing or shortness of breath.      . ARIPiprazole (ABILIFY) 5 MG tablet Take 1 tablet (5 mg total) by mouth daily. For mood control  30 tablet  2  . buPROPion (WELLBUTRIN XL) 150 MG 24 hr tablet Take 1 tablet (150 mg total) by mouth every morning. For depression  30 tablet  2  . fluticasone-salmeterol (ADVAIR HFA) 115-21 MCG/ACT inhaler Inhale 2 puffs into the lungs 2 (two) times daily. For shortness of breath  1 Inhaler  12  . gabapentin (NEURONTIN) 300 MG capsule Take 1 capsule (300 mg total) by mouth 2 (two) times daily. For agitation/pain managment  60 capsule  2  . hydrOXYzine (ATARAX/VISTARIL) 25 MG tablet Take 1 tablet (25 mg) three times daily as needed: For anxiety  90 tablet  2  . ibuprofen (ADVIL,MOTRIN) 200 MG tablet Take 800 mg by mouth every 8 (eight) hours.      . pantoprazole (PROTONIX) 40 MG tablet Take 1 tablet (40 mg total) by mouth daily. For acid reflux      . PARoxetine (PAXIL) 40 MG tablet Take 1 tablet (40 mg total) by mouth daily. For depression  30 tablet  2  . rOPINIRole (REQUIP) 0.5 MG tablet Take 1 tablet (0.5 mg total) by mouth at bedtime. For restless leg syndrome      . traZODone (DESYREL) 50 MG tablet Take 1 tablet (50  mg total) by mouth at bedtime as needed and may repeat dose one time if needed for sleep.  60 tablet  2   No current facility-administered medications for this visit.    Previous Psychotropic Medications:  Medication Dose   Paxil CR, Ambien                        Substance Abuse History in the last 12 months: Substance Age of 1st Use Last Use Amount Specific Type  Nicotine      Alcohol    was drinking 2-3 ounces of liquor per day until about 3 weeks ago    Cannabis      Opiates      Cocaine      Methamphetamines      LSD      Ecstasy      Benzodiazepines      Caffeine      Inhalants      Others:  Medical Consequences of Substance Abuse: none  Legal Consequences of Substance Abuse: none  Family Consequences of Substance Abuse: none  Blackouts:  No DT's:  No Withdrawal Symptoms:  No None  Social History: Current Place of Residence: [place] [address] [place] Place of Birth: [place] Family Members: Husband daughter 2 grandchildren Marital Status:  Married Children:   Sons:   Daughters: 1 Relationships: Education:  [employer] Educational Problems/Performance:  Religious Beliefs/Practices: Christian History of Abuse: none Armed forces technical officerccupational Experiences; 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 Legal History: none Hobbies/Interests: Animals gardening painting  Family History:   Family History  Problem Relation Age of Onset  . Deep vein thrombosis Neg Hx   . Pulmonary embolism Neg Hx   . Heart disease Neg Hx   . Heart failure Neg Hx   . Bipolar disorder Sister   . Alcohol abuse Maternal Grandfather     Mental Status Examination/Evaluation: Objective:  Appearance: Casual and Well Groomed tremors in both hands when she lifts some   Eye Contact::  Good  Speech:  Clear and Coherent  Volume:  Decreased  Mood:  Tearful and anxious somewhat depressed   Affect:  Constricted, Depressed and Tearful  Thought Process:   Coherent  Orientation:  Full (Time, Place, and Person)  Thought Content:  Rumination  Suicidal Thoughts:  No  Homicidal Thoughts:  No  Judgement:  Good  Insight:  Good  Psychomotor Activity:  Tremor  Akathisia:  No  Handed:  Right  AIMS (if indicated):    Assets:  Communication Skills Desire for Improvement Resilience Social Support Talents/Skills    Laboratory/X-Ray Psychological Evaluation(s)   Reviewed in chart      Assessment:  Axis I: Major Depression, Recurrent severe  AXIS I Major Depression, Recurrent severe  AXIS II Deferred  AXIS III Past Medical History  Diagnosis Date  . Asthma   . Depression   . Reflux   . Chronic pain began in 2011 after jooint replacements     AXIS IV other psychosocial or environmental problems  AXIS V 51-60 moderate symptoms   Treatment Plan/Recommendations:  Plan of Care: Medication management   Laboratory:    Psychotherapy: She is started seeing Florencia ReasonsPeggy Bynum here   Medications: For now should like to leave her medications the same and because she feels it helped her depression. I'm not sure that her tremor is from anxiety and/or too many serotonergic medicines. She will let me know right away if it gets any worse   Routine PRN Medications:  No  Consultations:   Safety Concerns:  She denies current thoughts of self-harm   Other: She'll return in four-weeks     Diannia RuderOSS, Levita Monical, MD 8/20/20153:34 PM

## 2013-10-03 ENCOUNTER — Ambulatory Visit (INDEPENDENT_AMBULATORY_CARE_PROVIDER_SITE_OTHER): Payer: 59 | Admitting: Psychiatry

## 2013-10-03 DIAGNOSIS — F333 Major depressive disorder, recurrent, severe with psychotic symptoms: Secondary | ICD-10-CM

## 2013-10-04 ENCOUNTER — Telehealth (HOSPITAL_COMMUNITY): Payer: Self-pay | Admitting: *Deleted

## 2013-10-04 NOTE — Telephone Encounter (Signed)
Pt came in requesting medication. Per pt chart, Trazodone, Paroxetine, hydroxyzine, bupropine, Aripiprazole was sent into her pharmacy 09-22-13 with 2 refills. Called pt and informed her that our records showed her meds was electronically sent to her pharmacy. Pt showed understanding. Per Pt Pharmacy, they received pt medications 09-22-13.

## 2013-10-04 NOTE — Progress Notes (Signed)
   THERAPIST PROGRESS NOTE  Session Time: Monday 10/03/2013 2:00 PM - 2:50 PM  Participation Level: Active  Behavioral Response: CasualAlertDepressed  Type of Therapy: Individual Therapy/psychoeducation  Treatment Goals addressed: Establish therapeutic alliance, resume normal energy level and interest in activity  Interventions: Supportive  Summary: Rebecca [name] is a 58 y.o. female who is referred for follow up treatment upon discharge from hospitalization at the [facility] where she was treated fr om 09/04/2013 -09/08/2013 for depression and a suicide attempt. Per patient's report, this was triggered by 6 major deaths in the past 6 months. These deaths include the loss of a best friend, 2 aunts, another friend, and her 74 year old dog, a Rushie Chestnut. Patient reports writing a 3-page suicide note and using liquor along with taking a half bottle of Ambien and a half bottle of her husband's oxycodone. She denies any suicidal ideations since discharge but reports feeling depressed and down like a cloud is over her head. She has crying spells daily and reports no interest in previously enjoyed activities/ She is experiencing poor concentration along with memory difficulty and states forgetting things like when she took her medication or what she ate.She is experiencing anxiety and worry about returning to work. 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 She also expresses concern regarding her speech stating difficulty and sometimes feeling like she is dyslexic. She also reports stress related to marriage as husband suffers from chronic pain due to severe peripheral neuropathy and is often very irritable. 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   Patient reports little to no change in symptoms since last session. She reports continued sleep difficulty (only sleeps about 4 hours per night), no motivation, low energy,loss of interest in activities, and daily crying spells. She reports mainly sitting at her computer during the day.She continues to express frustration and anxiety about speech difficulty and says  her words become mixed up as she places words in the wrong place in a sentence. She also expresses anxiety about her memory and a recent incident when she became lost this past Sunday while traveling a road of which patient is very familiar.   Suicidal/Homicidal: No  Therapist Response: Therapist works with patient to identify and verbalize feelings, provides psychoeducation regarding depression, identify ways to improve self-care, identify ways to improve sleep hygiene, identify ways to improve daily routine and increase involvement in activity  Plan: Return again in 2 weeks. Patient agrees to use plan your day  handouts and bring to next session.  Diagnosis: Axis I: Major Depression, Recurrent severe    Axis II: No diagnosis    Sicily Zaragoza, LCSW 10/04/2013

## 2013-10-04 NOTE — Patient Instructions (Signed)
Discussed orally 

## 2013-10-21 ENCOUNTER — Ambulatory Visit (INDEPENDENT_AMBULATORY_CARE_PROVIDER_SITE_OTHER): Payer: 59 | Admitting: Psychiatry

## 2013-10-21 DIAGNOSIS — F333 Major depressive disorder, recurrent, severe with psychotic symptoms: Secondary | ICD-10-CM

## 2013-10-21 NOTE — Patient Instructions (Signed)
Discussed orally 

## 2013-10-24 NOTE — Progress Notes (Signed)
   THERAPIST PROGRESS NOTE  Session Time: Friday 10/21/2013 3:10 PM - 4:00 PM  Participation Level: Active  Behavioral Response: CasualAlertAnxious and Depressed  Type of Therapy: Individual Therapy  Treatment Goals addressed:  Alleviate symptoms of depression (crying spells, loss of interest, depressed mood) in return to previous level of functioning      Implement coping skills reducing anxiety and excessive worry along with panic attacks  Interventions: CBT and Supportive  Summary: Rebecca [name] is a 58 y.o. female who is referred for follow up treatment upon discharge from hospitalization at the [facility] where she was treated fr om 09/04/2013 -09/08/2013 for depression and a suicide attempt. Per patient's report, this was triggered by 6 major deaths in the past 6 months. These deaths include the loss of a best friend, 2 aunts, another friend, and her 30 year old dog, a Rushie Chestnut. Patient reports writing a 3-page suicide note and using liquor along with taking a half bottle of Ambien and a half bottle of her husband's oxycodone. She denies any suicidal ideations since discharge but reports feeling depressed and down like a cloud is over her head. She has crying spells daily and reports no interest in previously enjoyed activities/ She is experiencing poor concentration along with memory difficulty and states forgetting things like when she took her medication or what she ate.She is experiencing anxiety and worry about returning to work. 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 She also expresses concern regarding her speech stating difficulty and sometimes feeling like she is dyslexic. She also reports stress related to marriage as husband suffers from chronic pain due to severe  peripheral neuropathy and is often very irritable. 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 with which she is familiar. She also continues to lose her train of thought and is able to recollect what she was going to say. She will discuss this more with psychiatrist Dr. Tenny Craw who discussed possibility of brain scan per patient's report. Patient reports becoming a little more active and states she has been going outside in the afternoons. She has tried to improve sleep hygiene but still is experiencing sleep difficulty. She has resumed attending church but reports having difficulty sitting stlll and experiencing panic-like symptoms. She continues to have daily crying spells. She also expresses worry about recent incident with her neighbors   Suicidal/Homicidal: No  Therapist Response: Therapist works with patient to develop a treatment plan, encourage increased involvement in activity, review ways to improve sleep hygiene, explore  thought patterns, identify coping statements  Plan: Return again in 3 weeks.  Diagnosis: Axis I: Major depressive disorder, recurrent, severe    Axis II: Deferred    Rebecca Beavers, LCSW 10/24/2013

## 2013-10-26 ENCOUNTER — Ambulatory Visit (INDEPENDENT_AMBULATORY_CARE_PROVIDER_SITE_OTHER): Payer: 59 | Admitting: Psychiatry

## 2013-10-26 ENCOUNTER — Encounter (HOSPITAL_COMMUNITY): Payer: Self-pay | Admitting: Psychiatry

## 2013-10-26 VITALS — BP 125/88 | HR 69 | Ht 64.0 in | Wt 259.0 lb

## 2013-10-26 DIAGNOSIS — F333 Major depressive disorder, recurrent, severe with psychotic symptoms: Secondary | ICD-10-CM

## 2013-10-26 DIAGNOSIS — F332 Major depressive disorder, recurrent severe without psychotic features: Secondary | ICD-10-CM

## 2013-10-26 MED ORDER — BUPROPION HCL ER (XL) 300 MG PO TB24: 300.0000 mg | ORAL_TABLET | ORAL | Status: DC

## 2013-10-26 MED ORDER — ARIPIPRAZOLE 5 MG PO TABS: 5.0000 mg | ORAL_TABLET | Freq: Every day | ORAL | Status: DC

## 2013-10-26 MED ORDER — PAROXETINE HCL 20 MG PO TABS: 20.0000 mg | ORAL_TABLET | Freq: Every day | ORAL | Status: DC

## 2013-10-26 MED ORDER — GABAPENTIN 300 MG PO CAPS: 300.0000 mg | ORAL_CAPSULE | Freq: Two times a day (BID) | ORAL | Status: DC

## 2013-10-26 MED ORDER — HYDROXYZINE HCL 25 MG PO TABS: ORAL_TABLET | ORAL | Status: DC

## 2013-10-26 NOTE — Progress Notes (Signed)
Patient ID: Rebecca [name], female   DOB: [birth year], 58 y.o.   MRN: [id number]  Psychiatric Assessment Adult  Patient Identification:  Rebecca [name] Date of Evaluation:  10/26/2013 Chief Complaint: I'm still somewhat depressed History of Chief Complaint:   Chief Complaint  Patient presents with  . Anxiety  . Depression  . Follow-up    Anxiety Symptoms include nervous/anxious behavior.     this patient is a 58 year old married white female who lives with her husband in [place]. 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 She has one daughter and 2 grandchildren  The patient was referred by [facility] where she was hospitalized July 31 through August 6 after an intentional overdose on Ambien and oxycodone. The patient has had one prior overdose attempt in 2004. This occurred after she found that her first husband had had an affair with her sister years prior.  After the hospital patient 2004 the patient was followed for time by [facility] in [place]. She did fairly well on Paxil CR. Most recently she has been treated [facility] and Abilify was added. Over the last year however she has lost many important people in her life. 2 of her aunt her best friend another friend and her dog all died within a short period of time. She was going downhill and getting more and more depressed and was dealing with that by drinking 2-3 ounces of liquor per day. She got increasingly despondent and eventually decided to take her life and rose suicide note to her family. Reportedly she didn't died was found the next morning and brought to the ER in the ICU and consequently to the behavioral health hospital.  While there are Wellbutrin and trazodone were added. The patient wasn't sleeping well prior to hospitalization but she sleeping much better now. Her mood is improved but her energy and  motivation are still poor and she's developed tremor in both hands predicted on the left. 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 It's hard for her to type because of the tremor and she can't remember anything. She's no longer suicidal but just doesn't have much energy to do much of anything right now. She's denies auditory visual sensations or paranoia. She is no longer drinking  The patient returns after four-weeks. In some ways she is doing better. She no longer has the tremor in her hands. However she's been having panic attacks at church and also her mind is wandering when she's driving and at times she gets lost. She's still tearful every day and doesn't feel like her depression is totally resolved. 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 She continues to lose weight which is positive for her. She denies any suicidal ideation. I suggested we increase the Wellbutrin and cut down on Paxil since she's been on it for so long Review of Systems  Constitutional: Positive for activity change.  HENT: Negative.   Respiratory: Negative.   Cardiovascular: Negative.   Gastrointestinal: Negative.   Endocrine: Negative.   Genitourinary: Negative.   Musculoskeletal: Positive for arthralgias and joint swelling.  Allergic/Immunologic: Negative.   Neurological: Positive for tremors.  Hematological: Negative.   Psychiatric/Behavioral: Positive for dysphoric mood. The patient is nervous/anxious.  Physical Examnot done  Depressive Symptoms: depressed mood, anhedonia, insomnia, difficulty concentrating, anxiety, loss of energy/fatigue,  (Hypo) Manic Symptoms:   Elevated Mood:  No Irritable Mood:  No Grandiosity:  No Distractibility:  Yes Labiality of Mood:  Yes Delusions:  No Hallucinations:  No Impulsivity:  No Sexually  Inappropriate Behavior:  No Financial Extravagance:  No Flight of Ideas:  No  Anxiety Symptoms: Excessive Worry:  Yes Panic Symptoms:  Yes Agoraphobia:  No Obsessive Compulsive: No  Symptoms: None, Specific Phobias:  No Social Anxiety:  Yes  Psychotic Symptoms:  Hallucinations: No None Delusions:  No Paranoia:  No   Ideas of Reference:  No  PTSD Symptoms: Ever had a traumatic exposure:  No Had a traumatic exposure in the last month:  No Re-experiencing: No None Hypervigilance:  No Hyperarousal: No None Avoidance: No None  Traumatic Brain Injury: No   Past Psychiatric History: Diagnosis: Maj. depression   Hospitalizations: In 2004 and again last month   Outpatient Care: At [facility] in the past   Substance Abuse Care: none  Self-Mutilation: none  Suicidal Attempts: Overdose attempts twice   Violent Behaviors:none   Past Medical History:   Past Medical History  Diagnosis Date  . Asthma   . Depression   . Reflux   . Chronic pain began in 2011 after jooint replacements   History of Loss of Consciousness:  Yes Seizure History:  No Cardiac History:  No Allergies:   Allergies  Allergen Reactions  . Sulfa Antibiotics Other (See Comments)    unknown   Current Medications:  Current Outpatient Prescriptions  Medication Sig Dispense Refill  . albuterol (PROVENTIL HFA;VENTOLIN HFA) 108 (90 BASE) MCG/ACT inhaler Inhale 2 puffs into the lungs every 4 (four) hours as needed for wheezing or shortness of breath.      . ARIPiprazole (ABILIFY) 5 MG tablet Take 1 tablet (5 mg total) by mouth daily. For mood control  90 tablet  2  . fluticasone-salmeterol (ADVAIR HFA) 115-21 MCG/ACT inhaler Inhale 2 puffs into the lungs 2 (two) times daily. For shortness of breath  1 Inhaler  12  . gabapentin (NEURONTIN) 300 MG capsule Take 1 capsule (300 mg total) by mouth 2 (two) times daily. For agitation/pain managment  180 capsule  2  . hydrOXYzine (ATARAX/VISTARIL) 25 MG tablet  Take 1 tablet (25 mg) three times daily as needed: For anxiety  90 tablet  2  . ibuprofen (ADVIL,MOTRIN) 200 MG tablet Take 800 mg by mouth every 8 (eight) hours.      . pantoprazole (PROTONIX) 40 MG tablet Take 1 tablet (40 mg total) by mouth daily. For acid reflux      . rOPINIRole (REQUIP) 0.5 MG tablet Take 1 tablet (0.5 mg total) by mouth at bedtime. For restless leg syndrome      . traZODone (DESYREL) 50 MG tablet Take 1 tablet (50 mg total) by mouth at bedtime as needed and may repeat dose one time if needed for sleep.  60 tablet  2  . buPROPion (WELLBUTRIN XL) 300 MG 24 hr tablet Take 1 tablet (300 mg total) by mouth every morning.  90 tablet  2  . PARoxetine (PAXIL) 20 MG tablet Take 1 tablet (20 mg total) by mouth daily.  90 tablet  2   No current facility-administered medications for this visit.    Previous Psychotropic Medications:  Medication Dose   Paxil CR, Ambien  Substance Abuse History in the last 12 months: Substance Age of 1st Use Last Use Amount Specific Type  Nicotine      Alcohol    was drinking 2-3 ounces of liquor per day until about 3 weeks ago    Cannabis      Opiates      Cocaine      Methamphetamines      LSD      Ecstasy      Benzodiazepines      Caffeine      Inhalants      Others:                          Medical Consequences of Substance Abuse: none  Legal Consequences of Substance Abuse: none  Family Consequences of Substance Abuse: none  Blackouts:  No DT's:  No Withdrawal Symptoms:  No None  Social History: Current Place of Residence: [place] [address] of Birth: [place] Family Members: Husband daughter 2 grandchildren Marital Status:  Married Children:   Sons:   Daughters: 1 Relationships: Education:  Corporate treasurer Problems/Performance:  Religious Beliefs/Practices: Christian History of Abuse: none Armed forces technical officer; 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 Legal History: none Hobbies/Interests: Animals gardening painting  Family History:   Family History  Problem Relation Age of Onset  . Deep vein thrombosis Neg Hx   . Pulmonary embolism Neg Hx   . Heart disease Neg Hx   . Heart failure Neg Hx   . Bipolar disorder Sister   . Alcohol abuse Maternal Grandfather     Mental Status Examination/Evaluation: Objective:  Appearance: Casual and Well Groomed    Eye Contact::  Good  Speech:  Clear and Coherent  Volume:  Decreased  Mood: Anxious   Affect: Brighter   Thought Process:  Coherent  Orientation:  Full (Time, Place, and Person)  Thought Content:  Rumination  Suicidal Thoughts:  No  Homicidal Thoughts:  No  Judgement:  Good  Insight:  Good  Psychomotor Activity:  Tremor  Akathisia:  No  Handed:  Right  AIMS (if indicated):    Assets:  Communication Skills Desire for Improvement Resilience Social Support Talents/Skills    Laboratory/X-Ray Psychological Evaluation(s)   Reviewed in chart      Assessment:  Axis I: Major Depression, Recurrent severe  AXIS I Major Depression, Recurrent severe  AXIS II Deferred  AXIS III Past Medical History  Diagnosis Date  . Asthma   . Depression   . Reflux   . Chronic pain began in 2011 after jooint replacements     AXIS IV other psychosocial or environmental problems  AXIS V 51-60 moderate symptoms   Treatment Plan/Recommendations:  Plan of Care: Medication management   Laboratory:    Psychotherapy: She is started seeing Florencia Reasons here   Medications: She'll increase Wellbutrin XL to 300 mg every morning and decrease Paxil to 20 mg daily. She'll continue all other medications   Routine PRN Medications:  No  Consultations:   Safety Concerns:  She denies current thoughts of self-harm   Other: She'll return in four-weeks     Diannia Ruder, MD 9/23/20151:23 PM

## 2013-11-11 ENCOUNTER — Ambulatory Visit (INDEPENDENT_AMBULATORY_CARE_PROVIDER_SITE_OTHER): Payer: 59 | Admitting: Psychiatry

## 2013-11-11 DIAGNOSIS — F333 Major depressive disorder, recurrent, severe with psychotic symptoms: Secondary | ICD-10-CM

## 2013-11-11 NOTE — Progress Notes (Signed)
   THERAPIST PROGRESS NOTE  Session Time: Friday 11/11/2013 2:00 PM - 2:53 PM  Participation Level: Active  Behavioral Response: Well GroomedAlertDepressed  Type of Therapy: Individual Therapy  Treatment Goals addressed:  Alleviate symptoms of depression (crying spells, loss of interest, depressed mood) in return to previous level of functioning  Implement coping skills reducing anxiety and excessive worry along with panic attack  Interventions: CBT and Supportive  Summary: Rebecca [name] is a 58 y.o. female who is referred for follow up treatment upon discharge [facility] where she was treated fr om 09/04/2013 -09/08/2013 for depression and a suicide attempt. Per patient's report, this was triggered by 6 major deaths in the past 6 months. These deaths include the loss of a best friend, 2 aunts, another friend, and her 57 year old dog, a [name]. Patient reports writing a 3-page suicide note and using liquor along with taking a half bottle of Ambien and a half bottle of her husband's oxycodone. She denies any suicidal ideations since discharge but reports feeling depressed and down like a cloud is over her head. She has crying spells daily and reports no interest in previously enjoyed activities/ She is experiencing poor concentration along with memory difficulty and states forgetting things like when she took her medication or what she ate.She is experiencing anxiety and worry about returning to work. 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 She also expresses concern regarding her speech stating difficulty and sometimes feeling like she is dyslexic. She also reports stress related to marriage as husband suffers from chronic pain due to severe peripheral  neuropathy and is often very irritable. 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 She experiences decreased jitteriness and shaking since change in medication as recommended by psychiatrist Dr. Tenny Crawoss. However, patient continues to experience memory difficulty and says her speech has worsened. She also reports she hasn't experienced any panic attacks the last 2 times she attended church. Crying spells have decreased from daily occurrences to 2-3 days a week. She also reports improved sleep pattern stating sleeping 5-7 hours per night. Patient has started resuming normal interest in activities and has been selling items on [employer]. She is excited about beginning a craft project next week. She reports she and her neighbors have resolved their differences. Patient reports feeling less depressed but says she continues to experience periods of depression at night. Patient shares today the anniversary of the death of her pet is next Tuesday.      Suicidal/Homicidal: No  Therapist Response: Therapist works with patient to praise her efforts to increase involvement in activity, identify ways to improve self-care identify ways to do activities scheduling especially in the evenings, process grief and loss issues, identify memorialization to cope with the anniversary of the death of her pet,  Plan: Return again in 2 weeks. Patient agrees to continue using plan your day handouts and bring to next session.  Diagnosis: Axis I: MDD.    Axis II: No diagnosis    Adalis Gatti, LCSW 11/11/2013

## 2013-11-11 NOTE — Patient Instructions (Signed)
Discussed orally 

## 2013-11-23 ENCOUNTER — Encounter (HOSPITAL_COMMUNITY): Payer: Self-pay | Admitting: Psychiatry

## 2013-11-23 ENCOUNTER — Ambulatory Visit (INDEPENDENT_AMBULATORY_CARE_PROVIDER_SITE_OTHER): Payer: 59 | Admitting: Psychiatry

## 2013-11-23 VITALS — BP 133/86 | HR 68 | Ht 64.0 in | Wt 255.4 lb

## 2013-11-23 DIAGNOSIS — F332 Major depressive disorder, recurrent severe without psychotic features: Secondary | ICD-10-CM

## 2013-11-23 DIAGNOSIS — F333 Major depressive disorder, recurrent, severe with psychotic symptoms: Secondary | ICD-10-CM

## 2013-11-23 MED ORDER — ARIPIPRAZOLE 5 MG PO TABS: 5.0000 mg | ORAL_TABLET | Freq: Every day | ORAL | Status: DC

## 2013-11-23 MED ORDER — TRAZODONE HCL 50 MG PO TABS: 50.0000 mg | ORAL_TABLET | Freq: Every evening | ORAL | Status: DC | PRN

## 2013-11-23 MED ORDER — BUPROPION HCL ER (XL) 300 MG PO TB24
300.0000 mg | ORAL_TABLET | ORAL | Status: DC
Start: 2013-11-23 — End: 2014-02-16

## 2013-11-23 MED ORDER — PAROXETINE HCL 20 MG PO TABS: 20.0000 mg | ORAL_TABLET | Freq: Every day | ORAL | Status: DC

## 2013-11-23 MED ORDER — HYDROXYZINE HCL 25 MG PO TABS: ORAL_TABLET | ORAL | Status: DC

## 2013-11-23 NOTE — Progress Notes (Signed)
Patient ID: Rebecca [name], female   DOB: January 08, 1956, 58 y.o.   MRN: [id number] Patient ID: Rebecca [name], female   DOB: January 08, 1956, 58 y.o.   MRN: [id number]  Psychiatric Assessment Adult  Patient Identification:  Rebecca [name] Date of Evaluation:  11/23/2013 Chief Complaint: I'm still somewhat depressed History of Chief Complaint:   Chief Complaint  Patient presents with  . Anxiety  . Depression  . Memory Loss  . Follow-up    Anxiety Symptoms include nervous/anxious behavior.     this patient is a 58 year old married white female who lives with her husband in [place]. 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 She has one daughter and 2 grandchildren  The patient was [facility] where she was hospitalized July 31 through August 6 after an intentional overdose on Ambien and oxycodone. The patient has had one prior overdose attempt in 2004. This occurred after she found that her first husband had had an affair with her sister years prior.  After the hospital patient 2004 the patient was followed for time by [facility] in [place]. She did fairly well on Paxil CR. Most recently she has been treated [facility] and Abilify was added. Over the last year however she has lost many important people in her life. 2 of her aunt her best friend another friend and her dog all died within a short period of time. She was going downhill and getting more and more depressed and was dealing with that by drinking 2-3 ounces of liquor per day. She got increasingly despondent and eventually decided to take her life and rose suicide note to her family. Reportedly she  was found the next morning and brought to the ER in the ICU and consequently to the behavioral health hospital.  While there are Wellbutrin and trazodone were added. The patient wasn't sleeping well prior to  hospitalization but she sleeping much better now. Her mood is improved but her energy and motivation are still poor and she's developed tremor in both hands predicted on the left. 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 It's hard for her to type because of the tremor and she can't remember anything. She's no longer suicidal but just doesn't have much energy to do much of anything right now. She's denies auditory visual sensations or paranoia. She is no longer drinking  The patient returns after four-weeks. She is doing better. Her mood is improved and her mind seems sharper. Increase Wellbutrin has helped her focus. She seems to have a bit more energy. She is no longer getting lost. 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 She is sleeping well with the current medications. She's not crying and denies being depressed most of the time Review of Systems  Constitutional: Positive for activity change.  HENT: Negative.   Respiratory: Negative.   Cardiovascular: Negative.   Gastrointestinal: Negative.   Endocrine: Negative.   Genitourinary: Negative.   Musculoskeletal: Positive for arthralgias and joint swelling.  Allergic/Immunologic: Negative.   Neurological: Positive for tremors.  Hematological: Negative.   Psychiatric/Behavioral: Positive for dysphoric mood. The patient is nervous/anxious.    Physical Examnot done  Depressive Symptoms: depressed mood, anhedonia, insomnia, difficulty concentrating, anxiety, loss of energy/fatigue,  (Hypo) Manic Symptoms:   Elevated Mood:  No Irritable Mood:  No Grandiosity:  No Distractibility:  Yes Labiality of Mood:  Yes Delusions:  No Hallucinations:  No Impulsivity:  No Sexually Inappropriate Behavior:  No Financial Extravagance:  No Flight of Ideas:  No  Anxiety Symptoms: Excessive Worry:  Yes Panic Symptoms:   Yes Agoraphobia:  No Obsessive Compulsive: No  Symptoms: None, Specific Phobias:  No Social Anxiety:  Yes  Psychotic Symptoms:  Hallucinations: No None Delusions:  No Paranoia:  No   Ideas of Reference:  No  PTSD Symptoms: Ever had a traumatic exposure:  No Had a traumatic exposure in the last month:  No Re-experiencing: No None Hypervigilance:  No Hyperarousal: No None Avoidance: No None  Traumatic Brain Injury: No   Past Psychiatric History: Diagnosis: Maj. depression   Hospitalizations: In 2004 and again last month   Outpatient Care: At [facility] in the past   Substance Abuse Care: none  Self-Mutilation: none  Suicidal Attempts: Overdose attempts twice   Violent Behaviors:none   Past Medical History:   Past Medical History  Diagnosis Date  . Asthma   . Depression   . Reflux   . Chronic pain began in 2011 after jooint replacements   History of Loss of Consciousness:  Yes Seizure History:  No Cardiac History:  No Allergies:   Allergies  Allergen Reactions  . Sulfa Antibiotics Other (See Comments)    unknown   Current Medications:  Current Outpatient Prescriptions  Medication Sig Dispense Refill  . albuterol (PROVENTIL HFA;VENTOLIN HFA) 108 (90 BASE) MCG/ACT inhaler Inhale 2 puffs into the lungs every 4 (four) hours as needed for wheezing or shortness of breath.      . ARIPiprazole (ABILIFY) 5 MG tablet Take 1 tablet (5 mg total) by mouth daily. For mood control  90 tablet  2  . buPROPion (WELLBUTRIN XL) 300 MG 24 hr tablet Take 1 tablet (300 mg total) by mouth every morning.  90 tablet  2  . fluticasone-salmeterol (ADVAIR HFA) 115-21 MCG/ACT inhaler Inhale 2 puffs into the lungs 2 (two) times daily. For shortness of breath  1 Inhaler  12  . gabapentin (NEURONTIN) 300 MG capsule Take 1 capsule (300 mg total) by mouth 2 (two) times daily. For agitation/pain managment  180 capsule  2  . hydrOXYzine (ATARAX/VISTARIL) 25 MG tablet Take 1 tablet (25 mg)  three times daily as needed: For anxiety  90 tablet  2  . ibuprofen (ADVIL,MOTRIN) 200 MG tablet Take 800 mg by mouth every 8 (eight) hours.      . pantoprazole (PROTONIX) 40 MG tablet Take 1 tablet (40 mg total) by mouth daily. For acid reflux      . PARoxetine (PAXIL) 20 MG tablet Take 1 tablet (20 mg total) by mouth daily.  90 tablet  2  . rOPINIRole (REQUIP) 0.5 MG tablet Take 1 tablet (0.5 mg total) by mouth at bedtime. For restless leg syndrome      . traZODone (DESYREL) 50 MG tablet Take 1 tablet (50 mg total) by mouth at bedtime as needed and may repeat dose one time if needed for sleep.  60 tablet  2   No current facility-administered medications for this visit.    Previous Psychotropic Medications:  Medication Dose   Paxil CR, Ambien                        Substance Abuse History in the last 12 months: Substance Age of 1st Use Last Use Amount  Specific Type  Nicotine      Alcohol    was drinking 2-3 ounces of liquor per day until about 3 weeks ago    Cannabis      Opiates      Cocaine      Methamphetamines      LSD      Ecstasy      Benzodiazepines      Caffeine      Inhalants      Others:                          Medical Consequences of Substance Abuse: none  Legal Consequences of Substance Abuse: none  Family Consequences of Substance Abuse: none  Blackouts:  No DT's:  No Withdrawal Symptoms:  No None  Social History: Current Place of Residence: [place] [address] of Birth: [place] Family Members: Husband daughter 2 grandchildren Marital Status:  Married Children:   Sons:   Daughters: 1 Relationships: Education:  Corporate treasurer Problems/Performance:  Religious Beliefs/Practices: Christian History of Abuse: none Armed forces technical officer; 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 Legal History: none Hobbies/Interests: Animals gardening painting  Family History:   Family History  Problem Relation Age  of Onset  . Deep vein thrombosis Neg Hx   . Pulmonary embolism Neg Hx   . Heart disease Neg Hx   . Heart failure Neg Hx   . Bipolar disorder Sister   . Alcohol abuse Maternal Grandfather     Mental Status Examination/Evaluation: Objective:  Appearance: Casual and Well Groomed    Eye Contact::  Good  Speech:  Clear and Coherent  Volume:  Decreased  Mood: Anxious   Affect: Brighter   Thought Process:  Coherent  Orientation:  Full (Time, Place, and Person)  Thought Content:  Rumination  Suicidal Thoughts:  No  Homicidal Thoughts:  No  Judgement:  Good  Insight:  Good  Psychomotor Activity:  Tremor is almost gone   Akathisia:  No  Handed:  Right  AIMS (if indicated):    Assets:  Communication Skills Desire for Improvement Resilience Social Support Talents/Skills    Laboratory/X-Ray Psychological Evaluation(s)   Reviewed in chart      Assessment:  Axis I: Major Depression, Recurrent severe  AXIS I Major Depression, Recurrent severe  AXIS II Deferred  AXIS III Past Medical History  Diagnosis Date  . Asthma   . Depression   . Reflux   . Chronic pain began in 2011 after jooint replacements     AXIS IV other psychosocial or environmental problems  AXIS V 51-60 moderate symptoms   Treatment Plan/Recommendations:  Plan of Care: Medication management   Laboratory:    Psychotherapy: She is seeing Florencia Reasons here   Medications: She'll continue Wellbutrin XL 300 mg every morning and  Paxil to 20 mg daily. She'll continue all other medications   Routine PRN Medications:  No  Consultations:   Safety Concerns:  She denies current thoughts of self-harm   Other: She'll return in 2 months    Diannia Ruder, MD 10/21/20154:01 PM

## 2013-12-09 ENCOUNTER — Other Ambulatory Visit (HOSPITAL_COMMUNITY): Payer: Self-pay | Admitting: Family Medicine

## 2013-12-09 ENCOUNTER — Ambulatory Visit (HOSPITAL_COMMUNITY)
Admission: RE | Admit: 2013-12-09 | Discharge: 2013-12-09 | Disposition: A | Discharge status: Home / Self Care | Payer: Disability Insurance | Source: Ambulatory Visit | Attending: Family Medicine | Admitting: Family Medicine

## 2013-12-09 ENCOUNTER — Ambulatory Visit (HOSPITAL_COMMUNITY): Payer: Self-pay | Admitting: Psychiatry

## 2013-12-09 DIAGNOSIS — M545 Low back pain, unspecified: Secondary | ICD-10-CM

## 2013-12-09 DIAGNOSIS — G8929 Other chronic pain: Secondary | ICD-10-CM | POA: Insufficient documentation

## 2013-12-09 DIAGNOSIS — M419 Scoliosis, unspecified: Secondary | ICD-10-CM | POA: Insufficient documentation

## 2013-12-12 ENCOUNTER — Ambulatory Visit (INDEPENDENT_AMBULATORY_CARE_PROVIDER_SITE_OTHER): Payer: 59 | Admitting: Psychiatry

## 2013-12-12 DIAGNOSIS — F333 Major depressive disorder, recurrent, severe with psychotic symptoms: Secondary | ICD-10-CM

## 2013-12-12 NOTE — Patient Instructions (Signed)
Discussed orally 

## 2013-12-12 NOTE — Psych (Deleted)
  THERAPIST PROGRESS NOTE  Session Time: Monday 12/12/2013 9:05 AM - 9:50 AM  Participation Level: Active  Behavioral Response: Well GroomedAlertDepressed  Type of Therapy: Individual Therapy  Treatment Goals addressed: Alleviate symptoms of depression (crying spells, loss of interest, depressed mood) in return to previous level of functioning  Implement coping skills reducing anxiety and excessive worry along with panic attack  Interventions: CBT and Supportive  Summary: Rebecca [name] is a 58 y.o. female who is referred for follow up treatment upon discharge from [facility] where she was treated fr om 09/04/2013 -09/08/2013 for depression and a suicide attempt. Per patient's report, this was triggered by 6 major deaths in the past 6 months. These deaths include the loss of a best friend, 2 aunts, another friend, and her 57 year old dog, a [name]. Patient reports writing a 3-page suicide note and using liquor along with taking a half bottle of Ambien and a half bottle of her husband's oxycodone. She denies any suicidal ideations since discharge but reports feeling depressed and down like a cloud is over her head. She has crying spells daily and reports no interest in previously enjoyed activities/ She is experiencing poor concentration along with memory difficulty and states forgetting things like when she took her medication or what she ate.She is experiencing anxiety and worry about returning to work. 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 She also expresses concern regarding her speech stating difficulty and sometimes feeling like she is dyslexic. She also reports stress related to marriage as husband suffers from chronic pain due to severe peripheral  neuropathy and is often very irritable. 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 She reports stress related to financial concerns as she recently was evaluated for disability and is worried about possible outcome as this affects her financial future. She also reports increased anxiety last week as she saw her PCP for the first time since patient's suicide attempt.    Suicidal/Homicidal: No  Therapist Response: Therapist works with patient to provide psychoeducation regarding depression, identify thought patterns and effects on mood and behavior, develop list of coping statements and discuss coping technique (gratitude list), identify ways to limit worry regarding financial issues, and encourage patient to resume self-care efforts along with increasing involvement in activity.  Plan: Return again in 4 weeks. Patient agrees to continue using plan your day handouts and bring to next session. Patient also agrees to schedule earlier appointment with Dr. Tenny Crawoss  Diagnosis:Axis I: MDD.  Axis II: No diagnosis

## 2013-12-13 NOTE — Progress Notes (Signed)
   THERAPIST PROGRESS NOTE  Session Time: Monday 12/12/2013 9:05 AM - 9:50 AM  Participation Level: Active  Behavioral Response: Well GroomedAlertDepressed  Type of Therapy: Individual Therapy  Treatment Goals addressed:  Alleviate symptoms of depression (crying spells, loss of interest, depressed mood) in return to previous level of functioning  Implement coping skills reducing anxiety and excessive worry along with panic attack  Interventions: CBT and Supportive  Summary: Rebecca [name] is a 58 y.o. female who is referred for follow up treatment upon discharge [facility] where she was treated fr om 09/04/2013 -09/08/2013 for depression and a suicide attempt. Per patient's report, this was triggered by 6 major deaths in the past 6 months. These deaths include the loss of a best friend, 2 aunts, another friend, and her 57 year old dog, a [name]. Patient reports writing a 3-page suicide note and using liquor along with taking a half bottle of Ambien and a half bottle of her husband's oxycodone. She denies any suicidal ideations since discharge but reports feeling depressed and down like a cloud is over her head. She has crying spells daily and reports no interest in previously enjoyed activities/ She is experiencing poor concentration along with memory difficulty and states forgetting things like when she took her medication or what she ate.She is experiencing anxiety and worry about returning to work. 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 She also expresses concern regarding her speech stating difficulty and sometimes feeling like she is dyslexic. She also reports stress related to marriage as husband suffers from chronic pain due to severe peripheral  neuropathy and is often very irritable. 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 She reports stress related to financial concerns as she recently was evaluated for disability and is worried about possible outcome as this affects her financial future   Suicidal/Homicidal: No  Therapist Response: Therapist works with patient to provide psychoeducation regarding depression, identify thought patterns and effects on mood and behavior, developing a list of coping statements, discussed coping techniques, identify ways to limit worry and encourage patient to resume self-care efforts along with increasing nvolvement in activity   Plan: Return again in 2 weeks. Patient agrees to continue using plan your day handouts and bring to next session. She also agrees to schedule an earlier appointment with psychiatrist Dr. Tenny Crawoss.  Diagnosis: Axis I: MDD.    Axis II: No diagnosis    Josel Keo, LCSW 12/13/2013

## 2013-12-16 ENCOUNTER — Ambulatory Visit (INDEPENDENT_AMBULATORY_CARE_PROVIDER_SITE_OTHER): Payer: 59 | Admitting: Psychiatry

## 2013-12-16 ENCOUNTER — Encounter (HOSPITAL_COMMUNITY): Payer: Self-pay | Admitting: Psychiatry

## 2013-12-16 ENCOUNTER — Telehealth (HOSPITAL_COMMUNITY): Payer: Self-pay | Admitting: *Deleted

## 2013-12-16 VITALS — BP 136/77 | HR 74 | Ht 64.0 in | Wt 248.6 lb

## 2013-12-16 DIAGNOSIS — F333 Major depressive disorder, recurrent, severe with psychotic symptoms: Secondary | ICD-10-CM

## 2013-12-16 DIAGNOSIS — F332 Major depressive disorder, recurrent severe without psychotic features: Secondary | ICD-10-CM

## 2013-12-16 MED ORDER — PAROXETINE HCL ER 25 MG PO TB24: ORAL_TABLET | ORAL | Status: DC

## 2013-12-16 NOTE — Progress Notes (Signed)
Patient ID: Rebecca [name], female   DOB: 02-22-1955, 58 y.o.   MRN: [id number] Patient ID: Rebecca [name], female   DOB: 02-22-1955, 58 y.o.   MRN: [id number] Patient ID: Rebecca [name], female   DOB: 02-22-1955, 58 y.o.   MRN: [id number]  Psychiatric Assessment Adult  Patient Identification:  Rebecca [name] Date of Evaluation:  12/16/2013 Chief Complaint: I'm still somewhat depressed History of Chief Complaint:   Chief Complaint  Patient presents with  . Depression  . Anxiety  . Follow-up    Anxiety Symptoms include nervous/anxious behavior.     this patient is a 58 year old married white female who lives with her husband in [place]. 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 She has one daughter and 2 grandchildren  The patient was [facility] where she was hospitalized July 31 through August 6 after an intentional overdose on Ambien and oxycodone. The patient has had one prior overdose attempt in 2004. This occurred after she found that her first husband had had an affair with her sister years prior.  After the hospital patient 2004 the patient was followed for time by [facility] [place]. She did fairly well on Paxil CR. Most recently she has been treated [facility] and Abilify was added. Over the last year however she has lost many important people in her life. 2 of her aunt her best friend another friend and her dog all died within a short period of time. She was going downhill and getting more and more depressed and was dealing with that by drinking 2-3 ounces of liquor per day. She got increasingly despondent and eventually decided to take her life and rose suicide note to her family. Reportedly she  was found the next morning and brought to the ER in the ICU and consequently to the behavioral health hospital.  While there are Wellbutrin and trazodone  were added. The patient wasn't sleeping well prior to hospitalization but she sleeping much better now. Her mood is improved but her energy and motivation are still poor and she's developed tremor in both hands predicted on the left. 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 It's hard for her to type because of the tremor and she can't remember anything. She's no longer suicidal but just doesn't have much energy to do much of anything right now. She's denies auditory visual sensations or paranoia. She is no longer drinking  The patient returns today as a work in. Her therapist wanted her to come in because she seemed more depressed. She's having trouble getting to sleep but when she does get to sleep she sometimes sleeps up to 10 hours. She's very worried about her disability another financial issues and it's hard for her to get her mind shut down.she denies being suicidal and she is not drinking.she thinks she did better on the higher dose of Paxil Review of Systems  Constitutional: Positive for activity change.  HENT: Negative.   Respiratory: Negative.   Cardiovascular: Negative.   Gastrointestinal: Negative.   Endocrine: Negative.   Genitourinary: Negative.   Musculoskeletal: Positive for joint swelling and arthralgias.  Allergic/Immunologic: Negative.   Neurological: Positive for tremors.  Hematological: Negative.   Psychiatric/Behavioral: Positive for dysphoric mood. The patient is nervous/anxious.    Physical Examnot  done  Depressive Symptoms: depressed mood, anhedonia, insomnia, difficulty concentrating, anxiety, loss of energy/fatigue,  (Hypo) Manic Symptoms:   Elevated Mood:  No Irritable Mood:  No Grandiosity:  No Distractibility:  Yes Labiality of Mood:  Yes Delusions:  No Hallucinations:  No Impulsivity:  No Sexually Inappropriate Behavior:  No Financial  Extravagance:  No Flight of Ideas:  No  Anxiety Symptoms: Excessive Worry:  Yes Panic Symptoms:  Yes Agoraphobia:  No Obsessive Compulsive: No  Symptoms: None, Specific Phobias:  No Social Anxiety:  Yes  Psychotic Symptoms:  Hallucinations: No None Delusions:  No Paranoia:  No   Ideas of Reference:  No  PTSD Symptoms: Ever had a traumatic exposure:  No Had a traumatic exposure in the last month:  No Re-experiencing: No None Hypervigilance:  No Hyperarousal: No None Avoidance: No None  Traumatic Brain Injury: No   Past Psychiatric History: Diagnosis: Maj. depression   Hospitalizations: In 2004 and again last month   Outpatient Care: At [facility] in the past   Substance Abuse Care: none  Self-Mutilation: none  Suicidal Attempts: Overdose attempts twice   Violent Behaviors:none   Past Medical History:   Past Medical History  Diagnosis Date  . Asthma   . Depression   . Reflux   . Chronic pain began in 2011 after jooint replacements   History of Loss of Consciousness:  Yes Seizure History:  No Cardiac History:  No Allergies:   Allergies  Allergen Reactions  . Sulfa Antibiotics Other (See Comments)    unknown   Current Medications:  Current Outpatient Prescriptions  Medication Sig Dispense Refill  . albuterol (PROVENTIL HFA;VENTOLIN HFA) 108 (90 BASE) MCG/ACT inhaler Inhale 2 puffs into the lungs every 4 (four) hours as needed for wheezing or shortness of breath.    . ARIPiprazole (ABILIFY) 5 MG tablet Take 1 tablet (5 mg total) by mouth daily. For mood control 90 tablet 2  . buPROPion (WELLBUTRIN XL) 300 MG 24 hr tablet Take 1 tablet (300 mg total) by mouth every morning. 90 tablet 2  . fluticasone-salmeterol (ADVAIR HFA) 115-21 MCG/ACT inhaler Inhale 2 puffs into the lungs 2 (two) times daily. For shortness of breath 1 Inhaler 12  . gabapentin (NEURONTIN) 300 MG capsule Take 1 capsule (300 mg total) by mouth 2 (two) times daily. For agitation/pain  managment 180 capsule 2  . hydrOXYzine (ATARAX/VISTARIL) 25 MG tablet Take 1 tablet (25 mg) three times daily as needed: For anxiety 90 tablet 2  . ibuprofen (ADVIL,MOTRIN) 200 MG tablet Take 800 mg by mouth every 8 (eight) hours.    . pantoprazole (PROTONIX) 40 MG tablet Take 1 tablet (40 mg total) by mouth daily. For acid reflux    . rOPINIRole (REQUIP) 0.5 MG tablet Take 1 tablet (0.5 mg total) by mouth at bedtime. For restless leg syndrome    . traZODone (DESYREL) 50 MG tablet Take 1 tablet (50 mg total) by mouth at bedtime as needed and may repeat dose one time if needed for sleep. 60 tablet 2  . PARoxetine (PAXIL CR) 25 MG 24 hr tablet Take two in the am 60 tablet 2   No current facility-administered medications for this visit.    Previous Psychotropic Medications:  Medication Dose   Paxil CR, Ambien                        Substance Abuse History in the last 12 months: Substance Age of 1st Use Last Use Amount  Specific Type  Nicotine      Alcohol    was drinking 2-3 ounces of liquor per day until about 3 weeks ago    Cannabis      Opiates      Cocaine      Methamphetamines      LSD      Ecstasy      Benzodiazepines      Caffeine      Inhalants      Others:                          Medical Consequences of Substance Abuse: none  Legal Consequences of Substance Abuse: none  Family Consequences of Substance Abuse: none  Blackouts:  No DT's:  No Withdrawal Symptoms:  No None  Social History: Current Place [place] Residence: [place] [address] [place] Place of Birth: [place] Family Members: Husband daughter 2 grandchildren Marital Status:  Married Children:   Sons:   Daughters: 1 Relationships: Education:  [employer] Educational Problems/Performance:  Religious Beliefs/Practices: Christian History of Abuse: none Armed forces technical officerccupational Experiences; 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 Legal History: none Hobbies/Interests: Animals gardening  painting  Family History:   Family History  Problem Relation Age of Onset  . Deep vein thrombosis Neg Hx   . Pulmonary embolism Neg Hx   . Heart disease Neg Hx   . Heart failure Neg Hx   . Bipolar disorder Sister   . Alcohol abuse Maternal Grandfather     Mental Status Examination/Evaluation: Objective:  Appearance: Casual and Well Groomed    Eye Contact::  Good  Speech:  Clear and Coherent  Volume:  Decreased  Mood: Anxious   Affect: somewhat constricted  Thought Process:  Coherent  Orientation:  Full (Time, Place, and Person)  Thought Content:  Rumination  Suicidal Thoughts:  No  Homicidal Thoughts:  No  Judgement:  Good  Insight:  Good  Psychomotor Activity:  Tremor is almost gone   Akathisia:  No  Handed:  Right  AIMS (if indicated):    Assets:  Communication Skills Desire for Improvement Resilience Social Support Talents/Skills    Laboratory/X-Ray Psychological Evaluation(s)   Reviewed in chart      Assessment:  Axis I: Major Depression, Recurrent severe  AXIS I Major Depression, Recurrent severe  AXIS II Deferred  AXIS III Past Medical History  Diagnosis Date  . Asthma   . Depression   . Reflux   . Chronic pain began in 2011 after jooint replacements     AXIS IV other psychosocial or environmental problems  AXIS V 51-60 moderate symptoms   Treatment Plan/Recommendations:  Plan of Care: Medication management   Laboratory:    Psychotherapy: She is seeing Florencia ReasonsPeggy Bynum here   Medications: She'll continue Wellbutrin XL 300 mg every morning and aims Paxil to Paxil CR 25 mg every morning. She'll continue all other medications   Routine PRN Medications:  No  Consultations:   Safety Concerns:  She denies current thoughts of self-harm   Other: She'll return in 2 months    Diannia RuderOSS, DEBORAH, MD 11/13/20154:34 PM

## 2013-12-23 ENCOUNTER — Ambulatory Visit (HOSPITAL_COMMUNITY): Payer: Self-pay | Admitting: Psychiatry

## 2014-01-06 ENCOUNTER — Ambulatory Visit (HOSPITAL_COMMUNITY): Payer: Self-pay | Admitting: Psychiatry

## 2014-01-18 ENCOUNTER — Ambulatory Visit (INDEPENDENT_AMBULATORY_CARE_PROVIDER_SITE_OTHER): Payer: 59 | Admitting: Psychiatry

## 2014-01-18 DIAGNOSIS — F333 Major depressive disorder, recurrent, severe with psychotic symptoms: Secondary | ICD-10-CM

## 2014-01-18 NOTE — Patient Instructions (Signed)
Discussed orally 

## 2014-01-18 NOTE — Progress Notes (Signed)
     THERAPIST PROGRESS NOTE  Session Time: Thursday 01/18/2014 3:05 PM - 3:30 PM  Participation Level: Active  Behavioral Response: Well GroomedAlert./Euthymic  Type of Therapy: Individual Therapy  Treatment Goals addressed:  Alleviate symptoms of depression (crying spells, loss of interest, depressed mood) in return to previous level of functioning  Implement coping skills reducing anxiety and excessive worry along with panic attack  Interventions: CBT and Supportive  Summary: Rebecca [name] is a 58 y.o. female who is referred for follow up treatment upon discharge [facility] where she was treated fr om 09/04/2013 -09/08/2013 for depression and a suicide attempt. Per patient's report, this was triggered by 6 major deaths in the past 6 months. These deaths include the loss of a best friend, 2 aunts, another friend, and her 57 year old dog, a [name]. Patient reports writing a 3-page suicide note and using liquor along with taking a half bottle of Ambien and a half bottle of her husband's oxycodone. She denies any suicidal ideations since discharge but reports feeling depressed and down like a cloud is over her head. She has crying spells daily and reports no interest in previously enjoyed activities/ She is experiencing poor concentration along with memory difficulty and states forgetting things like when she took her medication or what she ate.She is experiencing anxiety and worry about returning to work. 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 She also expresses concern regarding her speech stating difficulty and sometimes feeling like she is dyslexic. She also reports stress related to marriage as husband suffers from chronic pain due to severe peripheral  neuropathy and is often very irritable. 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 Tenny Crawoss. She also expresses relief she was approved for disability income last week. Patient states feeling almost normal. She has increased her involvement in activity socializing with family and friends, engaging in projects at her home, decorating and preparing for Christmas. She also has been more involved in caring for her horses and states not just sitting and watching TV. She has reconnected with friends as well as her former co-workers. She reports improved sleep pattern.  Suicidal/Homicidal: No  Therapist Response: Therapist works with patient to praise patient's increased activity and social involvement, review coping techniques, and identify ways to maintain consistent efforts regarding self-care and use of coping tools   Plan: Return again in 4 weeks  Diagnosis: Axis I: MDD.    Axis II: No diagnosis    Island Dohmen, LCSW 01/18/2014

## 2014-01-23 ENCOUNTER — Ambulatory Visit (HOSPITAL_COMMUNITY): Payer: Self-pay | Admitting: Psychiatry

## 2014-02-15 ENCOUNTER — Ambulatory Visit (INDEPENDENT_AMBULATORY_CARE_PROVIDER_SITE_OTHER): Payer: 59 | Admitting: Psychiatry

## 2014-02-15 DIAGNOSIS — F333 Major depressive disorder, recurrent, severe with psychotic symptoms: Secondary | ICD-10-CM

## 2014-02-15 NOTE — Progress Notes (Addendum)
      THERAPIST PROGRESS NOTE  Session Time: Wednesday 02/15/2014 3:10 PM - 3:55 PM  Participation Level: Active  Behavioral Response: Well GroomedAlert./Anxious  Type of Therapy: Individual Therapy  Treatment Goals addressed:  Alleviate symptoms of depression (crying spells, loss of interest, depressed mood) in return to previous level of functioning  Implement coping skills reducing anxiety and excessive worry along with panic attack  Interventions: CBT and Supportive  Summary: Rebecca [name] is a 59 y.o. female who is referred for follow up treatment upon discharge from hospitalization [facility] where she was treated fr om 09/04/2013 -09/08/2013 for depression and a suicide attempt. Per patient's report, this was triggered by 6 major deaths in the past 6 months. These deaths include the loss of a best friend, 2 aunts, another friend, and her 105 year old dog, a [name]. Patient reports writing a 3-page suicide note and using liquor along with taking a half bottle of Ambien and a half bottle of her husband's oxycodone. She denies any suicidal ideations since discharge but reports feeling depressed and down like a cloud is over her head. She has crying spells daily and reports no interest in previously enjoyed activities/ She is experiencing poor concentration along with memory difficulty and states forgetting things like when she took her medication or what she ate.She is experiencing anxiety and worry about returning to work. 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 She also expresses concern regarding her speech stating difficulty and sometimes feeling like she is dyslexic. She also reports stress related to marriage as husband suffers from chronic pain due to severe peripheral  neuropathy and is often very irritable. 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 Per patient's report, he suffers from chronic pain and has a pattern of being irritable and angry when pain worsens. She reports he recently threatened to shoot himself and took a gun with him to the barn but later returned and said nothing about the incident. A few days later, he became angry and patient felt threatened. She called the police who took husband to hospital ER. She reports threatening to leave husband due to his behavior. Since then, husband acknowledged needing help and had an assessment today with a ment al health provider. Patient reports not feeling depressed but being concerned about  how to manage her emotions in coping with husband and her marriage.   Suicidal/Homicidal: No  Therapist Response: Therapist works with patient to discuss safety issues and steps to ensure safety including calling 911, contacting  magistrate, securing weapons and bullets,  identify ways to avoid escalating conflict and ways to disengage, identify ways to set/maintain boundaries, identify ways to communicate concerns to husband,  identify coping and relaxation techniques and ways to maintain involvement in positive activities and positive relationships, encourage patient to journal.  Plan: Return again in 4 weeks  Diagnosis: Axis I: MDD.    Axis II: No diagnosis    Cordaryl Decelles, LCSW 02/15/2014

## 2014-02-15 NOTE — Patient Instructions (Signed)
Discussed orally 

## 2014-02-16 ENCOUNTER — Ambulatory Visit (INDEPENDENT_AMBULATORY_CARE_PROVIDER_SITE_OTHER): Payer: 59 | Admitting: Psychiatry

## 2014-02-16 ENCOUNTER — Other Ambulatory Visit (HOSPITAL_COMMUNITY): Payer: Self-pay | Admitting: Respiratory Therapy

## 2014-02-16 ENCOUNTER — Encounter (HOSPITAL_COMMUNITY): Payer: Self-pay | Admitting: Psychiatry

## 2014-02-16 DIAGNOSIS — F333 Major depressive disorder, recurrent, severe with psychotic symptoms: Secondary | ICD-10-CM

## 2014-02-16 DIAGNOSIS — F332 Major depressive disorder, recurrent severe without psychotic features: Secondary | ICD-10-CM

## 2014-02-16 DIAGNOSIS — R0683 Snoring: Secondary | ICD-10-CM

## 2014-02-16 MED ORDER — PAROXETINE HCL ER 25 MG PO TB24: 25.0000 mg | ORAL_TABLET | Freq: Every day | ORAL | Status: DC

## 2014-02-16 MED ORDER — GABAPENTIN 300 MG PO CAPS: 300.0000 mg | ORAL_CAPSULE | Freq: Two times a day (BID) | ORAL | Status: DC

## 2014-02-16 MED ORDER — BUPROPION HCL ER (XL) 300 MG PO TB24: 300.0000 mg | ORAL_TABLET | ORAL | Status: DC

## 2014-02-16 MED ORDER — TRAZODONE HCL 50 MG PO TABS: 50.0000 mg | ORAL_TABLET | Freq: Every evening | ORAL | Status: DC | PRN

## 2014-02-16 MED ORDER — HYDROXYZINE HCL 25 MG PO TABS: ORAL_TABLET | ORAL | Status: DC

## 2014-02-16 MED ORDER — ARIPIPRAZOLE 5 MG PO TABS: 5.0000 mg | ORAL_TABLET | Freq: Every day | ORAL | Status: DC
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
## 2014-03-08 ENCOUNTER — Ambulatory Visit (INDEPENDENT_AMBULATORY_CARE_PROVIDER_SITE_OTHER): Payer: 59 | Admitting: Psychiatry

## 2014-03-08 DIAGNOSIS — F333 Major depressive disorder, recurrent, severe with psychotic symptoms: Secondary | ICD-10-CM

## 2014-03-08 NOTE — Progress Notes (Signed)
       THERAPIST PROGRESS NOTE  Session Time: Wednesday 03/08/2014 3:10 PM - 3:55 PM  Participation Level: Active  Behavioral Response: Well GroomedAlert./sad/tearful  Type of Therapy: Individual Therapy  Treatment Goals addressed:  Alleviate symptoms of depression (crying spells, loss of interest, depressed mood) in return to previous level of functioning  Implement coping skills reducing anxiety and excessive worry along with panic attack  Interventions: CBT and Supportive  Summary: Rebecca [name] is a 59 y.o. female who is referred for follow up treatment upon [facility] she was treated fr om 09/04/2013 -09/08/2013 for depression and a suicide attempt. Per patient's report, this was triggered by 6 major deaths in the past 6 months. These deaths include the loss of a best friend, 2 aunts, another friend, and her 571 year old dog, a [name]. Patient reports writing a 3-page suicide note and using liquor along with taking a half bottle of Ambien and a half bottle of her husband's oxycodone. She denies any suicidal ideations since discharge but reports feeling depressed and down like a cloud is over her head. She has crying spells daily and reports no interest in previously enjoyed activities/ She is experiencing poor concentration along with memory difficulty and states forgetting things like when she took her medication or what she ate.She is experiencing anxiety and worry about returning to work. 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 She also expresses concern regarding her speech stating difficulty and sometimes feeling like she is dyslexic. She also reports stress related to marriage as husband suffers from chronic pain due to severe  peripheral neuropathy and is often very irritable. 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 The Memorial service was this past Monday and the burial will be held this Friday. Patient reports still being in shock. She is thankful she had reconciled and forgiven her sister years ago. Patient reports sadness and pain regarding the effects of her sister's death on their parents. She reports strong support from her, husband, daughter, and friends. She reports less stress related to husband as his behavior has improved significantly. Patient has maintained self-care efforts and involvement in activity.    Suicidal/Homicidal: No  Therapist Response: Therapist works with patient to process feelings, identify ways to maintain self-care and use support system. Plan: Return again in 4 weeks  Diagnosis: Axis I: MDD.    Axis II: No diagnosis    Kiyon Fidalgo, LCSW 03/08/2014

## 2014-03-08 NOTE — Patient Instructions (Signed)
Discussed orally 

## 2014-03-22 ENCOUNTER — Ambulatory Visit: Payer: 59 | Attending: Family Medicine | Admitting: Sleep Medicine

## 2014-03-22 DIAGNOSIS — R0683 Snoring: Secondary | ICD-10-CM | POA: Diagnosis not present

## 2014-03-22 DIAGNOSIS — R0681 Apnea, not elsewhere classified: Secondary | ICD-10-CM | POA: Insufficient documentation

## 2014-03-22 DIAGNOSIS — E669 Obesity, unspecified: Secondary | ICD-10-CM | POA: Insufficient documentation

## 2014-03-22 DIAGNOSIS — Z6841 Body Mass Index (BMI) 40.0 and over, adult: Secondary | ICD-10-CM | POA: Diagnosis not present

## 2014-04-01 NOTE — Sleep Study (Signed)
  [facility] [place] Tippy A. Gerilyn Pilgrimoonquah, MD     [email or web address]        NOCTURNAL POLYSOMNOGRAM    LOCATION: SLEEP LAB FACILITY: [facility]   PHYSICIAN: Amelia Macken A. Gerilyn Pilgrimoonquah, M.D.   DATE OF STUDY: 03/22/2014.   REFERRING PHYSICIAN: Cammie Fulp.   INDICATIONS: The patient is a 59 year old who presents with poor sleep, snoring and witnessed apneas and obesity.  MEDICATIONS:  Prior to Admission medications   Medication Sig Start Date End Date Taking? Authorizing Provider  albuterol (PROVENTIL HFA;VENTOLIN HFA) 108 (90 BASE) MCG/ACT inhaler Inhale 2 puffs into the lungs every 4 (four) hours as needed for wheezing or shortness of breath. 09/08/13   Sanjuana KavaAgnes I Nwoko, NP  ARIPiprazole (ABILIFY) 5 MG tablet Take 1 tablet (5 mg total) by mouth daily. For mood control 02/16/14   Diannia Rudereborah Ross, MD  buPROPion (WELLBUTRIN XL) 300 MG 24 hr tablet Take 1 tablet (300 mg total) by mouth every morning. 02/16/14 02/16/15  Diannia Rudereborah Ross, MD  fluticasone-salmeterol (ADVAIR HFA) 8596506999115-21 MCG/ACT inhaler Inhale 2 puffs into the lungs 2 (two) times daily. For shortness of breath 09/08/13   Sanjuana KavaAgnes I Nwoko, NP  gabapentin (NEURONTIN) 300 MG capsule Take 1 capsule (300 mg total) by mouth 2 (two) times daily. For agitation/pain managment 02/16/14   Diannia Rudereborah Ross, MD  hydrOXYzine (ATARAX/VISTARIL) 25 MG tablet Take 1 tablet (25 mg) three times daily as needed: For anxiety 02/16/14   Diannia Rudereborah Ross, MD  ibuprofen (ADVIL,MOTRIN) 200 MG tablet Take 800 mg by mouth every 8 (eight) hours. 09/08/13   Sanjuana KavaAgnes I Nwoko, NP  pantoprazole (PROTONIX) 40 MG tablet Take 1 tablet (40 mg total) by mouth daily. For acid reflux 09/08/13   Sanjuana KavaAgnes I Nwoko, NP  PARoxetine (PAXIL-CR) 25 MG 24 hr tablet Take 1 tablet (25 mg total) by mouth daily. 02/16/14   Diannia Rudereborah Ross, MD  rOPINIRole (REQUIP) 0.5 MG tablet Take 1 tablet (0.5 mg total) by mouth at bedtime. For restless leg syndrome 09/08/13   Sanjuana KavaAgnes I Nwoko, NP  traZODone (DESYREL) 50 MG tablet Take 1 tablet (50 mg  total) by mouth at bedtime as needed and may repeat dose one time if needed for sleep. 02/16/14   Diannia Rudereborah Ross, MD      EPWORTH SLEEPINESS SCALE: 6.   BMI: 44.   ARCHITECTURAL SUMMARY: Total recording time was 381 minutes. Sleep efficiency 67 %. Sleep latency 41 minutes. REM latency 193 minutes. Stage NI 24 %, N2 71 % and N3 2 % and REM sleep 4 %. The patient's sleep quality was quite fragmented with frequent arousals and poor consolidation.   RESPIRATORY DATA:  Baseline oxygen saturation is 98 %. The lowest saturation is 89 %. The diagnostic AHI is 1. The RDI is 1. The REM AHI is 0.  LIMB MOVEMENT SUMMARY: PLM index 1.   ELECTROCARDIOGRAM SUMMARY: Average heart rate is 71 with no significant dysrhythmias observed.   IMPRESSION:  1. Abnormal sleep architecture with fragmented sleep/poor consolidated sleep, increased stage II non-REM sleep and reduced slow-wave sleep. There is no underlying polysomnographic abnormality to explain the fragmented sleep.  Thanks for this referral.  Josy Peaden A. Gerilyn Pilgrimoonquah, M.D. Diplomat, Biomedical engineerAmerican Board of Sleep Medicine.

## 2014-04-05 ENCOUNTER — Ambulatory Visit (HOSPITAL_COMMUNITY): Payer: Self-pay | Admitting: Psychiatry

## 2014-04-10 ENCOUNTER — Ambulatory Visit (INDEPENDENT_AMBULATORY_CARE_PROVIDER_SITE_OTHER): Payer: 59 | Admitting: Psychiatry

## 2014-04-10 DIAGNOSIS — F333 Major depressive disorder, recurrent, severe with psychotic symptoms: Secondary | ICD-10-CM

## 2014-04-10 NOTE — Patient Instructions (Signed)
Discussed orally 
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
## 2014-05-05 ENCOUNTER — Ambulatory Visit (INDEPENDENT_AMBULATORY_CARE_PROVIDER_SITE_OTHER): Payer: 59 | Admitting: Psychiatry

## 2014-05-05 ENCOUNTER — Encounter (HOSPITAL_COMMUNITY): Payer: Self-pay | Admitting: Psychiatry

## 2014-05-05 VITALS — BP 110/82 | Ht 64.0 in | Wt 231.0 lb

## 2014-05-05 DIAGNOSIS — F333 Major depressive disorder, recurrent, severe with psychotic symptoms: Secondary | ICD-10-CM | POA: Diagnosis not present

## 2014-05-05 MED ORDER — HYDROXYZINE HCL 25 MG PO TABS: ORAL_TABLET | ORAL | Status: DC

## 2014-05-05 MED ORDER — BUPROPION HCL ER (XL) 300 MG PO TB24: 300.0000 mg | ORAL_TABLET | ORAL | Status: DC

## 2014-05-05 MED ORDER — ARIPIPRAZOLE 5 MG PO TABS: 5.0000 mg | ORAL_TABLET | Freq: Every day | ORAL | Status: DC

## 2014-05-05 MED ORDER — TRAZODONE HCL 50 MG PO TABS: 50.0000 mg | ORAL_TABLET | Freq: Every evening | ORAL | Status: DC | PRN

## 2014-05-05 MED ORDER — PAROXETINE HCL ER 25 MG PO TB24: 25.0000 mg | ORAL_TABLET | Freq: Every day | ORAL | Status: DC

## 2014-05-05 MED ORDER — GABAPENTIN 300 MG PO CAPS: 300.0000 mg | ORAL_CAPSULE | Freq: Two times a day (BID) | ORAL | Status: DC

## 2014-05-05 NOTE — Progress Notes (Signed)
Patient ID: Rebecca [name], female   DOB: 1955-11-29, 59 y.o.   MRN: [id number] Patient ID: Rebecca [name], female   DOB: Jun 27, 1955, 59 y.o.   MRN: [id number] Patient ID: Rebecca [name], female   DOB: [birth year], 59 y.o.   MRN: [id number] Patient ID: Rebecca [name], female   DOB: 1956/02/03, 59 y.o.   MRN: [id number] Patient ID: Rebecca [name], female   DOB: 1956/01/21, 59 y.o.   MRN: [id number]  Psychiatric Assessment Adult  Patient Identification:  Rebecca [name] Date of Evaluation:  05/05/2014 Chief Complaint: I'm doing better History of Chief Complaint:   Chief Complaint  Patient presents with  . Depression  . Anxiety  . Follow-up    Anxiety Symptoms include nervous/anxious behavior.     this patient is a 59 year old separated white female who lives alone in [place]. 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 She has one daughter and 2 grandchildren  The patient was referred by the [facility] where she was hospitalized July 31 through August 6 after an intentional overdose on Ambien and oxycodone. The patient has had one prior overdose attempt in 2004. This occurred after she found that her first husband had had an affair with her sister years prior.  After the hospital patient 2004 the patient was followed for time by [facility] in [place]. She did fairly well on Paxil CR. Most recently she has been treated [facility] primary care and Abilify was added. Over the last year however she has lost many important people in her life. 2 of her aunt her best friend another friend and her dog all died within a short period of time. She was going downhill and getting more and more depressed and was dealing with that by drinking 2-3 ounces of liquor per day. She got increasingly despondent and eventually decided to take her life and rose suicide note to her family. Reportedly she  was found the  next morning and brought to the ER in the ICU and consequently to the behavioral health hospital.  While there are Wellbutrin and trazodone were added. The patient wasn't sleeping well prior to hospitalization but she sleeping much better now. Her mood is improved but her energy and motivation are still poor and she's developed tremor in both hands predicted on the left. 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 It's hard for her to type because of the tremor and she can't remember anything. She's no longer suicidal but just doesn't have much energy to do much of anything right now. She's denies auditory visual sensations or paranoia. She is no longer drinking  The patient returns today after 2 months. She has had some major changes in her life. Her sister died of a drug overdose several weeks ago. Apparently her sister has a long history of addiction problems. Also the patient separated from her husband and has moved into an apartment in [place]. She states this was an amicable separation and they just weren't in love with each other anymore. She had been with him for 8 years. She is really enjoying her apartment and her freedom. Her mood is been very stable and she's had no urges to drink. She is on disability now and is not sure how she will spend her time but is enjoying visiting with  friends and spending time with her daughter Review of Systems  Constitutional: Positive for activity change.  HENT: Negative.   Respiratory: Negative.   Cardiovascular: Negative.   Gastrointestinal: Negative.   Endocrine: Negative.   Genitourinary: Negative.   Musculoskeletal: Positive for joint swelling and arthralgias.  Allergic/Immunologic: Negative.   Neurological: Positive for tremors.  Hematological: Negative.   Psychiatric/Behavioral: Positive for dysphoric mood. The patient is nervous/anxious.     Physical Examnot done  Depressive Symptoms: depressed mood, anhedonia, insomnia, difficulty concentrating, anxiety, loss of energy/fatigue,  (Hypo) Manic Symptoms:   Elevated Mood:  No Irritable Mood:  No Grandiosity:  No Distractibility:  Yes Labiality of Mood:  Yes Delusions:  No Hallucinations:  No Impulsivity:  No Sexually Inappropriate Behavior:  No Financial Extravagance:  No Flight of Ideas:  No  Anxiety Symptoms: Excessive Worry:  Yes Panic Symptoms:  Yes Agoraphobia:  No Obsessive Compulsive: No  Symptoms: None, Specific Phobias:  No Social Anxiety:  Yes  Psychotic Symptoms:  Hallucinations: No None Delusions:  No Paranoia:  No   Ideas of Reference:  No  PTSD Symptoms: Ever had a traumatic exposure:  No Had a traumatic exposure in the last month:  No Re-experiencing: No None Hypervigilance:  No Hyperarousal: No None Avoidance: No None  Traumatic Brain Injury: No   Past Psychiatric History: Diagnosis: Maj. depression   Hospitalizations: In 2004 and again last month   Outpatient Care: At [facility] in the past   Substance Abuse Care: none  Self-Mutilation: none  Suicidal Attempts: Overdose attempts twice   Violent Behaviors:none   Past Medical History:   Past Medical History  Diagnosis Date  . Asthma   . Depression   . Reflux   . Chronic pain began in 2011 after jooint replacements   History of Loss of Consciousness:  Yes Seizure History:  No Cardiac History:  No Allergies:   Allergies  Allergen Reactions  . Sulfa Antibiotics Other (See Comments)    unknown   Current Medications:  Current Outpatient Prescriptions  Medication Sig Dispense Refill  . albuterol (PROVENTIL HFA;VENTOLIN HFA) 108 (90 BASE) MCG/ACT inhaler Inhale 2 puffs into the lungs every 4 (four) hours as needed for wheezing or shortness of breath.    . ARIPiprazole (ABILIFY) 5 MG tablet Take 1 tablet (5 mg total) by mouth daily. For mood control 90 tablet 2   . buPROPion (WELLBUTRIN XL) 300 MG 24 hr tablet Take 1 tablet (300 mg total) by mouth every morning. 90 tablet 2  . fluticasone-salmeterol (ADVAIR HFA) 115-21 MCG/ACT inhaler Inhale 2 puffs into the lungs 2 (two) times daily. For shortness of breath 1 Inhaler 12  . gabapentin (NEURONTIN) 300 MG capsule Take 1 capsule (300 mg total) by mouth 2 (two) times daily. For agitation/pain managment 180 capsule 2  . hydrOXYzine (ATARAX/VISTARIL) 25 MG tablet Take 1 tablet (25 mg) three times daily as needed: For anxiety 270 tablet 2  . ibuprofen (ADVIL,MOTRIN) 200 MG tablet Take 800 mg by mouth every 8 (eight) hours.    . pantoprazole (PROTONIX) 40 MG tablet Take 1 tablet (40 mg total) by mouth daily. For acid reflux    . PARoxetine (PAXIL-CR) 25 MG 24 hr tablet Take 1 tablet (25 mg total) by mouth daily. 90 tablet 2  . rOPINIRole (REQUIP) 0.5 MG tablet Take 1 tablet (0.5 mg total) by mouth at bedtime. For restless leg syndrome    . traZODone (DESYREL) 50 MG tablet Take 1 tablet (50 mg total) by mouth  at bedtime as needed and may repeat dose one time if needed for sleep. 180 tablet 2   No current facility-administered medications for this visit.    Previous Psychotropic Medications:  Medication Dose   Paxil CR, Ambien                        Substance Abuse History in the last 12 months: Substance Age of 1st Use Last Use Amount Specific Type  Nicotine      Alcohol    was drinking 2-3 ounces of liquor per day until about 3 weeks ago    Cannabis      Opiates      Cocaine      Methamphetamines      LSD      Ecstasy      Benzodiazepines      Caffeine      Inhalants      Others:                          Medical Consequences of Substance Abuse: none  Legal Consequences of Substance Abuse: none  Family Consequences of Substance Abuse: none  Blackouts:  No DT's:  No Withdrawal Symptoms:  No None  Social History: Current Place of Residence: [place] [address] of  Birth: [place] Family Members: Husband daughter 2 grandchildren Marital Status:  Married Children:   Sons:   Daughters: 1 Relationships: Education:  Corporate treasurer Problems/Performance:  Religious Beliefs/Practices: Christian History of Abuse: none Armed forces technical officer; 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 Legal History: none Hobbies/Interests: Animals gardening painting  Family History:   Family History  Problem Relation Age of Onset  . Deep vein thrombosis Neg Hx   . Pulmonary embolism Neg Hx   . Heart disease Neg Hx   . Heart failure Neg Hx   . Bipolar disorder Sister   . Alcohol abuse Maternal Grandfather     Mental Status Examination/Evaluation: Objective:  Appearance: Casual and Well Groomed    Eye Contact::  Good  Speech:  Clear and Coherent  Volume:  Decreased  Mood: Good   Affect: Bright   Thought Process:  Coherent  Orientation:  Full (Time, Place, and Person)  Thought Content:  Rumination  Suicidal Thoughts:  No  Homicidal Thoughts:  No  Judgement:  Good  Insight:  Good  Psychomotor Activity:  Tremor is almost gone   Akathisia:  No  Handed:  Right  AIMS (if indicated):    Assets:  Communication Skills Desire for Improvement Resilience Social Support Talents/Skills    Laboratory/X-Ray Psychological Evaluation(s)   Reviewed in chart      Assessment:  Axis I: Major Depression, Recurrent severe  AXIS I Major Depression, Recurrent severe  AXIS II Deferred  AXIS III Past Medical History  Diagnosis Date  . Asthma   . Depression   . Reflux   . Chronic pain began in 2011 after jooint replacements     AXIS IV other psychosocial or environmental problems  AXIS V 51-60 moderate symptoms   Treatment Plan/Recommendations:  Plan of Care: Medication management   Laboratory:    Psychotherapy: She is seeing Florencia Reasons here   Medications: She'll continue all current effective medications   Routine PRN Medications:   No  Consultations:   Safety Concerns:  She denies current thoughts of self-harm   Other: She'll return in 3 months    Daiveon Markman,  Gavin PoundEBORAH, MD 4/1/20162:41 PM

## 2014-05-08 ENCOUNTER — Ambulatory Visit (HOSPITAL_COMMUNITY): Payer: Self-pay | Admitting: Psychiatry

## 2014-05-26 ENCOUNTER — Ambulatory Visit (INDEPENDENT_AMBULATORY_CARE_PROVIDER_SITE_OTHER): Payer: 59 | Admitting: Psychiatry

## 2014-05-26 DIAGNOSIS — F333 Major depressive disorder, recurrent, severe with psychotic symptoms: Secondary | ICD-10-CM | POA: Diagnosis not present

## 2014-05-26 NOTE — Patient Instructions (Signed)
Discussed orally 

## 2014-05-26 NOTE — Progress Notes (Signed)
        THERAPIST PROGRESS NOTE  Session Time: Friday 05/26/2014 9:05 AM - 9:58 AM   Participation Level: Active  Behavioral Response: Well GroomedAlert./anxious  Type of Therapy: Individual Therapy  Treatment Goals addressed:  Alleviate symptoms of depression (crying spells, loss of interest, depressed mood) in return to previous level of functioning  Implement coping skills reducing anxiety and excessive worry along with panic attack  Interventions: Supportive, ACT  Summary: Rebecca [name] is a 59 y.o. female who is referred for follow up treatment upon [facility] she was treated fr om 09/04/2013 -09/08/2013 for depression and a suicide attempt. Per patient's report, this was triggered by 6 major deaths in the past 6 months. These deaths include the loss of a best friend, 2 aunts, another friend, and her 58 year old dog, a [name]. Patient reports writing a 3-page suicide note and using liquor along with taking a half bottle of Ambien and a half bottle of her husband's oxycodone. She denies any suicidal ideations since discharge but reports feeling depressed and down like a cloud is over her head. She has crying spells daily and reports no interest in previously enjoyed activities/ She is experiencing poor concentration along with memory difficulty and states forgetting things like when she took her medication or what she ate.She is experiencing anxiety and worry about returning to work. 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 She also expresses concern regarding her speech stating difficulty and sometimes feeling like she is dyslexic. She also reports stress related to marriage as husband suffers from chronic pain due to severe peripheral  neuropathy and is often very irritable. 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 She has maintained social involvement and has improved self-care in pacing self. She has been walking and journaling. Patient reports having more balance in life. She continues to experience grief and loss issues regarding her sister and her marriage. Patient has difficulty accepting negative thoughts and feelings. She expresses guilt regarding some of the thoughts and feelings about her sister. This confusing for patient as she also misses her sister. Suicidal/Homicidal: No  Therapist Response: Therapist works with patient to praise use of schedule, to accept thoughts and feelings as part of the human experience  Plan: Return again in 4 weeks  Diagnosis: Axis I: MDD.    Axis II: No diagnosis    Diania Co, LCSW 05/26/2014

## 2014-06-07 ENCOUNTER — Other Ambulatory Visit: Payer: Self-pay | Admitting: Family Medicine

## 2014-06-07 DIAGNOSIS — E2839 Other primary ovarian failure: Secondary | ICD-10-CM

## 2014-06-08 ENCOUNTER — Other Ambulatory Visit: Payer: Self-pay | Admitting: Family Medicine

## 2014-06-08 DIAGNOSIS — Z1231 Encounter for screening mammogram for malignant neoplasm of breast: Secondary | ICD-10-CM

## 2014-06-13 ENCOUNTER — Other Ambulatory Visit: Payer: Self-pay

## 2014-06-16 ENCOUNTER — Ambulatory Visit (HOSPITAL_COMMUNITY): Payer: Self-pay | Admitting: Psychiatry

## 2014-06-21 ENCOUNTER — Telehealth (HOSPITAL_COMMUNITY): Payer: Self-pay | Admitting: *Deleted

## 2014-06-26 ENCOUNTER — Telehealth (HOSPITAL_COMMUNITY): Payer: Self-pay | Admitting: *Deleted

## 2014-06-28 ENCOUNTER — Ambulatory Visit
Admission: RE | Admit: 2014-06-28 | Discharge: 2014-06-28 | Disposition: A | Discharge status: Home / Self Care | Payer: 59 | Source: Ambulatory Visit | Attending: Family Medicine | Admitting: Family Medicine

## 2014-06-28 DIAGNOSIS — Z1231 Encounter for screening mammogram for malignant neoplasm of breast: Secondary | ICD-10-CM

## 2014-06-28 DIAGNOSIS — E2839 Other primary ovarian failure: Secondary | ICD-10-CM

## 2014-07-07 ENCOUNTER — Ambulatory Visit (HOSPITAL_COMMUNITY): Payer: Self-pay | Admitting: Psychiatry

## 2014-07-12 ENCOUNTER — Telehealth (HOSPITAL_COMMUNITY): Payer: Self-pay | Admitting: *Deleted

## 2014-07-12 NOTE — Telephone Encounter (Signed)
Called pt to inform her provider will be out of office and to call office back and resch appt. Number provided

## 2014-07-18 ENCOUNTER — Ambulatory Visit (HOSPITAL_COMMUNITY): Payer: Self-pay | Admitting: Psychiatry

## 2014-07-18 ENCOUNTER — Ambulatory Visit (INDEPENDENT_AMBULATORY_CARE_PROVIDER_SITE_OTHER): Payer: 59 | Admitting: Psychiatry

## 2014-07-18 DIAGNOSIS — F333 Major depressive disorder, recurrent, severe with psychotic symptoms: Secondary | ICD-10-CM | POA: Diagnosis not present

## 2014-07-18 NOTE — Patient Instructions (Signed)
Discussed orally 

## 2014-07-18 NOTE — Progress Notes (Signed)
        THERAPIST PROGRESS NOTE  Session Time:  Tuesday 07/18/2014 10:10 AM - 10:58 AM  Participation Level: Active  Behavioral Response: Well GroomedAlert./euthymic  Type of Therapy: Individual Therapy  Treatment Goals addressed:  Alleviate symptoms of depression (crying spells, loss of interest, depressed mood) in return to previous level of functioning  Implement coping skills reducing anxiety and excessive worry along with panic attack  Interventions: Supportive, ACT  Summary: Rebecca [name] is a 59 y.o. female who is referred for follow up treatment upon discharge from hospitalization at the [facility] where she was treated fr om 09/04/2013 -09/08/2013 for depression and a suicide attempt. Per patient's report, this was triggered by 6 major deaths in the past 6 months. These deaths include the loss of a best friend, 2 aunts, another friend, and her 84 year old dog, a Rushie Chestnut. Patient reports writing a 3-page suicide note and using liquor along with taking a half bottle of Ambien and a half bottle of her husband's oxycodone. She denies any suicidal ideations since discharge but reports feeling depressed and down like a cloud is over her head. She has crying spells daily and reports no interest in previously enjoyed activities/ She is experiencing poor concentration along with memory difficulty and states forgetting things like when she took her medication or what she ate.She is experiencing anxiety and worry about returning to work. 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 She also expresses concern regarding her speech stating difficulty and sometimes feeling like she is dyslexic. She also reports stress related to marriage as husband suffers from chronic pain due to severe  peripheral neuropathy and is often very irritable. 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 She has maintained involvement in activity and socializes with family and friends. Patient is excited as she started dating someone two weeks ago. She has known this man for 13 years and says they talk to each other daily. They also have gone on various outings.  She states she is very happy. She reports she feels a little down sometime when she is home alone but is coping well with this by using coping statements and activity scheduling. She is continuing to adjust from being married to being single. She maintains a positive relationship with her former husband but says she is experiencing freedom from the responsibility and stress that were present in the marriage.   Suicidal/Homicidal: No  Therapist Response: Therapist works with patient to process feelings, identify disadvantages and advantages of being alone, discuss being comfortable with self, review progress, and discuss possible termination after two more monthly sessions.  Plan: Return again in 4 weeks  Diagnosis: Axis I: MDD.    Axis II: No diagnosis    Ryo Klang, LCSW 07/18/2014

## 2014-08-11 ENCOUNTER — Ambulatory Visit (INDEPENDENT_AMBULATORY_CARE_PROVIDER_SITE_OTHER): Payer: 59 | Admitting: Psychiatry

## 2014-08-11 ENCOUNTER — Encounter (HOSPITAL_COMMUNITY): Payer: Self-pay | Admitting: Psychiatry

## 2014-08-11 VITALS — BP 129/79 | HR 75 | Ht 64.0 in | Wt 212.4 lb

## 2014-08-11 DIAGNOSIS — F333 Major depressive disorder, recurrent, severe with psychotic symptoms: Secondary | ICD-10-CM

## 2014-08-11 DIAGNOSIS — F332 Major depressive disorder, recurrent severe without psychotic features: Secondary | ICD-10-CM

## 2014-08-11 MED ORDER — ARIPIPRAZOLE 5 MG PO TABS: 5.0000 mg | ORAL_TABLET | Freq: Every day | ORAL | Status: DC

## 2014-08-11 MED ORDER — GABAPENTIN 300 MG PO CAPS: 300.0000 mg | ORAL_CAPSULE | Freq: Two times a day (BID) | ORAL | Status: DC

## 2014-08-11 MED ORDER — HYDROXYZINE HCL 25 MG PO TABS: ORAL_TABLET | ORAL | Status: DC

## 2014-08-11 MED ORDER — BUPROPION HCL ER (XL) 300 MG PO TB24: 300.0000 mg | ORAL_TABLET | ORAL | Status: DC

## 2014-08-11 MED ORDER — PAROXETINE HCL ER 25 MG PO TB24: 25.0000 mg | ORAL_TABLET | Freq: Every day | ORAL | Status: DC

## 2014-08-11 NOTE — Progress Notes (Signed)
Patient ID: Rebecca [name], female   DOB: May 17, 1955, 59 y.o.   MRN: [id number] Patient ID: Rebecca [name], female   DOB: [birth year], 59 y.o.   MRN: [id number] Patient ID: Rebecca [name], female   DOB: [birth year], 59 y.o.   MRN: [id number] Patient ID: Rebecca [name], female   DOB: May 20, 1955, 59 y.o.   MRN: [id number] Patient ID: Rebecca [name], female   DOB: October 17, 1955, 59 y.o.   MRN: [id number] Patient ID: Rebecca [name], female   DOB: [birth year], 59 y.o.   MRN: [id number]  Psychiatric Assessment Adult  Patient Identification:  Rebecca [name] Date of Evaluation:  08/11/2014 Chief Complaint: I'm doing better History of Chief Complaint:   Chief Complaint  Patient presents with  . Depression  . Anxiety  . Follow-up    Anxiety Symptoms include nervous/anxious behavior.     this patient is a 59 year old separated white female who lives alone in [place]. 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 She has one daughter and 2 grandchildren  The patient was referred by [facility] where she was hospitalized July 31 through August 6 after an intentional overdose on Ambien and oxycodone. The patient has had one prior overdose attempt in 2004. This occurred after she found that her first husband had had an affair with her sister years prior.  After the hospital patient 2004 the patient was followed for time by [facility] in [place]. She did fairly well on Paxil CR. Most recently she has been treated [facility] group primary care and Abilify was added. Over the last year however she has lost many important people in her life. 2 of her aunt her best friend another friend and her dog all died within a short period of time. She was going downhill and getting more and more depressed and was dealing with that by drinking 2-3 ounces of liquor per day. She got increasingly despondent and eventually decided to  take her life and rose suicide note to her family. Reportedly she  was found the next morning and brought to the ER in the ICU and consequently to the behavioral health hospital.  While there are Wellbutrin and trazodone were added. The patient wasn't sleeping well prior to hospitalization but she sleeping much better now. Her mood is improved but her energy and motivation are still poor and she's developed tremor in both hands predicted on the left. 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 It's hard for her to type because of the tremor and she can't remember anything. She's no longer suicidal but just doesn't have much energy to do much of anything right now. She's denies auditory visual sensations or paranoia. She is no longer drinking  The patient returns today after 3 months. She is now living on her own in [place]. She is more active and has lost about 40 pounds in the last 6 months or so. She is made many friends in her apartment complex and is dating someone new as well. Her mood is been excellent and her energy is good. She thinks her current combination of medicines is working well for her Review of Systems  Constitutional: Positive for activity change.  HENT: Negative.   Respiratory: Negative.   Cardiovascular: Negative.   Gastrointestinal: Negative.   Endocrine: Negative.   Genitourinary:  Negative.   Musculoskeletal: Positive for joint swelling and arthralgias.  Allergic/Immunologic: Negative.   Neurological: Positive for tremors.  Hematological: Negative.   Psychiatric/Behavioral: Positive for dysphoric mood. The patient is nervous/anxious.    Physical Examnot done  Depressive Symptoms: depressed mood, anhedonia, insomnia, difficulty concentrating, anxiety, loss of energy/fatigue,  (Hypo) Manic Symptoms:   Elevated Mood:  No Irritable Mood:  No Grandiosity:   No Distractibility:  Yes Labiality of Mood:  Yes Delusions:  No Hallucinations:  No Impulsivity:  No Sexually Inappropriate Behavior:  No Financial Extravagance:  No Flight of Ideas:  No  Anxiety Symptoms: Excessive Worry:  Yes Panic Symptoms:  Yes Agoraphobia:  No Obsessive Compulsive: No  Symptoms: None, Specific Phobias:  No Social Anxiety:  Yes  Psychotic Symptoms:  Hallucinations: No None Delusions:  No Paranoia:  No   Ideas of Reference:  No  PTSD Symptoms: Ever had a traumatic exposure:  No Had a traumatic exposure in the last month:  No Re-experiencing: No None Hypervigilance:  No Hyperarousal: No None Avoidance: No None  Traumatic Brain Injury: No   Past Psychiatric History: Diagnosis: Maj. depression   Hospitalizations: In 2004 and again last month   Outpatient Care: At [facility] past   Substance Abuse Care: none  Self-Mutilation: none  Suicidal Attempts: Overdose attempts twice   Violent Behaviors:none   Past Medical History:   Past Medical History  Diagnosis Date  . Asthma   . Depression   . Reflux   . Chronic pain began in 2011 after jooint replacements   History of Loss of Consciousness:  Yes Seizure History:  No Cardiac History:  No Allergies:   Allergies  Allergen Reactions  . Sulfa Antibiotics Other (See Comments)    unknown   Current Medications:  Current Outpatient Prescriptions  Medication Sig Dispense Refill  . albuterol (PROVENTIL HFA;VENTOLIN HFA) 108 (90 BASE) MCG/ACT inhaler Inhale 2 puffs into the lungs every 4 (four) hours as needed for wheezing or shortness of breath.    . ARIPiprazole (ABILIFY) 5 MG tablet Take 1 tablet (5 mg total) by mouth daily. For mood control 90 tablet 2  . buPROPion (WELLBUTRIN XL) 300 MG 24 hr tablet Take 1 tablet (300 mg total) by mouth every morning. 90 tablet 2  . fluticasone-salmeterol (ADVAIR HFA) 115-21 MCG/ACT inhaler Inhale 2 puffs into the lungs 2 (two) times daily. For  shortness of breath 1 Inhaler 12  . gabapentin (NEURONTIN) 300 MG capsule Take 1 capsule (300 mg total) by mouth 2 (two) times daily. For agitation/pain managment 180 capsule 2  . hydrOXYzine (ATARAX/VISTARIL) 25 MG tablet Take 1 tablet (25 mg) three times daily as needed: For anxiety 270 tablet 2  . ibuprofen (ADVIL,MOTRIN) 200 MG tablet Take 800 mg by mouth every 8 (eight) hours.    . pantoprazole (PROTONIX) 40 MG tablet Take 1 tablet (40 mg total) by mouth daily. For acid reflux    . PARoxetine (PAXIL-CR) 25 MG 24 hr tablet Take 1 tablet (25 mg total) by mouth daily. 90 tablet 2  . rOPINIRole (REQUIP) 0.5 MG tablet Take 1 tablet (0.5 mg total) by mouth at bedtime. For restless leg syndrome    . traZODone (DESYREL) 50 MG tablet Take 1 tablet (50 mg total) by mouth at bedtime as needed and may repeat dose one time if needed for sleep. 180 tablet 2   No current facility-administered medications for this visit.    Previous Psychotropic Medications:  Medication Dose   Paxil  CR, Ambien                        Substance Abuse History in the last 12 months: Substance Age of 1st Use Last Use Amount Specific Type  Nicotine      Alcohol    was drinking 2-3 ounces of liquor per day until about 3 weeks ago    Cannabis      Opiates      Cocaine      Methamphetamines      LSD      Ecstasy      Benzodiazepines      Caffeine      Inhalants      Others:                          Medical Consequences of Substance Abuse: none  Legal Consequences of Substance Abuse: none  Family Consequences of Substance Abuse: none  Blackouts:  No DT's:  No Withdrawal Symptoms:  No None  Social History: [place] Place [place] Residence[place] [address] [place] Place [place] Birth[place] Family Members: Husband daughter 2 grandchildren Marital Status:  Married Children:   Sons:   Daughters: 1 Relationships: 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 Legal History: none Hobbies/Interests: Animals gardening painting  Family History:   Family History  Problem Relation Age of Onset  . Deep vein thrombosis Neg Hx   . Pulmonary embolism Neg Hx   . Heart disease Neg Hx   . Heart failure Neg Hx   . Bipolar disorder Sister   . Alcohol abuse Maternal Grandfather     Mental Status Examination/Evaluation: Objective:  Appearance: Casual and Well Groomed    Eye Contact::  Good  Speech:  Clear and Coherent  Volume:  Decreased  Mood: Good   Affect: Bright   Thought Process:  Coherent  Orientation:  Full (Time, Place, and Person)  Thought Content:  Rumination  Suicidal Thoughts:  No  Homicidal Thoughts:  No  Judgement:  Good  Insight:  Good  Psychomotor Activity:  Tremor is almost gone   Akathisia:  No  Handed:  Right  AIMS (if indicated):    Assets:  Communication Skills Desire for Improvement Resilience Social Support Talents/Skills    Laboratory/X-Ray Psychological Evaluation(s)   Reviewed in chart      Assessment:  Axis I: Major Depression, Recurrent severe  AXIS I Major Depression, Recurrent severe  AXIS II Deferred  AXIS III Past Medical History  Diagnosis Date  . Asthma   . Depression   . Reflux   . Chronic pain began in 2011 after jooint replacements     AXIS IV other psychosocial or environmental problems  AXIS V 51-60 moderate symptoms   Treatment Plan/Recommendations:  Plan of Care: Medication management   Laboratory:    Psychotherapy: She is seeing Florencia ReasonsPeggy Bynum here   Medications: She'll continue Wellbutrin and Paxil for depression, hydroxyzine and gabapentin for anxiety and Abilify for mood stabilization   Routine PRN Medications:  No  Consultations:   Safety Concerns:  She denies current thoughts of self-harm   Other: She'll return in 3 months    Diannia RuderOSS, DEBORAH,  MD 7/8/20162:19 PM

## 2014-08-21 ENCOUNTER — Ambulatory Visit (HOSPITAL_COMMUNITY): Payer: Self-pay | Admitting: Psychiatry

## 2014-08-25 ENCOUNTER — Ambulatory Visit (HOSPITAL_COMMUNITY): Payer: Self-pay | Admitting: Psychiatry

## 2014-08-31 ENCOUNTER — Ambulatory Visit (INDEPENDENT_AMBULATORY_CARE_PROVIDER_SITE_OTHER): Payer: 59 | Admitting: Psychiatry

## 2014-08-31 DIAGNOSIS — F333 Major depressive disorder, recurrent, severe with psychotic symptoms: Secondary | ICD-10-CM | POA: Diagnosis not present

## 2014-08-31 NOTE — Patient Instructions (Signed)
Discussed orally 

## 2014-08-31 NOTE — Progress Notes (Signed)
         THERAPIST PROGRESS NOTE  Session Time:  Tuesday 07/18/2014 10:10 AM - 10:58 AM  Participation Level: Active  Behavioral Response: Well GroomedAlert./euthymic  Type of Therapy: Individual Therapy  Treatment Goals addressed:  Alleviate symptoms of depression (crying spells, loss of interest, depressed mood) in return to previous level of functioning  Implement coping skills reducing anxiety and excessive worry along with panic attack  Interventions: Supportive, ACT  Summary: Rebecca [name] is a 59 y.o. female who is referred for follow up treatment upon discharge from hospitalization at the [facility] where she was treated fr om 09/04/2013 -09/08/2013 for depression and a suicide attempt. Per patient's report, this was triggered by 6 major deaths in the past 6 months. These deaths include the loss of a best friend, 2 aunts, another friend, and her 75 year old dog, a Rushie Chestnut. Patient reports writing a 3-page suicide note and using liquor along with taking a half bottle of Ambien and a half bottle of her husband's oxycodone. She denies any suicidal ideations since discharge but reports feeling depressed and down like a cloud is over her head. She has crying spells daily and reports no interest in previously enjoyed activities/ She is experiencing poor concentration along with memory difficulty and states forgetting things like when she took her medication or what she ate.She is experiencing anxiety and worry about returning to work. 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 She also expresses concern regarding her speech stating difficulty and sometimes feeling like she is dyslexic. She also reports stress related to marriage as husband suffers from chronic pain due to severe  peripheral neuropathy and is often very irritable. 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 She has maintained involvement in activity and socializes with family and friends. Patient reports relationship with female friend is going well. Patient reports continuing to enjoy life and is happy with her progress. She reports some anxiety from time to time and occasional panic attacks.   Suicidal/Homicidal: No  Therapist Response: Therapist works with patient to process feelings, identify maintenance strategies to avoid relapse, identify panic cycle and ways to intervene, and discuss possible termination at next session  Plan: Return again in 4- 5weeks. Patient agrees to use maintenance plan and read material about panic attacks provided in session.   Diagnosis: Axis I: MDD.    Axis II: No diagnosis    Tashanda Fuhrer, LCSW 08/31/2014

## 2014-10-12 ENCOUNTER — Ambulatory Visit (HOSPITAL_COMMUNITY): Payer: Self-pay | Admitting: Psychiatry

## 2014-11-06 ENCOUNTER — Ambulatory Visit (HOSPITAL_COMMUNITY): Payer: Self-pay | Admitting: Psychiatry

## 2014-11-10 ENCOUNTER — Encounter (HOSPITAL_COMMUNITY): Payer: Self-pay | Admitting: Psychiatry

## 2014-11-10 ENCOUNTER — Ambulatory Visit (INDEPENDENT_AMBULATORY_CARE_PROVIDER_SITE_OTHER): Payer: 59 | Admitting: Psychiatry

## 2014-11-10 VITALS — BP 120/76 | HR 78 | Ht 64.0 in | Wt 208.0 lb

## 2014-11-10 DIAGNOSIS — F333 Major depressive disorder, recurrent, severe with psychotic symptoms: Secondary | ICD-10-CM | POA: Diagnosis not present

## 2014-11-10 MED ORDER — CLONAZEPAM 0.5 MG PO TABS: 0.5000 mg | ORAL_TABLET | Freq: Every day | ORAL | Status: DC | PRN

## 2014-11-10 MED ORDER — TRAZODONE HCL 50 MG PO TABS: 50.0000 mg | ORAL_TABLET | Freq: Every evening | ORAL | Status: DC | PRN

## 2014-11-10 MED ORDER — PAROXETINE HCL ER 25 MG PO TB24: 25.0000 mg | ORAL_TABLET | Freq: Every day | ORAL | Status: DC

## 2014-11-10 MED ORDER — BUPROPION HCL ER (XL) 300 MG PO TB24: 300.0000 mg | ORAL_TABLET | ORAL | Status: DC

## 2014-11-10 MED ORDER — GABAPENTIN 300 MG PO CAPS: 300.0000 mg | ORAL_CAPSULE | Freq: Two times a day (BID) | ORAL | Status: DC

## 2014-11-10 MED ORDER — ARIPIPRAZOLE 5 MG PO TABS: 5.0000 mg | ORAL_TABLET | Freq: Every day | ORAL | Status: DC

## 2014-11-10 NOTE — Progress Notes (Signed)
Patient ID: Rebecca [name], female   DOB: 15-Sep-1955, 59 y.o.   MRN: [id number] Patient ID: Rebecca [name], female   DOB: 1955-08-07, 59 y.o.   MRN: [id number] Patient ID: Rebecca [name], female   DOB: 1955-06-21, 59 y.o.   MRN: [id number] Patient ID: Rebecca [name], female   DOB: 08/09/1955, 59 y.o.   MRN: [id number] Patient ID: Rebecca [name], female   DOB: Jan 22, 1956, 59 y.o.   MRN: [id number] Patient ID: Rebecca [name], female   DOB: March 28, 1955, 59 y.o.   MRN: [id number] Patient ID: Rebecca [name], female   DOB: 1955-02-29, 59 y.o.   MRN: [id number]  Psychiatric Assessment Adult  Patient Identification:  Rebecca [name] Date of Evaluation:  11/10/2014 Chief Complaint: I'm doing better History of Chief Complaint:   Chief Complaint  Patient presents with  . Depression  . Anxiety  . Follow-up    Depression        Past medical history includes anxiety.   Anxiety Symptoms include nervous/anxious behavior.     this patient is a 59 year old separated white female who lives alone in [place]. 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 She has one daughter and 2 grandchildren  The patient was referred by [facility] where she was hospitalized July 31 through August 6 after an intentional overdose on Ambien and oxycodone. The patient has had one prior overdose attempt in 2004. This occurred after she found that her first husband had had an affair with her sister years prior.  After the hospital patient 2004 the patient was followed for time by [facility] in [place]. She did fairly well on Paxil CR. Most recently she has been treated [facility] primary care and Abilify was added. Over the last year however she has lost many important people in her life. 2 of her aunt her best friend another friend and her dog all died within a short period of time. She was going downhill and getting more and  more depressed and was dealing with that by drinking 2-3 ounces of liquor per day. She got increasingly despondent and eventually decided to take her life and rose suicide note to her family. Reportedly she  was found the next morning and brought to the ER in the ICU and consequently to the behavioral health hospital.  While there are Wellbutrin and trazodone were added. The patient wasn't sleeping well prior to hospitalization but she sleeping much better now. Her mood is improved but her energy and motivation are still poor and she's developed tremor in both hands predicted on the left. 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 It's hard for her to type because of the tremor and she can't remember anything. She's no longer suicidal but just doesn't have much energy to do much of anything right now. She's denies auditory visual sensations or paranoia. She is no longer drinking  The patient returns today after 3 months. She is still on disability but has returned to work at [employer] for one day a week. Her mood is been excellent and she is sleeping well and she continues to lose weight through dieting. She does state that she is very anxious now when she goes to work and would like something low dose to take for anxiety and I suggested  clonazepam. She doesn't have any thoughts of self-harm and seems very happy in her current life Review of Systems  Constitutional: Positive for activity change.  HENT: Negative.   Respiratory: Negative.   Cardiovascular: Negative.   Gastrointestinal: Negative.   Endocrine: Negative.   Genitourinary: Negative.   Musculoskeletal: Positive for joint swelling and arthralgias.  Allergic/Immunologic: Negative.   Neurological: Positive for tremors.  Hematological: Negative.   Psychiatric/Behavioral: Positive for depression and dysphoric mood. The patient is  nervous/anxious.    Physical Examnot done  Depressive Symptoms: depressed mood, anhedonia, insomnia, difficulty concentrating, anxiety, loss of energy/fatigue,  (Hypo) Manic Symptoms:   Elevated Mood:  No Irritable Mood:  No Grandiosity:  No Distractibility:  Yes Labiality of Mood:  Yes Delusions:  No Hallucinations:  No Impulsivity:  No Sexually Inappropriate Behavior:  No Financial Extravagance:  No Flight of Ideas:  No  Anxiety Symptoms: Excessive Worry:  Yes Panic Symptoms:  Yes Agoraphobia:  No Obsessive Compulsive: No  Symptoms: None, Specific Phobias:  No Social Anxiety:  Yes  Psychotic Symptoms:  Hallucinations: No None Delusions:  No Paranoia:  No   Ideas of Reference:  No  PTSD Symptoms: Ever had a traumatic exposure:  No Had a traumatic exposure in the last month:  No Re-experiencing: No None Hypervigilance:  No Hyperarousal: No None Avoidance: No None  Traumatic Brain Injury: No   Past Psychiatric History: Diagnosis: Maj. depression   Hospitalizations: In 2004 and again last month   Outpatient Care: At [facility] past   Substance Abuse Care: none  Self-Mutilation: none  Suicidal Attempts: Overdose attempts twice   Violent Behaviors:none   Past Medical History:   Past Medical History  Diagnosis Date  . Asthma   . Depression   . Reflux   . Chronic pain began in 2011 after jooint replacements   History of Loss of Consciousness:  Yes Seizure History:  No Cardiac History:  No Allergies:   Allergies  Allergen Reactions  . Sulfa Antibiotics Other (See Comments)    unknown   Current Medications:  Current Outpatient Prescriptions  Medication Sig Dispense Refill  . albuterol (PROVENTIL HFA;VENTOLIN HFA) 108 (90 BASE) MCG/ACT inhaler Inhale 2 puffs into the lungs every 4 (four) hours as needed for wheezing or shortness of breath.    . ARIPiprazole (ABILIFY) 5 MG tablet Take 1 tablet (5 mg total) by mouth daily. For mood  control 90 tablet 2  . buPROPion (WELLBUTRIN XL) 300 MG 24 hr tablet Take 1 tablet (300 mg total) by mouth every morning. 90 tablet 2  . clonazePAM (KLONOPIN) 0.5 MG tablet Take 1 tablet (0.5 mg total) by mouth daily as needed for anxiety. 30 tablet 2  . fluticasone-salmeterol (ADVAIR HFA) 115-21 MCG/ACT inhaler Inhale 2 puffs into the lungs 2 (two) times daily. For shortness of breath 1 Inhaler 12  . gabapentin (NEURONTIN) 300 MG capsule Take 1 capsule (300 mg total) by mouth 2 (two) times daily. For agitation/pain managment 180 capsule 2  . hydrOXYzine (ATARAX/VISTARIL) 25 MG tablet Take 1 tablet (25 mg) three times daily as needed: For anxiety 270 tablet 2  . ibuprofen (ADVIL,MOTRIN) 200 MG tablet Take 800 mg by mouth every 8 (eight) hours.    . pantoprazole (PROTONIX) 40 MG tablet Take 1 tablet (40 mg total) by mouth daily. For acid reflux    . PARoxetine (PAXIL-CR) 25 MG 24 hr tablet Take 1 tablet (25 mg total) by mouth daily. 90 tablet 2  . rOPINIRole (REQUIP)  0.5 MG tablet Take 1 tablet (0.5 mg total) by mouth at bedtime. For restless leg syndrome    . traZODone (DESYREL) 50 MG tablet Take 1 tablet (50 mg total) by mouth at bedtime as needed and may repeat dose one time if needed for sleep. 180 tablet 2   No current facility-administered medications for this visit.    Previous Psychotropic Medications:  Medication Dose   Paxil CR, Ambien                        Substance Abuse History in the last 12 months: Substance Age of 1st Use Last Use Amount Specific Type  Nicotine      Alcohol    was drinking 2-3 ounces of liquor per day until about 3 weeks ago    Cannabis      Opiates      Cocaine      Methamphetamines      LSD      Ecstasy      Benzodiazepines      Caffeine      Inhalants      Others:                          Medical Consequences of Substance Abuse: none  Legal Consequences of Substance Abuse: none  Family Consequences of Substance Abuse:  none  Blackouts:  No DT's:  No Withdrawal Symptoms:  No None  Social History: Current Place of Residence: [place] [address] of Birth: [place] Family Members: Husband daughter 2 grandchildren Marital Status:  Married Children:   Sons:   Daughters: 1 Relationships: Education:  Corporate treasurer Problems/Performance:  Religious Beliefs/Practices: Christian History of Abuse: none Armed forces technical officer; 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 Legal History: none Hobbies/Interests: Animals gardening painting  Family History:   Family History  Problem Relation Age of Onset  . Deep vein thrombosis Neg Hx   . Pulmonary embolism Neg Hx   . Heart disease Neg Hx   . Heart failure Neg Hx   . Bipolar disorder Sister   . Alcohol abuse Maternal Grandfather     Mental Status Examination/Evaluation: Objective:  Appearance: Casual and Well Groomed    Eye Contact::  Good  Speech:  Clear and Coherent  Volume:  Decreased  Mood: Good   Affect: Bright   Thought Process:  Coherent  Orientation:  Full (Time, Place, and Person)  Thought Content:  Rumination  Suicidal Thoughts:  No  Homicidal Thoughts:  No  Judgement:  Good  Insight:  Good  Psychomotor Activity:  Tremor is almost gone   Akathisia:  No  Handed:  Right  AIMS (if indicated):    Assets:  Communication Skills Desire for Improvement Resilience Social Support Talents/Skills    Laboratory/X-Ray Psychological Evaluation(s)   Reviewed in chart      Assessment:  Axis I: Major Depression, Recurrent severe  AXIS I Major Depression, Recurrent severe  AXIS II Deferred  AXIS III Past Medical History  Diagnosis Date  . Asthma   . Depression   . Reflux   . Chronic pain began in 2011 after jooint replacements     AXIS IV other psychosocial or environmental problems  AXIS V 51-60 moderate symptoms   Treatment Plan/Recommendations:  Plan of Care: Medication management    Laboratory:    Psychotherapy: She is seeing Florencia Reasons here   Medications: She'll continue Wellbutrin and  Paxil for depression and gabapentin for anxiety and Abilify for mood stabilization. She'll add clonazepam 0.5 mg daily for breakthrough anxiety   Routine PRN Medications:  No  Consultations:   Safety Concerns:  She denies current thoughts of self-harm   Other: She'll return in 3 months    ROSS, DEBORAH, MD 10/7/20161:40 PM

## 2014-12-18 ENCOUNTER — Telehealth (HOSPITAL_COMMUNITY): Payer: Self-pay | Admitting: *Deleted

## 2014-12-22 ENCOUNTER — Telehealth (HOSPITAL_COMMUNITY): Payer: Self-pay | Admitting: *Deleted

## 2014-12-25 ENCOUNTER — Encounter (HOSPITAL_COMMUNITY): Payer: Self-pay | Admitting: Psychiatry

## 2014-12-25 ENCOUNTER — Ambulatory Visit (INDEPENDENT_AMBULATORY_CARE_PROVIDER_SITE_OTHER): Payer: 59 | Admitting: Psychiatry

## 2014-12-25 VITALS — BP 139/84 | HR 73 | Ht 64.0 in | Wt 208.6 lb

## 2014-12-25 DIAGNOSIS — F333 Major depressive disorder, recurrent, severe with psychotic symptoms: Secondary | ICD-10-CM

## 2014-12-25 MED ORDER — CLONAZEPAM 1 MG PO TABS: 1.0000 mg | ORAL_TABLET | Freq: Every day | ORAL | Status: DC

## 2014-12-25 NOTE — Progress Notes (Signed)
Patient ID: Rebecca [name], female   DOB: 1955/10/30, 59 y.o.   MRN: [id number] Patient ID: Rebecca [name], female   DOB: 1955/10/30, 59 y.o.   MRN: [id number] Patient ID: Rebecca [name], female   DOB: 1955/10/30, 59 y.o.   MRN: [id number] Patient ID: Rebecca [name], female   DOB: 1955/10/30, 59 y.o.   MRN: [id number] Patient ID: Rebecca [name], female   DOB: 1955/10/30, 59 y.o.   MRN: [id number] Patient ID: Rebecca [name], female   DOB: 1955/10/30, 59 y.o.   MRN: [id number] Patient ID: Rebecca [name], female   DOB: 1955/10/30, 59 y.o.   MRN: [id number] Patient ID: Rebecca [name], female   DOB: 1955/10/30, 59 y.o.   MRN: [id number]  Psychiatric Assessment Adult  Patient Identification:  Rebecca [name] Date of Evaluation:  12/25/2014 Chief Complaint: I'm doing better History of Chief Complaint:   Chief Complaint  Patient presents with  . Depression  . Anxiety  . Follow-up    Depression        Past medical history includes anxiety.   Anxiety Symptoms include nervous/anxious behavior.     this patient is a 59 year old separated white female who lives [place] in [place]. 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 She has one daughter and 2 grandchildren  The patient was [facility] by [facility] where she was hospitalized July 31 through August 6 after an intentional overdose on Ambien and oxycodone. The patient has had one prior overdose attempt in 2004. This occurred after she found that her first husband had had an affair with her sister years prior.  After the hospital patient 2004 the patient was followed for time by [facility] in [place]. She did fairly well on Paxil CR. Most recently she has been treated [facility] and Abilify was added. Over the last year however she has lost many important people in her life. 2 of her aunt her best friend another friend and her dog all  died within a short period of time. She was going downhill and getting more and more depressed and was dealing with that by drinking 2-3 ounces of liquor per day. She got increasingly despondent and eventually decided to take her life and wrote a suicide note to her family. Reportedly she  was found the next morning and brought to the ER in the ICU and consequently to the behavioral health hospital.  While there are Wellbutrin and trazodone were added. The patient wasn't sleeping well prior to hospitalization but she sleeping much better now. Her mood is improved but her energy and motivation are still poor and she's developed tremor in both hands predicted on the left. 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 It's hard for her to type because of the tremor and she can't remember anything. She's no longer suicidal but just doesn't have much energy to do much of anything right now. She's denies auditory visual sensations or paranoia. She is no longer drinking  The patient returns today after 6 weeks. She is still working at [employer] [facility] one day a week. She is very anxious when she goes there nitrite a low dose of clonazepam. She doesn't think it's quite enough to help her. She will therefore increase the dosage from 0.5-1 mg. Her mood  is generally been good and she is getting out with friends and family. She denies suicidal ideation Review of Systems  Constitutional: Positive for activity change.  HENT: Negative.   Respiratory: Negative.   Cardiovascular: Negative.   Gastrointestinal: Negative.   Endocrine: Negative.   Genitourinary: Negative.   Musculoskeletal: Positive for joint swelling and arthralgias.  Allergic/Immunologic: Negative.   Neurological: Positive for tremors.  Hematological: Negative.   Psychiatric/Behavioral: Positive for depression and dysphoric mood. The patient is  nervous/anxious.    Physical Examnot done  Depressive Symptoms: depressed mood, anhedonia, insomnia, difficulty concentrating, anxiety, loss of energy/fatigue,  (Hypo) Manic Symptoms:   Elevated Mood:  No Irritable Mood:  No Grandiosity:  No Distractibility:  Yes Labiality of Mood:  Yes Delusions:  No Hallucinations:  No Impulsivity:  No Sexually Inappropriate Behavior:  No Financial Extravagance:  No Flight of Ideas:  No  Anxiety Symptoms: Excessive Worry:  Yes Panic Symptoms:  Yes Agoraphobia:  No Obsessive Compulsive: No  Symptoms: None, Specific Phobias:  No Social Anxiety:  Yes  Psychotic Symptoms:  Hallucinations: No None Delusions:  No Paranoia:  No   Ideas of Reference:  No  PTSD Symptoms: Ever had a traumatic exposure:  No Had a traumatic exposure in the last month:  No Re-experiencing: No None Hypervigilance:  No Hyperarousal: No None Avoidance: No None  Traumatic Brain Injury: No   Past Psychiatric History: Diagnosis: Maj. depression   Hospitalizations: In 2004 and again last month   Outpatient Care: At [facility] in the past   Substance Abuse Care: none  Self-Mutilation: none  Suicidal Attempts: Overdose attempts twice   Violent Behaviors:none   Past Medical History:   Past Medical History  Diagnosis Date  . Asthma   . Depression   . Reflux   . Chronic pain began in 2011 after jooint replacements   History of Loss of Consciousness:  Yes Seizure History:  No Cardiac History:  No Allergies:   Allergies  Allergen Reactions  . Sulfa Antibiotics Other (See Comments)    unknown   Current Medications:  Current Outpatient Prescriptions  Medication Sig Dispense Refill  . albuterol (PROVENTIL HFA;VENTOLIN HFA) 108 (90 BASE) MCG/ACT inhaler Inhale 2 puffs into the lungs every 4 (four) hours as needed for wheezing or shortness of breath.    . ARIPiprazole (ABILIFY) 5 MG tablet Take 1 tablet (5 mg total) by mouth daily. For mood  control 90 tablet 2  . buPROPion (WELLBUTRIN XL) 300 MG 24 hr tablet Take 1 tablet (300 mg total) by mouth every morning. 90 tablet 2  . fluticasone-salmeterol (ADVAIR HFA) 115-21 MCG/ACT inhaler Inhale 2 puffs into the lungs 2 (two) times daily. For shortness of breath 1 Inhaler 12  . gabapentin (NEURONTIN) 300 MG capsule Take 1 capsule (300 mg total) by mouth 2 (two) times daily. For agitation/pain managment 180 capsule 2  . hydrOXYzine (ATARAX/VISTARIL) 25 MG tablet Take 1 tablet (25 mg) three times daily as needed: For anxiety 270 tablet 2  . ibuprofen (ADVIL,MOTRIN) 200 MG tablet Take 800 mg by mouth every 8 (eight) hours.    . pantoprazole (PROTONIX) 40 MG tablet Take 1 tablet (40 mg total) by mouth daily. For acid reflux    . PARoxetine (PAXIL-CR) 25 MG 24 hr tablet Take 1 tablet (25 mg total) by mouth daily. 90 tablet 2  . rOPINIRole (REQUIP) 0.5 MG tablet Take 1 tablet (0.5 mg total) by mouth at bedtime. For restless leg syndrome    . traZODone (  DESYREL) 50 MG tablet Take 1 tablet (50 mg total) by mouth at bedtime as needed and may repeat dose one time if needed for sleep. 180 tablet 2  . clonazePAM (KLONOPIN) 1 MG tablet Take 1 tablet (1 mg total) by mouth daily. 30 tablet 2   No current facility-administered medications for this visit.    Previous Psychotropic Medications:  Medication Dose   Paxil CR, Ambien                        Substance Abuse History in the last 12 months: Substance Age of 1st Use Last Use Amount Specific Type  Nicotine      Alcohol    was drinking 2-3 ounces of liquor per day until about 3 weeks ago    Cannabis      Opiates      Cocaine      Methamphetamines      LSD      Ecstasy      Benzodiazepines      Caffeine      Inhalants      Others:                          Medical Consequences of Substance Abuse: none  Legal Consequences of Substance Abuse: none  Family Consequences of Substance Abuse: none  Blackouts:  No DT's:   No Withdrawal Symptoms:  No None  Social History: Current Place of Residence: [place] [address] of Birth: [place] Family Members: Husband daughter 2 grandchildren Marital Status:  Married Children:   Sons:   Daughters: 1 Relationships: Education:  Corporate treasurer Problems/Performance:  Religious Beliefs/Practices: Christian History of Abuse: none Armed forces technical officer; 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 Legal History: none Hobbies/Interests: Animals gardening painting  Family History:   Family History  Problem Relation Age of Onset  . Deep vein thrombosis Neg Hx   . Pulmonary embolism Neg Hx   . Heart disease Neg Hx   . Heart failure Neg Hx   . Bipolar disorder Sister   . Alcohol abuse Maternal Grandfather     Mental Status Examination/Evaluation: Objective:  Appearance: Casual and Well Groomed    Eye Contact::  Good  Speech:  Clear and Coherent  Volume:  Decreased  Mood: Good   Affect: Bright   Thought Process:  Coherent  Orientation:  Full (Time, Place, and Person)  Thought Content:  Rumination  Suicidal Thoughts:  No  Homicidal Thoughts:  No  Judgement:  Good  Insight:  Good  Psychomotor Activity:  Tremor is almost gone   Akathisia:  No  Handed:  Right  AIMS (if indicated):    Assets:  Communication Skills Desire for Improvement Resilience Social Support Talents/Skills    Laboratory/X-Ray Psychological Evaluation(s)   Reviewed in chart      Assessment:  Axis I: Major Depression, Recurrent severe  AXIS I Major Depression, Recurrent severe  AXIS II Deferred  AXIS III Past Medical History  Diagnosis Date  . Asthma   . Depression   . Reflux   . Chronic pain began in 2011 after jooint replacements     AXIS IV other psychosocial or environmental problems  AXIS V 51-60 moderate symptoms   Treatment Plan/Recommendations:  Plan of Care: Medication management   Laboratory:    Psychotherapy: She is  seeing Florencia Reasons here   Medications: She'll continue Wellbutrin and Paxil for depression  and gabapentin for anxiety and Abilify for mood stabilization. She'll increase clonazepam to 1 mg daily for breakthrough anxiety   Routine PRN Medications:  No  Consultations:   Safety Concerns:  She denies current thoughts of self-harm   Other: She'll return in 3 months    Diannia Ruder, MD 11/21/20163:09 PM

## 2015-02-05 MED FILL — rOPINIRole HCL 0.5 MG TABS: 0.5 | 90 days supply | Qty: 90 | Fill #0

## 2015-02-05 MED FILL — clonazePAM 1 MG TABS: 1 | 30 days supply | Qty: 30 | Fill #1

## 2015-02-05 MED FILL — BUPROPION HCL XL 300 MG TAB: 300 | 90 days supply | Qty: 90 | Fill #1

## 2015-02-05 MED FILL — ARIPiprazole 5 MG TABS: 5 | 90 days supply | Qty: 90 | Fill #0

## 2015-02-07 ENCOUNTER — Ambulatory Visit (HOSPITAL_COMMUNITY): Payer: Self-pay | Admitting: Psychiatry

## 2015-03-02 MED FILL — ADVAIR 250/50 DISKUS: 250-50 | 90 days supply | Qty: 180 | Fill #0

## 2015-03-07 MED FILL — clonazePAM 1 MG TABS: 1 | 30 days supply | Qty: 30 | Fill #2

## 2015-03-27 ENCOUNTER — Encounter (HOSPITAL_COMMUNITY): Payer: Self-pay | Admitting: Psychiatry

## 2015-03-27 ENCOUNTER — Ambulatory Visit (INDEPENDENT_AMBULATORY_CARE_PROVIDER_SITE_OTHER): Payer: 59 | Admitting: Psychiatry

## 2015-03-27 VITALS — BP 131/77 | HR 68 | Ht 64.0 in | Wt 223.4 lb

## 2015-03-27 DIAGNOSIS — F333 Major depressive disorder, recurrent, severe with psychotic symptoms: Secondary | ICD-10-CM | POA: Diagnosis not present

## 2015-03-27 MED ORDER — BUPROPION HCL ER (XL) 300 MG PO TB24: 300.0000 mg | ORAL_TABLET | ORAL | Status: DC

## 2015-03-27 MED ORDER — ARIPIPRAZOLE 5 MG PO TABS: 5.0000 mg | ORAL_TABLET | Freq: Every day | ORAL | Status: DC

## 2015-03-27 MED ORDER — GABAPENTIN 300 MG PO CAPS: 300.0000 mg | ORAL_CAPSULE | Freq: Two times a day (BID) | ORAL | Status: DC

## 2015-03-27 MED ORDER — CLONAZEPAM 1 MG PO TABS: 1.0000 mg | ORAL_TABLET | Freq: Every day | ORAL | Status: DC

## 2015-03-27 MED ORDER — PAROXETINE HCL ER 25 MG PO TB24: 25.0000 mg | ORAL_TABLET | Freq: Every day | ORAL | Status: DC

## 2015-03-27 MED FILL — GABAPENTIN 300 MG CAPSULE: 300 | 90 days supply | Qty: 180 | Fill #0

## 2015-03-27 NOTE — Progress Notes (Signed)
Patient ID: Rebecca [name], female   DOB: [birth year], 60 y.o.   MRN: [id number] Patient ID: Rebecca [name], female   DOB: [birth year], 60 y.o.   MRN: [id number] Patient ID: Rebecca [name], female   DOB: November 14, 1955, 60 y.o.   MRN: [id number] Patient ID: Rebecca [name], female   DOB: 1956-02-01, 60 y.o.   MRN: [id number] Patient ID: Rebecca [name], female   DOB: 1955/07/25, 60 y.o.   MRN: [id number] Patient ID: Rebecca [name], female   DOB: 12-08-1955, 60 y.o.   MRN: [id number] Patient ID: Rebecca [name], female   DOB: 10-25-1955, 60 y.o.   MRN: [id number] Patient ID: Rebecca [name], female   DOB: [birth year], 60 y.o.   MRN: [id number] Patient ID: Rebecca [name], female   DOB: [birth year], 60 y.o.   MRN: [id number]  Psychiatric Assessment Adult  Patient Identification:  Rebecca [name] Date of Evaluation:  03/27/2015 Chief Complaint: I'm doing better History of Chief Complaint:   Chief Complaint  Patient presents with  . Depression  . Anxiety  . Follow-up    Depression        Past medical history includes anxiety.   Anxiety Symptoms include nervous/anxious behavior.     this patient is a 60 year old separated white female who lives alone in [place]. 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 She has one daughter and 2 grandchildren  The patient was referred by [facility] where she was hospitalized July 31 through August 6 after an intentional overdose on Ambien and oxycodone. The patient has had one prior overdose attempt in 2004. This occurred after she found that her first husband had had an affair with her sister years prior.  After the hospital patient 2004 the patient was followed for time by [facility] in [place]. She did fairly well on Paxil CR. Most recently she has been treated [facility] primary care and Abilify was added. Over the last year however she has lost many important  people in her life. 2 of her aunt her best friend another friend and her dog all died within a short period of time. She was going downhill and getting more and more depressed and was dealing with that by drinking 2-3 ounces of liquor per day. She got increasingly despondent and eventually decided to take her life and wrote a suicide note to her family. Reportedly she  was found the next morning and brought to the ER in the ICU and consequently to the behavioral health hospital.  While there are Wellbutrin and trazodone were added. The patient wasn't sleeping well prior to hospitalization but she sleeping much better now. Her mood is improved but her energy and motivation are still poor and she's developed tremor in both hands predicted on the left. 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 It's hard for her to type because of the tremor and she can't remember anything. She's no longer suicidal but just doesn't have much energy to do much of anything right now. She's denies auditory visual sensations or paranoia. She is no longer drinking  The patient returns today after 3 months. She has bought a house and has moved back to [place]. She is feeling really good about it. She still working at the [facility] care [facility] one day a week. She  still has some anxiety but it's quite controlled on the medication. She denies any symptoms of current depression. Her sleep is a little bit disturbed right now that she is getting used to the new house. Review of Systems  Constitutional: Positive for activity change.  HENT: Negative.   Respiratory: Negative.   Cardiovascular: Negative.   Gastrointestinal: Negative.   Endocrine: Negative.   Genitourinary: Negative.   Musculoskeletal: Positive for joint swelling and arthralgias.  Allergic/Immunologic: Negative.   Neurological: Positive for tremors.  Hematological:  Negative.   Psychiatric/Behavioral: Positive for depression and dysphoric mood. The patient is nervous/anxious.    Physical Examnot done  Depressive Symptoms: depressed mood, anhedonia, insomnia, difficulty concentrating, anxiety, loss of energy/fatigue,  (Hypo) Manic Symptoms:   Elevated Mood:  No Irritable Mood:  No Grandiosity:  No Distractibility:  Yes Labiality of Mood:  Yes Delusions:  No Hallucinations:  No Impulsivity:  No Sexually Inappropriate Behavior:  No Financial Extravagance:  No Flight of Ideas:  No  Anxiety Symptoms: Excessive Worry:  Yes Panic Symptoms:  Yes Agoraphobia:  No Obsessive Compulsive: No  Symptoms: None, Specific Phobias:  No Social Anxiety:  Yes  Psychotic Symptoms:  Hallucinations: No None Delusions:  No Paranoia:  No   Ideas of Reference:  No  PTSD Symptoms: Ever had a traumatic exposure:  No Had a traumatic exposure in the last month:  No Re-experiencing: No None Hypervigilance:  No Hyperarousal: No None Avoidance: No None  Traumatic Brain Injury: No   Past Psychiatric History: Diagnosis: Maj. depression   Hospitalizations: In 2004 and again last month   Outpatient Care: At [facility] past   Substance Abuse Care: none  Self-Mutilation: none  Suicidal Attempts: Overdose attempts twice   Violent Behaviors:none   Past Medical History:   Past Medical History  Diagnosis Date  . Asthma   . Depression   . Reflux   . Chronic pain began in 2011 after jooint replacements   History of Loss of Consciousness:  Yes Seizure History:  No Cardiac History:  No Allergies:   Allergies  Allergen Reactions  . Sulfa Antibiotics Other (See Comments)    unknown   Current Medications:  Current Outpatient Prescriptions  Medication Sig Dispense Refill  . albuterol (PROVENTIL HFA;VENTOLIN HFA) 108 (90 BASE) MCG/ACT inhaler Inhale 2 puffs into the lungs every 4 (four) hours as needed for wheezing or shortness of breath.     . ARIPiprazole (ABILIFY) 5 MG tablet Take 1 tablet (5 mg total) by mouth daily. For mood control 90 tablet 2  . buPROPion (WELLBUTRIN XL) 300 MG 24 hr tablet Take 1 tablet (300 mg total) by mouth every morning. 90 tablet 2  . clonazePAM (KLONOPIN) 1 MG tablet Take 1 tablet (1 mg total) by mouth daily. 30 tablet 2  . fluticasone-salmeterol (ADVAIR HFA) 115-21 MCG/ACT inhaler Inhale 2 puffs into the lungs 2 (two) times daily. For shortness of breath 1 Inhaler 12  . gabapentin (NEURONTIN) 300 MG capsule Take 1 capsule (300 mg total) by mouth 2 (two) times daily. For agitation/pain managment 180 capsule 2  . hydrOXYzine (ATARAX/VISTARIL) 25 MG tablet Take 1 tablet (25 mg) three times daily as needed: For anxiety 270 tablet 2  . ibuprofen (ADVIL,MOTRIN) 200 MG tablet Take 800 mg by mouth every 8 (eight) hours.    . pantoprazole (PROTONIX) 40 MG tablet Take 1 tablet (40 mg total) by mouth daily. For acid reflux    . PARoxetine (PAXIL-CR) 25 MG 24 hr tablet  Take 1 tablet (25 mg total) by mouth daily. 90 tablet 2  . rOPINIRole (REQUIP) 0.5 MG tablet Take 1 tablet (0.5 mg total) by mouth at bedtime. For restless leg syndrome    . traZODone (DESYREL) 50 MG tablet Take 1 tablet (50 mg total) by mouth at bedtime as needed and may repeat dose one time if needed for sleep. 180 tablet 2   No current facility-administered medications for this visit.    Previous Psychotropic Medications:  Medication Dose   Paxil CR, Ambien                        Substance Abuse History in the last 12 months: Substance Age of 1st Use Last Use Amount Specific Type  Nicotine      Alcohol    was drinking 2-3 ounces of liquor per day until about 3 weeks ago    Cannabis      Opiates      Cocaine      Methamphetamines      LSD      Ecstasy      Benzodiazepines      Caffeine      Inhalants      Others:                          Medical Consequences of Substance Abuse: none  Legal Consequences of Substance  Abuse: none  Family Consequences of Substance Abuse: none  Blackouts:  No DT's:  No Withdrawal Symptoms:  No None  Social History: Current Place of Residence: [place] [address] of Birth: [place] Family Members: Husband daughter 2 grandchildren Marital Status:  Married Children:   Sons:   Daughters: 1 Relationships: Education:  Corporate treasurer Problems/Performance:  Religious Beliefs/Practices: Christian History of Abuse: none Armed forces technical officer; 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 Legal History: none Hobbies/Interests: Animals gardening painting  Family History:   Family History  Problem Relation Age of Onset  . Deep vein thrombosis Neg Hx   . Pulmonary embolism Neg Hx   . Heart disease Neg Hx   . Heart failure Neg Hx   . Bipolar disorder Sister   . Alcohol abuse Maternal Grandfather     Mental Status Examination/Evaluation: Objective:  Appearance: Casual and Well Groomed    Eye Contact::  Good  Speech:  Clear and Coherent  Volume:  Decreased  Mood: Good   Affect: Bright   Thought Process:  Coherent  Orientation:  Full (Time, Place, and Person)  Thought Content:  Rumination  Suicidal Thoughts:  No  Homicidal Thoughts:  No  Judgement:  Good  Insight:  Good  Psychomotor Activity: Normal   Akathisia:  No  Handed:  Right  AIMS (if indicated):    Assets:  Communication Skills Desire for Improvement Resilience Social Support Talents/Skills    Laboratory/X-Ray Psychological Evaluation(s)   Reviewed in chart      Assessment:  Axis I: Major Depression, Recurrent severe  AXIS I Major Depression, Recurrent severe  AXIS II Deferred  AXIS III Past Medical History  Diagnosis Date  . Asthma   . Depression   . Reflux   . Chronic pain began in 2011 after jooint replacements     AXIS IV other psychosocial or environmental problems  AXIS V 51-60 moderate symptoms   Treatment Plan/Recommendations:  Plan of  Care: Medication management   Laboratory:    Psychotherapy: She  is seeing Florencia Reasons here   Medications: She'll continue Wellbutrin and Paxil for depression and gabapentin for anxiety and Abilify for mood stabilization. She'll continue clonazepam to 1 mg daily for breakthrough anxiety. She will use trazodone for sleep as needed   Routine PRN Medications:  No  Consultations:   Safety Concerns:  She denies current thoughts of self-harm   Other: She'll return in 3 months    Diannia Ruder, MD 2/21/20172:44 PM

## 2015-04-12 MED FILL — PAROXETINE CR 25 MG TABLET: 25 | 90 days supply | Qty: 90 | Fill #0

## 2015-04-12 MED FILL — clonazePAM 1 MG TABS: 1 | 30 days supply | Qty: 30 | Fill #0

## 2015-04-12 MED FILL — PANTOPRAZOLE SOD DR 40 MG T: 40 | 90 days supply | Qty: 90 | Fill #3

## 2015-04-12 MED FILL — VENTOLIN HFA 90 MCG INHALER: 108 (90 BAS | 29 days supply | Qty: 18 | Fill #2

## 2015-05-03 MED FILL — traMADol HCL 50 MG TABS: 50 | 90 days supply | Qty: 180 | Fill #1

## 2015-05-14 MED FILL — rOPINIRole HCL 0.5 MG TABS: 0.5 | 90 days supply | Qty: 90 | Fill #1

## 2015-05-14 MED FILL — clonazePAM 1 MG TABS: 1 | 30 days supply | Qty: 30 | Fill #1

## 2015-05-14 MED FILL — ARIPiprazole 5 MG TABS: 5 | 90 days supply | Qty: 90 | Fill #1

## 2015-05-15 ENCOUNTER — Telehealth (HOSPITAL_COMMUNITY): Payer: Self-pay | Admitting: *Deleted

## 2015-05-15 NOTE — Telephone Encounter (Signed)
noted 

## 2015-05-15 NOTE — Telephone Encounter (Signed)
Called pt to inform her that it is being done right now

## 2015-05-15 NOTE — Telephone Encounter (Signed)
Forms filled out for disability

## 2015-05-15 NOTE — Telephone Encounter (Signed)
patient returned your phone call.   She is waiting for you to call her back.

## 2015-05-16 MED FILL — ACYCLOVIR 200 MG CAPSULE: 200 | 5 days supply | Qty: 25 | Fill #0

## 2015-05-23 MED FILL — BUPROPION HCL XL 300 MG TAB: 300 | 90 days supply | Qty: 90 | Fill #2

## 2015-06-05 MED FILL — ADVAIR 250/50 DISKUS: 250-50 | 90 days supply | Qty: 180 | Fill #1

## 2015-06-07 NOTE — Telephone Encounter (Signed)
ANOTHER ATTEMPT TO CONTACT PATIENT REGARDING RESCHEDULING AN APPOINTMENT THAT IS SCHEDULED FOR 07/07/14.

## 2015-06-18 MED FILL — clonazePAM 1 MG TABS: 1 | 30 days supply | Qty: 30 | Fill #2

## 2015-06-19 MED FILL — VENTOLIN HFA 90 MCG INHALER: 108 (90 BAS | 50 days supply | Qty: 54 | Fill #0

## 2015-06-22 ENCOUNTER — Ambulatory Visit (INDEPENDENT_AMBULATORY_CARE_PROVIDER_SITE_OTHER): Payer: 59 | Admitting: Psychiatry

## 2015-06-22 ENCOUNTER — Encounter (HOSPITAL_COMMUNITY): Payer: Self-pay | Admitting: Psychiatry

## 2015-06-22 VITALS — BP 137/96 | HR 65 | Ht 64.0 in | Wt 233.6 lb

## 2015-06-22 DIAGNOSIS — F333 Major depressive disorder, recurrent, severe with psychotic symptoms: Secondary | ICD-10-CM

## 2015-06-22 MED ORDER — GABAPENTIN 300 MG PO CAPS: 300.0000 mg | ORAL_CAPSULE | Freq: Two times a day (BID) | ORAL | Status: DC

## 2015-06-22 MED ORDER — BUPROPION HCL ER (XL) 300 MG PO TB24: 300.0000 mg | ORAL_TABLET | ORAL | Status: DC

## 2015-06-22 MED ORDER — PAROXETINE HCL ER 25 MG PO TB24: 25.0000 mg | ORAL_TABLET | Freq: Every day | ORAL | Status: DC

## 2015-06-22 MED ORDER — ARIPIPRAZOLE 5 MG PO TABS: 5.0000 mg | ORAL_TABLET | Freq: Every day | ORAL | Status: DC

## 2015-06-22 MED ORDER — CLONAZEPAM 1 MG PO TABS: 1.0000 mg | ORAL_TABLET | Freq: Every day | ORAL | Status: DC

## 2015-06-22 MED FILL — GABAPENTIN 300 MG CAPSULE: 300 | 90 days supply | Qty: 180 | Fill #1

## 2015-06-22 NOTE — Progress Notes (Signed)
Patient ID: KABELLA CASSIDY, female   DOB: Mar 27, 1955, 60 y.o.   MRN: [id number] Patient ID: CAFFIE SOTTO, female   DOB: [birth year], 60 y.o.   MRN: [id number] Patient ID: ZANASIA HICKSON, female   DOB: [birth year], 60 y.o.   MRN: [id number] Patient ID: ZUZU BEFORT, female   DOB: [birth year], 60 y.o.   MRN: [id number] Patient ID: VIRGINIE JOSTEN, female   DOB: [birth year], 60 y.o.   MRN: [id number] Patient ID: LYRAH BRADT, female   DOB: [birth year], 60 y.o.   MRN: [id number] Patient ID: TARA RUD, female   DOB: 03/15/1955, 60 y.o.   MRN: [id number] Patient ID: ARNITRA SOKOLOSKI, female   DOB: 03-Mar-1955, 60 y.o.   MRN: [id number] Patient ID: EMINE LOPATA, female   DOB: 06-08-1955, 60 y.o.   MRN: [id number] Patient ID: MELEAH DEMEYER, female   DOB: [birth year], 60 y.o.   MRN: [id number]  Psychiatric Assessment Adult  Patient Identification:  BRICIA TAHER Date of Evaluation:  06/22/2015 Chief Complaint: I'm doing better History of Chief Complaint:   Chief Complaint  Patient presents with  . Depression  . Anxiety  . Follow-up    Depression        Past medical history includes anxiety.   Anxiety Symptoms include nervous/anxious behavior.     this patient is a 60 year old separated white female who lives alone in [place]. 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 She has one daughter and 2 grandchildren  The patient was referred by [facility] where she was hospitalized July 31 through August 6 after an intentional overdose on Ambien and oxycodone. The patient has had one prior overdose attempt in 2004. This occurred after she found that her first husband had had an affair with her sister years prior.  After the hospital patient 2004 the patient was followed for time by [facility] in [place]. She did fairly well on Paxil CR. Most recently she has been treated [facility] group primary  care and Abilify was added. Over the last year however she has lost many important people in her life. 2 of her aunt her best friend another friend and her dog all died within a short period of time. She was going downhill and getting more and more depressed and was dealing with that by drinking 2-3 ounces of liquor per day. She got increasingly despondent and eventually decided to take her life and wrote a suicide note to her family. Reportedly she  was found the next morning and brought to the ER in the ICU and consequently to the behavioral health hospital.  While there are Wellbutrin and trazodone were added. The patient wasn't sleeping well prior to hospitalization but she sleeping much better now. Her mood is improved but her energy and motivation are still poor and she's developed tremor in both hands predicted on the left. 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 It's hard for her to type because of the tremor and she can't remember anything. She's no longer suicidal but just doesn't have much energy to do much of anything right now. She's denies auditory visual sensations or paranoia. She is no longer drinking  The patient returns today after 3 months. She is generally doing well. She is enjoying her new house and being close to  her daughter and grandchildren. She is working 1 day a week. She claims that since she took the overdose she's had shaking in her hands and body that the clonazepam helps quite a bit. She no longer needs to take trazodone to sleep or hydroxyzine for anxiety. Her antidepressants are working well and she denies depressed mood or suicidal ideation. Her energy is good Review of Systems  Constitutional: Positive for activity change.  HENT: Negative.   Respiratory: Negative.   Cardiovascular: Negative.   Gastrointestinal: Negative.   Endocrine: Negative.   Genitourinary:  Negative.   Musculoskeletal: Positive for joint swelling and arthralgias.  Allergic/Immunologic: Negative.   Neurological: Positive for tremors.  Hematological: Negative.   Psychiatric/Behavioral: Positive for depression and dysphoric mood. The patient is nervous/anxious.    Physical Examnot done  Depressive Symptoms: depressed mood, anhedonia, insomnia, difficulty concentrating, anxiety, loss of energy/fatigue,  (Hypo) Manic Symptoms:   Elevated Mood:  No Irritable Mood:  No Grandiosity:  No Distractibility:  Yes Labiality of Mood:  Yes Delusions:  No Hallucinations:  No Impulsivity:  No Sexually Inappropriate Behavior:  No Financial Extravagance:  No Flight of Ideas:  No  Anxiety Symptoms: Excessive Worry:  Yes Panic Symptoms:  Yes Agoraphobia:  No Obsessive Compulsive: No  Symptoms: None, Specific Phobias:  No Social Anxiety:  Yes  Psychotic Symptoms:  Hallucinations: No None Delusions:  No Paranoia:  No   Ideas of Reference:  No  PTSD Symptoms: Ever had a traumatic exposure:  No Had a traumatic exposure in the last month:  No Re-experiencing: No None Hypervigilance:  No Hyperarousal: No None Avoidance: No None  Traumatic Brain Injury: No   Past Psychiatric History: Diagnosis: Maj. depression   Hospitalizations: In 2004 and again last month   Outpatient Care: At [facility] in the past   Substance Abuse Care: none  Self-Mutilation: none  Suicidal Attempts: Overdose attempts twice   Violent Behaviors:none   Past Medical History:   Past Medical History  Diagnosis Date  . Asthma   . Depression   . Reflux   . Chronic pain began in 2011 after jooint replacements   History of Loss of Consciousness:  Yes Seizure History:  No Cardiac History:  No Allergies:   Allergies  Allergen Reactions  . Sulfa Antibiotics Other (See Comments)    unknown   Current Medications:  Current Outpatient Prescriptions  Medication Sig Dispense Refill  .  albuterol (PROVENTIL HFA;VENTOLIN HFA) 108 (90 BASE) MCG/ACT inhaler Inhale 2 puffs into the lungs every 4 (four) hours as needed for wheezing or shortness of breath.    . ARIPiprazole (ABILIFY) 5 MG tablet Take 1 tablet (5 mg total) by mouth daily. For mood control 90 tablet 2  . buPROPion (WELLBUTRIN XL) 300 MG 24 hr tablet Take 1 tablet (300 mg total) by mouth every morning. 90 tablet 2  . clonazePAM (KLONOPIN) 1 MG tablet Take 1 tablet (1 mg total) by mouth daily. 30 tablet 2  . fluticasone-salmeterol (ADVAIR HFA) 115-21 MCG/ACT inhaler Inhale 2 puffs into the lungs 2 (two) times daily. For shortness of breath 1 Inhaler 12  . gabapentin (NEURONTIN) 300 MG capsule Take 1 capsule (300 mg total) by mouth 2 (two) times daily. For agitation/pain managment 180 capsule 2  . hydrOXYzine (ATARAX/VISTARIL) 25 MG tablet Take 1 tablet (25 mg) three times daily as needed: For anxiety 270 tablet 2  . ibuprofen (ADVIL,MOTRIN) 200 MG tablet Take 800 mg by mouth every 8 (eight) hours.    Marland Kitchen  pantoprazole (PROTONIX) 40 MG tablet Take 1 tablet (40 mg total) by mouth daily. For acid reflux    . PARoxetine (PAXIL-CR) 25 MG 24 hr tablet Take 1 tablet (25 mg total) by mouth daily. 90 tablet 2  . rOPINIRole (REQUIP) 0.5 MG tablet Take 1 tablet (0.5 mg total) by mouth at bedtime. For restless leg syndrome    . traZODone (DESYREL) 50 MG tablet Take 1 tablet (50 mg total) by mouth at bedtime as needed and may repeat dose one time if needed for sleep. 180 tablet 2   No current facility-administered medications for this visit.    Previous Psychotropic Medications:  Medication Dose   Paxil CR, Ambien                        Substance Abuse History in the last 12 months: Substance Age of 1st Use Last Use Amount Specific Type  Nicotine      Alcohol    was drinking 2-3 ounces of liquor per day until about 3 weeks ago    Cannabis      Opiates      Cocaine      Methamphetamines      LSD      Ecstasy       Benzodiazepines      Caffeine      Inhalants      Others:                          Medical Consequences of Substance Abuse: none  Legal Consequences of Substance Abuse: none  Family Consequences of Substance Abuse: none  Blackouts:  No DT's:  No Withdrawal Symptoms:  No None  Social History: Current Place of Residence: [place] [address] of Birth: [place] Family Members: Husband daughter 2 grandchildren Marital Status:  Married Children:   Sons:   Daughters: 1 Relationships: Education:  Corporate treasurer Problems/Performance:  Religious Beliefs/Practices: Christian History of Abuse: none Armed forces technical officer; 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 Legal History: none Hobbies/Interests: Animals gardening painting  Family History:   Family History  Problem Relation Age of Onset  . Deep vein thrombosis Neg Hx   . Pulmonary embolism Neg Hx   . Heart disease Neg Hx   . Heart failure Neg Hx   . Bipolar disorder Sister   . Alcohol abuse Maternal Grandfather     Mental Status Examination/Evaluation: Objective:  Appearance: Casual and Well Groomed    Eye Contact::  Good  Speech:  Clear and Coherent  Volume:  Decreased  Mood: Good   Affect: Bright   Thought Process:  Coherent  Orientation:  Full (Time, Place, and Person)  Thought Content:  Rumination  Suicidal Thoughts:  No  Homicidal Thoughts:  No  Judgement:  Good  Insight:  Good  Psychomotor Activity: Normal   Akathisia:  No  Handed:  Right  AIMS (if indicated):    Assets:  Communication Skills Desire for Improvement Resilience Social Support Talents/Skills    Laboratory/X-Ray Psychological Evaluation(s)   Reviewed in chart      Assessment:  Axis I: Major Depression, Recurrent severe  AXIS I Major Depression, Recurrent severe  AXIS II Deferred  AXIS III Past Medical History  Diagnosis Date  . Asthma   . Depression   . Reflux   . Chronic pain  began in 2011 after jooint replacements     AXIS IV other  psychosocial or environmental problems  AXIS V 51-60 moderate symptoms   Treatment Plan/Recommendations:  Plan of Care: Medication management   Laboratory:    Psychotherapy: She is seeing Florencia ReasonsPeggy Bynum here   Medications: She'll continue Wellbutrin and Paxil for depression and gabapentin for anxiety and Abilify for mood stabilization. She'll continue clonazepam to 1 mg daily for breakthrough anxiety.   Routine PRN Medications:  No  Consultations:   Safety Concerns:  She denies current thoughts of self-harm   Other: She'll return in 3 months    Timathy Newberry, Gavin PoundEBORAH, MD 5/19/201710:38 AM

## 2015-07-18 MED FILL — PAROXETINE CR 25 MG TABLET: 25 | 90 days supply | Qty: 90 | Fill #1

## 2015-07-18 MED FILL — ACYCLOVIR 200 MG CAPSULE: 200 | 5 days supply | Qty: 25 | Fill #1

## 2015-07-18 MED FILL — PANTOPRAZOLE SOD DR 40 MG T: 40 | 90 days supply | Qty: 90 | Fill #0

## 2015-08-02 MED FILL — clonazePAM 1 MG TABS: 1 | 30 days supply | Qty: 30 | Fill #0

## 2015-08-06 MED FILL — traMADol HCL 50 MG TABS: 50 | 90 days supply | Qty: 180 | Fill #0

## 2015-08-17 MED FILL — ARIPiprazole 5 MG TABS: 5 | 90 days supply | Qty: 90 | Fill #2

## 2015-08-20 DIAGNOSIS — Z79899 Other long term (current) drug therapy: Secondary | ICD-10-CM | POA: Diagnosis not present

## 2015-08-20 DIAGNOSIS — R8290 Unspecified abnormal findings in urine: Secondary | ICD-10-CM | POA: Diagnosis not present

## 2015-08-20 DIAGNOSIS — J4520 Mild intermittent asthma, uncomplicated: Secondary | ICD-10-CM | POA: Diagnosis not present

## 2015-08-20 DIAGNOSIS — M1990 Unspecified osteoarthritis, unspecified site: Secondary | ICD-10-CM | POA: Diagnosis not present

## 2015-08-20 DIAGNOSIS — E785 Hyperlipidemia, unspecified: Secondary | ICD-10-CM | POA: Diagnosis not present

## 2015-08-20 DIAGNOSIS — M545 Low back pain: Secondary | ICD-10-CM | POA: Diagnosis not present

## 2015-08-20 DIAGNOSIS — K219 Gastro-esophageal reflux disease without esophagitis: Secondary | ICD-10-CM | POA: Diagnosis not present

## 2015-08-20 DIAGNOSIS — Z791 Long term (current) use of non-steroidal anti-inflammatories (NSAID): Secondary | ICD-10-CM | POA: Diagnosis not present

## 2015-08-22 MED FILL — rOPINIRole HCL 0.5 MG TABS: 0.5 | 90 days supply | Qty: 90 | Fill #0

## 2015-08-23 ENCOUNTER — Other Ambulatory Visit: Payer: Self-pay | Admitting: Family Medicine

## 2015-08-23 DIAGNOSIS — Z1231 Encounter for screening mammogram for malignant neoplasm of breast: Secondary | ICD-10-CM

## 2015-08-23 MED FILL — BUPROPION HCL XL 300 MG TAB: 300 | 90 days supply | Qty: 90 | Fill #0

## 2015-08-28 ENCOUNTER — Ambulatory Visit
Admission: RE | Admit: 2015-08-28 | Discharge: 2015-08-28 | Disposition: A | Discharge status: Home / Self Care | Payer: 59 | Source: Ambulatory Visit | Attending: Family Medicine | Admitting: Family Medicine

## 2015-08-28 DIAGNOSIS — Z1231 Encounter for screening mammogram for malignant neoplasm of breast: Secondary | ICD-10-CM | POA: Diagnosis not present

## 2015-09-06 MED FILL — clonazePAM 1 MG TABS: 1 | 30 days supply | Qty: 30 | Fill #1

## 2015-09-07 DIAGNOSIS — R197 Diarrhea, unspecified: Secondary | ICD-10-CM | POA: Diagnosis not present

## 2015-09-07 MED FILL — DIPHENOXYLATE-ATROPINE TAB: 2.5-0.025 | 3 days supply | Qty: 30 | Fill #0

## 2015-09-11 MED FILL — ADVAIR 250/50 DISKUS: 250-50 | 90 days supply | Qty: 180 | Fill #0

## 2015-09-13 DIAGNOSIS — R197 Diarrhea, unspecified: Secondary | ICD-10-CM | POA: Diagnosis not present

## 2015-09-21 ENCOUNTER — Encounter (HOSPITAL_COMMUNITY): Payer: Self-pay | Admitting: Psychiatry

## 2015-09-21 ENCOUNTER — Ambulatory Visit (INDEPENDENT_AMBULATORY_CARE_PROVIDER_SITE_OTHER): Payer: 59 | Admitting: Psychiatry

## 2015-09-21 VITALS — BP 129/77 | HR 71 | Ht 64.0 in | Wt 248.6 lb

## 2015-09-21 DIAGNOSIS — F333 Major depressive disorder, recurrent, severe with psychotic symptoms: Secondary | ICD-10-CM | POA: Diagnosis not present

## 2015-09-21 MED ORDER — BUPROPION HCL ER (XL) 300 MG PO TB24: 300.0000 mg | ORAL_TABLET | ORAL | 2 refills | Status: DC

## 2015-09-21 MED ORDER — GABAPENTIN 300 MG PO CAPS: 300.0000 mg | ORAL_CAPSULE | Freq: Two times a day (BID) | ORAL | 2 refills | Status: DC

## 2015-09-21 MED ORDER — CLONAZEPAM 1 MG PO TABS: 1.0000 mg | ORAL_TABLET | Freq: Every day | ORAL | 2 refills | Status: DC

## 2015-09-21 MED ORDER — PAROXETINE HCL ER 37.5 MG PO TB24: 37.5000 mg | ORAL_TABLET | Freq: Every day | ORAL | 2 refills | Status: DC

## 2015-09-21 MED ORDER — ARIPIPRAZOLE 5 MG PO TABS: 5.0000 mg | ORAL_TABLET | Freq: Every day | ORAL | 2 refills | Status: DC

## 2015-09-21 NOTE — Progress Notes (Signed)
Patient ID: EILY LOUVIER, female   DOB: 1955-08-24, 60 y.o.   MRN: [id number] Patient ID: YOKO MCGAHEE, female   DOB: 08/13/1955, 60 y.o.   MRN: [id number] Patient ID: QUINTA EIMER, female   DOB: Apr 20, 1955, 60 y.o.   MRN: [id number] Patient ID: TEQULIA GONSALVES, female   DOB: [birth year], 60 y.o.   MRN: [id number] Patient ID: SHAKIMA NISLEY, female   DOB: [birth year], 60 y.o.   MRN: [id number] Patient ID: HUXLEY VANWAGONER, female   DOB: [birth year], 60 y.o.   MRN: [id number] Patient ID: MARKALA SITTS, female   DOB: 28-Jan-1956, 60 y.o.   MRN: [id number] Patient ID: TRIA NOGUERA, female   DOB: 1955-07-21, 60 y.o.   MRN: [id number] Patient ID: LEOMIA BLAKE, female   DOB: 1955/02/16, 60 y.o.   MRN: [id number] Patient ID: MAIZY DAVANZO, female   DOB: 07/15/1955, 60 y.o.   MRN: [id number]  Psychiatric Assessment Adult  Patient Identification:  Rebecca [name] Date of Evaluation:  09/21/2015 Chief Complaint: I'm doing better History of Chief Complaint:   No chief complaint on file.   Depression         Past medical history includes anxiety.   Anxiety  Symptoms include nervous/anxious behavior.     this patient is a 60 year old separated white female who lives alone in [place]. 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 She has one daughter and 2 grandchildren  The patient was referred by the [facility] where she was hospitalized July 31 through August 6 after an intentional overdose on Ambien and oxycodone. The patient has had one prior overdose attempt in 2004. This occurred after she found that her first husband had had an affair with her sister years prior.  After the hospital patient 2004 the patient was followed for time by [facility] in [place]. She did fairly well on Paxil CR. Most recently she has been treated [facility] group primary care and Abilify was added. Over the last year however  she has lost many important people in her life. 2 of her aunt her best friend another friend and her dog all died within a short period of time. She was going downhill and getting more and more depressed and was dealing with that by drinking 2-3 ounces of liquor per day. She got increasingly despondent and eventually decided to take her life and wrote a suicide note to her family. Reportedly she  was found the next morning and brought to the ER in the ICU and consequently to the behavioral health hospital.  While there are Wellbutrin and trazodone were added. The patient wasn't sleeping well prior to hospitalization but she sleeping much better now. Her mood is improved but her energy and motivation are still poor and she's developed tremor in both hands predicted on the left. 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 It's hard for her to type because of the tremor and she can't remember anything. She's no longer suicidal but just doesn't have much energy to do much of anything right now. She's denies auditory visual sensations or paranoia. She is no longer drinking  The patient returns today after 3 months. She states that for the last 5 weeks she's had chronic diarrhea. She's been to her primary doctor and had stool cultures and nothing  has shown. Lomotil is not helping. She has a referral to a GI physician. She's noticed that she is passing some of her pills in her stool and her mood has declined a bit. I suggested we increase her Paxil at least for now. She is still working part time and denies suicidal ideation. She is hopeful that the GI physician will be able to figure out what is wrong Review of Systems  Constitutional: Positive for activity change.  HENT: Negative.   Respiratory: Negative.   Cardiovascular: Negative.   Gastrointestinal: Negative.   Endocrine: Negative.   Genitourinary:  Negative.   Musculoskeletal: Positive for arthralgias and joint swelling.  Allergic/Immunologic: Negative.   Neurological: Positive for tremors.  Hematological: Negative.   Psychiatric/Behavioral: Positive for depression and dysphoric mood. The patient is nervous/anxious.    Physical Examnot done  Depressive Symptoms: depressed mood, anhedonia, insomnia, difficulty concentrating, anxiety, loss of energy/fatigue,  (Hypo) Manic Symptoms:   Elevated Mood:  No Irritable Mood:  No Grandiosity:  No Distractibility:  Yes Labiality of Mood:  Yes Delusions:  No Hallucinations:  No Impulsivity:  No Sexually Inappropriate Behavior:  No Financial Extravagance:  No Flight of Ideas:  No  Anxiety Symptoms: Excessive Worry:  Yes Panic Symptoms:  Yes Agoraphobia:  No Obsessive Compulsive: No  Symptoms: None, Specific Phobias:  No Social Anxiety:  Yes  Psychotic Symptoms:  Hallucinations: No None Delusions:  No Paranoia:  No   Ideas of Reference:  No  PTSD Symptoms: Ever had a traumatic exposure:  No Had a traumatic exposure in the last month:  No Re-experiencing: No None Hypervigilance:  No Hyperarousal: No None Avoidance: No None  Traumatic Brain Injury: No   Past Psychiatric History: Diagnosis: Maj. depression   Hospitalizations: In 2004 and again last month   Outpatient Care: At [facility] past   Substance Abuse Care: none  Self-Mutilation: none  Suicidal Attempts: Overdose attempts twice   Violent Behaviors:none   Past Medical History:   Past Medical History:  Diagnosis Date  . Asthma   . Chronic pain began in 2011 after jooint replacements  . Depression   . Reflux    History of Loss of Consciousness:  Yes Seizure History:  No Cardiac History:  No Allergies:   Allergies  Allergen Reactions  . Sulfa Antibiotics Other (See Comments)    unknown   Current Medications:  Current Outpatient Prescriptions  Medication Sig Dispense Refill  .  albuterol (PROVENTIL HFA;VENTOLIN HFA) 108 (90 BASE) MCG/ACT inhaler Inhale 2 puffs into the lungs every 4 (four) hours as needed for wheezing or shortness of breath.    . ARIPiprazole (ABILIFY) 5 MG tablet Take 1 tablet (5 mg total) by mouth daily. For mood control 90 tablet 2  . buPROPion (WELLBUTRIN XL) 300 MG 24 hr tablet Take 1 tablet (300 mg total) by mouth every morning. 90 tablet 2  . clonazePAM (KLONOPIN) 1 MG tablet Take 1 tablet (1 mg total) by mouth daily. 30 tablet 2  . fluticasone-salmeterol (ADVAIR HFA) 115-21 MCG/ACT inhaler Inhale 2 puffs into the lungs 2 (two) times daily. For shortness of breath 1 Inhaler 12  . gabapentin (NEURONTIN) 300 MG capsule Take 1 capsule (300 mg total) by mouth 2 (two) times daily. For agitation/pain managment 180 capsule 2  . ibuprofen (ADVIL,MOTRIN) 200 MG tablet Take 800 mg by mouth every 8 (eight) hours.    . pantoprazole (PROTONIX) 40 MG tablet Take 1 tablet (40 mg total) by mouth daily. For acid  reflux    . rOPINIRole (REQUIP) 0.5 MG tablet Take 1 tablet (0.5 mg total) by mouth at bedtime. For restless leg syndrome    . PARoxetine (PAXIL CR) 37.5 MG 24 hr tablet Take 1 tablet (37.5 mg total) by mouth daily. 30 tablet 2   No current facility-administered medications for this visit.     Previous Psychotropic Medications:  Medication Dose   Paxil CR, Ambien                        Substance Abuse History in the last 12 months: Substance Age of 1st Use Last Use Amount Specific Type  Nicotine      Alcohol    was drinking 2-3 ounces of liquor per day until about 3 weeks ago    Cannabis      Opiates      Cocaine      Methamphetamines      LSD      Ecstasy      Benzodiazepines      Caffeine      Inhalants      Others:                          Medical Consequences of Substance Abuse: none  Legal Consequences of Substance Abuse: none  Family Consequences of Substance Abuse: none  Blackouts:  No DT's:  No Withdrawal Symptoms:   No None  Social History: Current Place [place] Residence: [place] [address] [place] Place of Birth: [place] Family Members: Husband daughter 2 grandchildren Marital Status:  Married Children:   Sons:   Daughters: 1 Relationships: Education:  Corporate treasurerCollege Educational Problems/Performance:  Religious Beliefs/Practices: Christian History of Abuse: none Armed forces technical officerccupational Experiences; 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 Legal History: none Hobbies/Interests: Animals gardening painting  Family History:   Family History  Problem Relation Age of Onset  . Deep vein thrombosis Neg Hx   . Pulmonary embolism Neg Hx   . Heart disease Neg Hx   . Heart failure Neg Hx   . Bipolar disorder Sister   . Alcohol abuse Maternal Grandfather     Mental Status Examination/Evaluation: Objective:  Appearance: Casual and Well Groomed    Eye Contact::  Good  Speech:  Clear and Coherent  Volume:  Decreased  Mood: Fairly good   Affect: A little subdued   Thought Process:  Coherent  Orientation:  Full (Time, Place, and Person)  Thought Content:  Rumination  Suicidal Thoughts:  No  Homicidal Thoughts:  No  Judgement:  Good  Insight:  Good  Psychomotor Activity: Normal   Akathisia:  No  Handed:  Right  AIMS (if indicated):    Assets:  Communication Skills Desire for Improvement Resilience Social Support Talents/Skills    Laboratory/X-Ray Psychological Evaluation(s)   Reviewed in chart      Assessment:  Axis I: Major Depression, Recurrent severe  AXIS I Major Depression, Recurrent severe  AXIS II Deferred  AXIS III Past Medical History:  Diagnosis Date  . Asthma   . Chronic pain began in 2011 after jooint replacements  . Depression   . Reflux      AXIS IV other psychosocial or environmental problems  AXIS V 51-60 moderate symptoms   Treatment Plan/Recommendations:  Plan of Care: Medication management   Laboratory:    Psychotherapy: She is seeing Florencia ReasonsPeggy Bynum here    Medications: She'll continue Wellbutrin and Paxil for depressionBut  increase the Paxil CR to 37.5 mg daily and gabapentin for anxiety and Abilify for mood stabilization. She'll continue clonazepam to 1 mg daily for breakthrough anxiety.   Routine PRN Medications:  No  Consultations:   Safety Concerns:  She denies current thoughts of self-harm   Other: She'll return in 3 months    ROSS, Gavin PoundEBORAH, MD 8/18/201710:40 AM     Patient ID: Rebecca [name], female   DOB: 07/19/1955, 60 y.o.   MRN: [id number]

## 2015-09-27 DIAGNOSIS — K219 Gastro-esophageal reflux disease without esophagitis: Secondary | ICD-10-CM | POA: Diagnosis not present

## 2015-09-27 DIAGNOSIS — R197 Diarrhea, unspecified: Secondary | ICD-10-CM | POA: Diagnosis not present

## 2015-10-01 DIAGNOSIS — R197 Diarrhea, unspecified: Secondary | ICD-10-CM | POA: Diagnosis not present

## 2015-10-01 MED FILL — PAROXETINE CR 37.5 MG TAB: 37.5 | 30 days supply | Qty: 30 | Fill #0

## 2015-10-03 MED FILL — GAVILYTE-N SOLUTION: 420 | 1 days supply | Qty: 4000 | Fill #0

## 2015-10-05 DIAGNOSIS — R197 Diarrhea, unspecified: Secondary | ICD-10-CM | POA: Diagnosis not present

## 2015-10-05 DIAGNOSIS — K529 Noninfective gastroenteritis and colitis, unspecified: Secondary | ICD-10-CM | POA: Diagnosis not present

## 2015-10-11 MED FILL — clonazePAM 1 MG TABS: 1 | 30 days supply | Qty: 30 | Fill #0

## 2015-10-16 MED FILL — PANTOPRAZOLE SOD DR 40 MG T: 40 | 90 days supply | Qty: 90 | Fill #1

## 2015-10-16 MED FILL — GABAPENTIN 300 MG CAPSULE: 300 | 90 days supply | Qty: 180 | Fill #0

## 2015-10-30 MED FILL — UCERIS 9 MG ER TABLET: 9 | 42 days supply | Qty: 42 | Fill #0

## 2015-11-12 MED FILL — clonazePAM 1 MG TABS: 1 | 30 days supply | Qty: 30 | Fill #1

## 2015-11-12 MED FILL — traMADol HCL 50 MG TABS: 50 | 90 days supply | Qty: 180 | Fill #0

## 2015-11-19 MED FILL — ARIPiprazole 5 MG TABS: 5 | 90 days supply | Qty: 90 | Fill #0

## 2015-11-19 MED FILL — BUPROPION HCL XL 300 MG TAB: 300 | 90 days supply | Qty: 90 | Fill #1

## 2015-11-22 MED FILL — PAROXETINE CR 37.5 MG TAB: 37.5 | 30 days supply | Qty: 30 | Fill #1

## 2015-11-22 MED FILL — rOPINIRole HCL 0.5 MG TABS: 0.5 | 90 days supply | Qty: 90 | Fill #1

## 2015-12-01 ENCOUNTER — Emergency Department (HOSPITAL_COMMUNITY): Payer: 59

## 2015-12-01 ENCOUNTER — Inpatient Hospital Stay (HOSPITAL_COMMUNITY)
Admission: EM | Admit: 2015-12-01 | Discharge: 2015-12-04 | DRG: 193 | Disposition: A | Discharge status: Skilled Nursing Facility | Payer: 59 | Attending: Internal Medicine | Admitting: Internal Medicine

## 2015-12-01 ENCOUNTER — Encounter (HOSPITAL_COMMUNITY): Payer: Self-pay

## 2015-12-01 DIAGNOSIS — E66813 Obesity, class 3: Secondary | ICD-10-CM | POA: Diagnosis present

## 2015-12-01 DIAGNOSIS — J9601 Acute respiratory failure with hypoxia: Secondary | ICD-10-CM | POA: Diagnosis present

## 2015-12-01 DIAGNOSIS — Z882 Allergy status to sulfonamides status: Secondary | ICD-10-CM

## 2015-12-01 DIAGNOSIS — F17210 Nicotine dependence, cigarettes, uncomplicated: Secondary | ICD-10-CM | POA: Diagnosis present

## 2015-12-01 DIAGNOSIS — J181 Lobar pneumonia, unspecified organism: Secondary | ICD-10-CM | POA: Diagnosis not present

## 2015-12-01 DIAGNOSIS — R0602 Shortness of breath: Secondary | ICD-10-CM | POA: Diagnosis not present

## 2015-12-01 DIAGNOSIS — J4541 Moderate persistent asthma with (acute) exacerbation: Secondary | ICD-10-CM

## 2015-12-01 DIAGNOSIS — J189 Pneumonia, unspecified organism: Secondary | ICD-10-CM | POA: Diagnosis not present

## 2015-12-01 DIAGNOSIS — K219 Gastro-esophageal reflux disease without esophagitis: Secondary | ICD-10-CM | POA: Diagnosis present

## 2015-12-01 DIAGNOSIS — Z96652 Presence of left artificial knee joint: Secondary | ICD-10-CM | POA: Diagnosis present

## 2015-12-01 DIAGNOSIS — Z7951 Long term (current) use of inhaled steroids: Secondary | ICD-10-CM | POA: Diagnosis not present

## 2015-12-01 DIAGNOSIS — J441 Chronic obstructive pulmonary disease with (acute) exacerbation: Secondary | ICD-10-CM | POA: Diagnosis not present

## 2015-12-01 DIAGNOSIS — F329 Major depressive disorder, single episode, unspecified: Secondary | ICD-10-CM | POA: Diagnosis present

## 2015-12-01 DIAGNOSIS — J4521 Mild intermittent asthma with (acute) exacerbation: Secondary | ICD-10-CM | POA: Diagnosis not present

## 2015-12-01 DIAGNOSIS — J45901 Unspecified asthma with (acute) exacerbation: Secondary | ICD-10-CM | POA: Diagnosis present

## 2015-12-01 DIAGNOSIS — G2581 Restless legs syndrome: Secondary | ICD-10-CM | POA: Diagnosis present

## 2015-12-01 DIAGNOSIS — Z6841 Body Mass Index (BMI) 40.0 and over, adult: Secondary | ICD-10-CM | POA: Diagnosis not present

## 2015-12-01 DIAGNOSIS — G8929 Other chronic pain: Secondary | ICD-10-CM | POA: Diagnosis present

## 2015-12-01 DIAGNOSIS — R05 Cough: Secondary | ICD-10-CM | POA: Diagnosis not present

## 2015-12-01 DIAGNOSIS — R062 Wheezing: Secondary | ICD-10-CM | POA: Diagnosis not present

## 2015-12-01 LAB — CBC WITH DIFFERENTIAL/PLATELET
BASOS PCT: 1 %
Basophils Absolute: 0.1 10*3/uL (ref 0.0–0.1)
EOS ABS: 0.3 10*3/uL (ref 0.0–0.7)
EOS PCT: 3 %
HCT: 39.6 % (ref 36.0–46.0)
Hemoglobin: 13.2 g/dL (ref 12.0–15.0)
LYMPHS ABS: 1.2 10*3/uL (ref 0.7–4.0)
Lymphocytes Relative: 9 %
MCH: 30.3 pg (ref 26.0–34.0)
MCHC: 33.3 g/dL (ref 30.0–36.0)
MCV: 90.8 fL (ref 78.0–100.0)
MONOS PCT: 4 %
Monocytes Absolute: 0.5 10*3/uL (ref 0.1–1.0)
NEUTROS PCT: 83 %
Neutro Abs: 10.9 10*3/uL — ABNORMAL HIGH (ref 1.7–7.7)
PLATELETS: 387 10*3/uL (ref 150–400)
RBC: 4.36 MIL/uL (ref 3.87–5.11)
RDW: 14.3 % (ref 11.5–15.5)
WBC: 13.1 10*3/uL — AB (ref 4.0–10.5)

## 2015-12-01 LAB — BASIC METABOLIC PANEL
Anion gap: 8 (ref 5–15)
BUN: 13 mg/dL (ref 6–20)
CALCIUM: 8.6 mg/dL — AB (ref 8.9–10.3)
CHLORIDE: 106 mmol/L (ref 101–111)
CO2: 26 mmol/L (ref 22–32)
CREATININE: 0.74 mg/dL (ref 0.44–1.00)
GFR calc non Af Amer: >60 mL/min (ref >60)
Glucose, Bld: 99 mg/dL (ref 65–99)
Potassium: 3.6 mmol/L (ref 3.5–5.1)
SODIUM: 140 mmol/L (ref 135–145)

## 2015-12-01 LAB — PROCALCITONIN

## 2015-12-01 MED ORDER — BUPROPION HCL ER (XL) 150 MG PO TB24
ORAL_TABLET | ORAL | Status: AC
  Filled 2015-12-01: qty 2

## 2015-12-01 MED ORDER — ALBUTEROL SULFATE (2.5 MG/3ML) 0.083% IN NEBU: 2.5000 mg | INHALATION_SOLUTION | RESPIRATORY_TRACT | Status: DC | PRN

## 2015-12-01 MED ORDER — DEXTROSE 5 % IV SOLN
1.0000 g | Freq: Once | INTRAVENOUS | Status: AC
  Administered 2015-12-01: 1 g via INTRAVENOUS
  Filled 2015-12-01: qty 10

## 2015-12-01 MED ORDER — ARIPIPRAZOLE 10 MG PO TABS
5.0000 mg | ORAL_TABLET | Freq: Every day | ORAL | Status: DC
  Administered 2015-12-01 – 2015-12-04 (×4): 5 mg via ORAL
  Filled 2015-12-01 (×4): qty 1

## 2015-12-01 MED ORDER — CLONAZEPAM 0.5 MG PO TABS
1.0000 mg | ORAL_TABLET | Freq: Every day | ORAL | Status: DC
  Administered 2015-12-01 – 2015-12-03 (×3): 1 mg via ORAL
  Filled 2015-12-01 (×3): qty 2

## 2015-12-01 MED ORDER — SODIUM CHLORIDE 0.9 % IV BOLUS (SEPSIS)
1000.0000 mL | Freq: Once | INTRAVENOUS | Status: AC
  Administered 2015-12-01: 1000 mL via INTRAVENOUS

## 2015-12-01 MED ORDER — BUPROPION HCL ER (XL) 300 MG PO TB24
300.0000 mg | ORAL_TABLET | Freq: Every evening | ORAL | Status: DC
  Administered 2015-12-01 – 2015-12-03 (×3): 300 mg via ORAL
  Filled 2015-12-01 (×4): qty 1

## 2015-12-01 MED ORDER — PANTOPRAZOLE SODIUM 40 MG PO TBEC
40.0000 mg | DELAYED_RELEASE_TABLET | Freq: Every day | ORAL | Status: DC
  Administered 2015-12-02 – 2015-12-04 (×3): 40 mg via ORAL
  Filled 2015-12-01 (×3): qty 1

## 2015-12-01 MED ORDER — DEXTROSE 5 % IV SOLN
500.0000 mg | Freq: Once | INTRAVENOUS | Status: AC
  Administered 2015-12-01: 500 mg via INTRAVENOUS
  Filled 2015-12-01: qty 500

## 2015-12-01 MED ORDER — IPRATROPIUM-ALBUTEROL 0.5-2.5 (3) MG/3ML IN SOLN
3.0000 mL | Freq: Four times a day (QID) | RESPIRATORY_TRACT | Status: DC
  Administered 2015-12-01 – 2015-12-03 (×6): 3 mL via RESPIRATORY_TRACT
  Filled 2015-12-01 (×5): qty 3

## 2015-12-01 MED ORDER — ORAL CARE MOUTH RINSE
15.0000 mL | Freq: Two times a day (BID) | OROMUCOSAL | Status: DC
  Administered 2015-12-02 – 2015-12-04 (×5): 15 mL via OROMUCOSAL

## 2015-12-01 MED ORDER — ALBUTEROL (5 MG/ML) CONTINUOUS INHALATION SOLN
10.0000 mg/h | INHALATION_SOLUTION | Freq: Once | RESPIRATORY_TRACT | Status: AC
  Administered 2015-12-01: 10 mg/h via RESPIRATORY_TRACT
  Filled 2015-12-01: qty 20

## 2015-12-01 MED ORDER — AZITHROMYCIN 500 MG IV SOLR
500.0000 mg | Freq: Once | INTRAVENOUS | Status: DC
  Filled 2015-12-01: qty 500

## 2015-12-01 MED ORDER — TRAMADOL HCL 50 MG PO TABS
50.0000 mg | ORAL_TABLET | Freq: Two times a day (BID) | ORAL | Status: DC
  Administered 2015-12-01 – 2015-12-04 (×6): 50 mg via ORAL
  Filled 2015-12-01 (×6): qty 1

## 2015-12-01 MED ORDER — DEXTROSE 5 % IV SOLN
1.0000 g | INTRAVENOUS | Status: DC
  Administered 2015-12-02 – 2015-12-03 (×2): 1 g via INTRAVENOUS
  Filled 2015-12-01 (×3): qty 10

## 2015-12-01 MED ORDER — GUAIFENESIN ER 600 MG PO TB12
600.0000 mg | ORAL_TABLET | Freq: Two times a day (BID) | ORAL | Status: DC
  Administered 2015-12-01 – 2015-12-04 (×6): 600 mg via ORAL
  Filled 2015-12-01 (×6): qty 1

## 2015-12-01 MED ORDER — METHYLPREDNISOLONE SODIUM SUCC 125 MG IJ SOLR
60.0000 mg | Freq: Three times a day (TID) | INTRAMUSCULAR | Status: DC
Start: 2015-12-01 — End: 2015-12-04
  Administered 2015-12-01 – 2015-12-04 (×8): 60 mg via INTRAVENOUS
  Filled 2015-12-01 (×8): qty 2

## 2015-12-01 MED ORDER — LIDOCAINE 5 % EX PTCH: 1.0000 | MEDICATED_PATCH | Freq: Every day | CUTANEOUS | Status: DC | PRN

## 2015-12-01 MED ORDER — IPRATROPIUM-ALBUTEROL 0.5-2.5 (3) MG/3ML IN SOLN
3.0000 mL | Freq: Once | RESPIRATORY_TRACT | Status: AC
  Administered 2015-12-01: 3 mL via RESPIRATORY_TRACT
  Filled 2015-12-01: qty 3

## 2015-12-01 MED ORDER — MOMETASONE FURO-FORMOTEROL FUM 200-5 MCG/ACT IN AERO
2.0000 | INHALATION_SPRAY | Freq: Two times a day (BID) | RESPIRATORY_TRACT | Status: DC
  Administered 2015-12-01 – 2015-12-04 (×6): 2 via RESPIRATORY_TRACT
  Filled 2015-12-01: qty 8.8

## 2015-12-01 MED ORDER — ENOXAPARIN SODIUM 40 MG/0.4ML ~~LOC~~ SOLN
40.0000 mg | SUBCUTANEOUS | Status: DC
  Administered 2015-12-01 – 2015-12-03 (×3): 40 mg via SUBCUTANEOUS
  Filled 2015-12-01 (×3): qty 0.4

## 2015-12-01 MED ORDER — ROPINIROLE HCL 1 MG PO TABS
0.5000 mg | ORAL_TABLET | Freq: Every day | ORAL | Status: DC
  Administered 2015-12-01 – 2015-12-03 (×3): 0.5 mg via ORAL
  Filled 2015-12-01 (×3): qty 1

## 2015-12-01 MED ORDER — DEXTROSE 5 % IV SOLN
500.0000 mg | INTRAVENOUS | Status: DC
  Administered 2015-12-02 – 2015-12-03 (×2): 500 mg via INTRAVENOUS
  Filled 2015-12-01 (×3): qty 500

## 2015-12-01 MED ORDER — GABAPENTIN 300 MG PO CAPS
300.0000 mg | ORAL_CAPSULE | Freq: Two times a day (BID) | ORAL | Status: DC
  Administered 2015-12-01 – 2015-12-04 (×6): 300 mg via ORAL
  Filled 2015-12-01 (×6): qty 1

## 2015-12-01 MED ORDER — PAROXETINE HCL ER 37.5 MG PO TB24
37.5000 mg | ORAL_TABLET | Freq: Every evening | ORAL | Status: DC
  Administered 2015-12-02 – 2015-12-03 (×2): 37.5 mg via ORAL
  Filled 2015-12-01 (×4): qty 1

## 2015-12-01 MED ORDER — IBUPROFEN 800 MG PO TABS
800.0000 mg | ORAL_TABLET | Freq: Three times a day (TID) | ORAL | Status: DC
  Administered 2015-12-01 – 2015-12-02 (×2): 800 mg via ORAL
  Filled 2015-12-01 (×2): qty 1

## 2015-12-01 NOTE — ED Notes (Signed)
EDPA asked to pause IV antibiotics due to needing BC's done.  Will resume once BC's are drawn by lab

## 2015-12-01 NOTE — ED Provider Notes (Signed)
[facility]-EM[facility]ENCY DEPT Provider Note   CSN: [id number] Arrival date & time: 12/01/15  1610     History   Chief Complaint Chief Complaint  Patient presents with  . Shortness of Breath    HPI Rebecca [name] is a 60 y.o. female.  HPI   Rebecca [name] is a 60 y.o. female, with a history of Asthma, presenting to the ED with Shortness of breath and productive cough with gray sputum for the last 3 days. Patient endorses nasal congestion prior to her current symptoms. Patient has been using her home albuterol nebulizer once in the last 12 hours. She was also seen at an urgent care prior to arrival in the ED and treated with 1 DuoNeb and 125 mg Solu-Medrol IM. Patient states she has not received relief in her symptoms from either her home or urgent care treatment. Has had to be hospitalized for asthma in the past, but never ICU or intubation. Current half ppd smoker. Denies fever/chills, nausea/vomiting, chest pain, or any other complaints. Further denies history of recent travel, hospitalization, trauma, PE/DVT, or unilateral leg swelling.    Past Medical History:  Diagnosis Date  . Asthma   . Chronic pain began in 2011 after jooint replacements  . Depression   . Reflux     Patient Active Problem List   Diagnosis Date Noted  . CAP (community acquired pneumonia) 12/01/2015  . Acute respiratory failure with hypoxia (HCC) 12/01/2015  . Acute asthma exacerbation 12/01/2015  . Lower extremity edema 09/06/2013  . Orthopnea 09/06/2013  . MDD (major depressive disorder) 09/04/2013  . Morbid obesity (HCC) 09/03/2013  . Drug overdose, intentional (HCC) 09/02/2013  . Intentional drug overdose (HCC) 09/02/2013  . Knee pain 09/12/2010  . Difficulty in walking(719.7) 09/12/2010  . Stiffness of joint, not elsewhere classified, lower leg 09/12/2010  . S/P total knee replacement 09/12/2010  . Asthma, chronic     Past Surgical History:  Procedure Laterality Date  . FOOT SURGERY  2011  -both feet  . KNEE SURGERY     left knee  . SHOULDER SURGERY  2011    OB History    No data available       Home Medications    Prior to Admission medications   Medication Sig Start Date End Date Taking? Authorizing Provider  albuterol (PROVENTIL HFA;VENTOLIN HFA) 108 (90 BASE) MCG/ACT inhaler Inhale 2 puffs into the lungs every 4 (four) hours as needed for wheezing or shortness of breath. 09/08/13  Yes Sanjuana KavaAgnes I Nwoko, NP  albuterol (PROVENTIL) (2.5 MG/3ML) 0.083% nebulizer solution Take 2.5 mg by nebulization every 4 (four) hours as needed for wheezing or shortness of breath.   Yes Historical Provider, MD  ARIPiprazole (ABILIFY) 5 MG tablet Take 1 tablet (5 mg total) by mouth daily. For mood control 09/21/15  Yes Myrlene Brokereborah R Ross, MD  Budesonide (UCERIS) 9 MG TB24 Take 1 tablet by mouth daily.   Yes Historical Provider, MD  buPROPion (WELLBUTRIN XL) 300 MG 24 hr tablet Take 1 tablet (300 mg total) by mouth every morning. Patient taking differently: Take 300 mg by mouth every evening.  09/21/15 09/20/16 Yes Myrlene Brokereborah R Ross, MD  clonazePAM (KLONOPIN) 1 MG tablet Take 1 tablet (1 mg total) by mouth daily. Patient taking differently: Take 1 mg by mouth at bedtime.  09/21/15 09/20/16 Yes Myrlene Brokereborah R Ross, MD  Fluticasone-Salmeterol (ADVAIR) 250-50 MCG/DOSE AEPB Inhale 1 puff into the lungs 2 (two) times daily.   Yes Historical Provider, MD  gabapentin (  NEURONTIN) 300 MG capsule Take 1 capsule (300 mg total) by mouth 2 (two) times daily. For agitation/pain managment 09/21/15  Yes Myrlene Brokereborah R Ross, MD  ibuprofen (ADVIL,MOTRIN) 200 MG tablet Take 800 mg by mouth every 8 (eight) hours. 09/08/13  Yes Sanjuana KavaAgnes I Nwoko, NP  lidocaine (LIDODERM) 5 % Place 1 patch onto the skin daily as needed (for pain). Remove & Discard patch within 12 hours or as directed by MD   Yes Historical Provider, MD  pantoprazole (PROTONIX) 40 MG tablet Take 1 tablet (40 mg total) by mouth daily. For acid reflux 09/08/13  Yes Sanjuana KavaAgnes I Nwoko, NP    PARoxetine (PAXIL CR) 37.5 MG 24 hr tablet Take 1 tablet (37.5 mg total) by mouth daily. Patient taking differently: Take 37.5 mg by mouth every evening.  09/21/15 09/20/16 Yes Myrlene Brokereborah R Ross, MD  rOPINIRole (REQUIP) 0.5 MG tablet Take 1 tablet (0.5 mg total) by mouth at bedtime. For restless leg syndrome 09/08/13  Yes Sanjuana KavaAgnes I Nwoko, NP  traMADol (ULTRAM) 50 MG tablet Take 50 mg by mouth 2 (two) times daily.   Yes Historical Provider, MD    Family History Family History  Problem Relation Age of Onset  . Bipolar disorder Sister   . Alcohol abuse Maternal Grandfather   . Deep vein thrombosis Neg Hx   . Pulmonary embolism Neg Hx   . Heart disease Neg Hx   . Heart failure Neg Hx     Social History Social History  Substance Use Topics  . Smoking status: Current Every Day Smoker    Types: Cigarettes    Last attempt to quit: 09/19/2009  . Smokeless tobacco: Never Used  . Alcohol use No     Allergies   Sulfa antibiotics   Review of Systems Review of Systems  Constitutional: Negative for chills and fever.  Respiratory: Positive for cough and shortness of breath.   Cardiovascular: Negative for chest pain.  Gastrointestinal: Negative for nausea and vomiting.  All other systems reviewed and are negative.    Physical Exam Updated Vital Signs BP 133/76 (BP Location: Right Arm)   Pulse 70   Temp 97.7 F (36.5 C) (Oral)   Resp 19   Ht 5\' 3"  (1.6 m)   Wt 109.3 kg   SpO2 96%   BMI 42.69 kg/m   Physical Exam  Constitutional: She appears well-developed and well-nourished. No distress.  HENT:  Head: Normocephalic and atraumatic.  Eyes: Conjunctivae are normal.  Neck: Neck supple.  Cardiovascular: Normal rate, regular rhythm, normal heart sounds and intact distal pulses.   Pulmonary/Chest: She has wheezes in the right upper field, the right middle field, the right lower field, the left upper field, the left middle field and the left lower field.  Increased work of breath, but  speaks in full sentences.   Abdominal: Soft. There is no tenderness. There is no guarding.  Musculoskeletal: She exhibits no edema or tenderness.  Lymphadenopathy:    She has no cervical adenopathy.  Neurological: She is alert.  Skin: Skin is warm and dry. She is not diaphoretic.  Psychiatric: She has a normal mood and affect. Her behavior is normal.  Nursing note and vitals reviewed.    ED Treatments / Results  Labs (all labs ordered are listed, but only abnormal results are displayed) Labs Reviewed  BASIC METABOLIC PANEL - Abnormal; Notable for the following:       Result Value   Calcium 8.6 (*)    All other components within normal  limits  CBC WITH DIFFERENTIAL/PLATELET - Abnormal; Notable for the following:    WBC 13.1 (*)    Neutro Abs 10.9 (*)    All other components within normal limits  CULTURE, BLOOD (ROUTINE X 2)  CULTURE, BLOOD (ROUTINE X 2)  PROCALCITONIN    EKG  EKG Interpretation  Date/Time:  Saturday December 01 2015 16:21:28 EDT Ventricular Rate:  74 PR Interval:    QRS Duration: 92 QT Interval:  405 QTC Calculation: 450 R Axis:   52 Text Interpretation:  Sinus rhythm Non-specific ST-t changes Confirmed by Juleen China  MD, STEPHEN [phone]) on 12/01/2015 5:06:30 PM       Radiology Dg Chest Portable 1 View  Result Date: 12/01/2015 CLINICAL DATA:  Shortness of breath, productive cough. EXAM: PORTABLE CHEST 1 VIEW COMPARISON:  Radiographs of September 07, 2013. FINDINGS: The heart size and mediastinal contours are within normal limits. No pneumothorax or pleural effusion is noted. Right lung is clear. Left apical airspace opacity is noted concerning for possible pneumonia. The visualized skeletal structures are unremarkable. IMPRESSION: Left apical opacity is noted concerning for possible pneumonia. Follow-up PA and lateral radiographs in 3-4 weeks after antibiotic therapy trial is recommended to ensure resolution and rule out neoplasm. Electronically Signed   By:  Lupita Raider, M.D.   On: 12/01/2015 17:08    Procedures Procedures (including critical care time)  Medications Ordered in ED Medications  azithromycin (ZITHROMAX) 500 mg in dextrose 5 % 250 mL IVPB (not administered)  ipratropium-albuterol (DUONEB) 0.5-2.5 (3) MG/3ML nebulizer solution 3 mL (3 mLs Nebulization Given 12/01/15 1644)  albuterol (PROVENTIL,VENTOLIN) solution continuous neb (10 mg/hr Nebulization Given 12/01/15 1712)  sodium chloride 0.9 % bolus 1,000 mL (1,000 mLs Intravenous New Bag/Given 12/01/15 1744)  cefTRIAXone (ROCEPHIN) 1 g in dextrose 5 % 50 mL IVPB (0 g Intravenous Paused 12/01/15 1839)     Initial Impression / Assessment and Plan / ED Course  I have reviewed the triage vital signs and the nursing notes.  Pertinent labs & imaging results that were available during my care of the patient were reviewed by me and considered in my medical decision making (see chart for details).  Clinical Course    Patient presents for shortness of breath and productive cough for the last 3 days. Suspect asthma exacerbation with possible pneumonia. Upon reassessment following additional albuterol, patient continues to have wheezing in all fields. Increased work of breathing persists. Patient's chest x-ray shows left lower lung opacity concerning for pneumonia.  Upon ambulation, patient shows significant increased work of breathing, reduction in speech to one and 2 word phrases, SPO2 drops to 82% on room air, and patient became tachypneic at least 30 breaths per minute. Oxygen therapy required to return the patient to an adequate SPO2. 2 L/m brought the patient back to 97%.  Findings and plan of care discussed with Raeford Razor, MD. Dr. Juleen China personally evaluated and examined this patient.  6:37 PM spoke with Dr. Adrian Blackwater, Hospitalist, who agreed to admit the patient to MedSurg with a full admission. Requested blood cultures.   Vitals:   12/01/15 1615 12/01/15 1616 12/01/15 1622  12/01/15 1644  BP:  (!) 143/103 133/76   Pulse:  73 70   Resp:  22 19   Temp:  97.7 F (36.5 C)    TempSrc:  Oral    SpO2:  94% 96% 94%  Weight: 109.3 kg     Height: 5\' 3"  (1.6 m)      Vitals:   12/01/15  1730 12/01/15 1800 12/01/15 1830 12/01/15 1845  BP: 124/71 125/70 117/66   Pulse: 77 89 91 92  Resp: (!) 30 20 19 23   Temp:      TempSrc:      SpO2: 95% 100% 96% 96%  Weight:      Height:        Final Clinical Impressions(s) / ED Diagnoses   Final diagnoses:  Community acquired pneumonia of left upper lobe of lung [facility])    New Prescriptions New Prescriptions   No medications on file     Concepcion Living 12/01/15 1928    Raeford Razor, MD 12/10/15 1106

## 2015-12-01 NOTE — ED Notes (Signed)
Reported that pt's RA sats dropped to upper 80's when she ambulated back to bed from BR.  O2 applied via [place] at 2 L/M.  sats increased to 96-97% with O2 on

## 2015-12-01 NOTE — ED Triage Notes (Signed)
Pt reports sob x 3 days and productive cough.  Denies pain or fever.  Pt went to urgent care today for wheezing and SOB.  Staff reports pt received albuterol and atrovent neb and solumedrol 125mg  IM but no relief.

## 2015-12-01 NOTE — H&P (Signed)
History and Physical  Rebecca [name] ZOX:[id number] DOB: Aug 14, 1955 DOA: 12/01/2015  Referring physician: Harolyn Rutherford, PA-C, ED provider PCP: Cain Saupe, MD  Outpatient Specialists:   Dr. Tenny Craw - psychiatry  Chief Complaint: SOB, cough  HPI: Rebecca [name] is a 60 y.o. female with a history of asthma, chronic pain, depression, reflux, restless leg syndrome. Patient seen for 7 days worsening cough with shortness of breath that is acutely worse over the past 2-3 days. Shortness of breath worse with ambulation and improved with rest. Has productive loose cough. Shortness of breath was worse to the point where she was dyspneic to a few feet. She went to walk-in care and [facility] today and received a nebulizer treatment, Atrovent neb, and Solu-Medrol there. She came to the emergency department here for further care due to no improvement.  Course in ED: Patient received an hour-long neb with improvement. However she desats to 82% on room air when ambulating. She does have white count of 13 and a right upper lobe infiltrates.   Review of Systems:   Pt denies any fevers, chills, nausea, vomiting, diarrhea, constipation, abdominal pain, orthopnea, palpitations, headache, vision changes, lightheadedness, dizziness, melena, rectal bleeding.  Review of systems are otherwise negative  Past Medical History:  Diagnosis Date  . Asthma   . Chronic pain began in 2011 after jooint replacements  . Depression   . Reflux    Past Surgical History:  Procedure Laterality Date  . FOOT SURGERY  2011 -both feet  . KNEE SURGERY     left knee  . SHOULDER SURGERY  2011   Social History:  reports that she has been smoking Cigarettes.  She has never used smokeless tobacco. She reports that she does not drink alcohol or use drugs. Patient lives at Home  Allergies  Allergen Reactions  . Sulfa Antibiotics Other (See Comments)    unknown    Family History  Problem Relation Age of Onset  . Bipolar  disorder Sister   . Alcohol abuse Maternal Grandfather   . Deep vein thrombosis Neg Hx   . Pulmonary embolism Neg Hx   . Heart disease Neg Hx   . Heart failure Neg Hx      Prior to Admission medications   Medication Sig Start Date End Date Taking? Authorizing Provider  albuterol (PROVENTIL HFA;VENTOLIN HFA) 108 (90 BASE) MCG/ACT inhaler Inhale 2 puffs into the lungs every 4 (four) hours as needed for wheezing or shortness of breath. 09/08/13  Yes Sanjuana Kava, NP  albuterol (PROVENTIL) (2.5 MG/3ML) 0.083% nebulizer solution Take 2.5 mg by nebulization every 4 (four) hours as needed for wheezing or shortness of breath.   Yes Historical Provider, MD  ARIPiprazole (ABILIFY) 5 MG tablet Take 1 tablet (5 mg total) by mouth daily. For mood control 09/21/15  Yes Myrlene Broker, MD  Budesonide (UCERIS) 9 MG TB24 Take 1 tablet by mouth daily.   Yes Historical Provider, MD  buPROPion (WELLBUTRIN XL) 300 MG 24 hr tablet Take 1 tablet (300 mg total) by mouth every morning. Patient taking differently: Take 300 mg by mouth every evening.  09/21/15 09/20/16 Yes Myrlene Broker, MD  clonazePAM (KLONOPIN) 1 MG tablet Take 1 tablet (1 mg total) by mouth daily. Patient taking differently: Take 1 mg by mouth at bedtime.  09/21/15 09/20/16 Yes Myrlene Broker, MD  Fluticasone-Salmeterol (ADVAIR) 250-50 MCG/DOSE AEPB Inhale 1 puff into the lungs 2 (two) times daily.   Yes Historical Provider, MD  gabapentin (NEURONTIN)  300 MG capsule Take 1 capsule (300 mg total) by mouth 2 (two) times daily. For agitation/pain managment 09/21/15  Yes Myrlene Brokereborah R Ross, MD  ibuprofen (ADVIL,MOTRIN) 200 MG tablet Take 800 mg by mouth every 8 (eight) hours. 09/08/13  Yes Sanjuana KavaAgnes I Nwoko, NP  lidocaine (LIDODERM) 5 % Place 1 patch onto the skin daily as needed (for pain). Remove & Discard patch within 12 hours or as directed by MD   Yes Historical Provider, MD  pantoprazole (PROTONIX) 40 MG tablet Take 1 tablet (40 mg total) by mouth daily. For acid  reflux 09/08/13  Yes Sanjuana KavaAgnes I Nwoko, NP  PARoxetine (PAXIL CR) 37.5 MG 24 hr tablet Take 1 tablet (37.5 mg total) by mouth daily. Patient taking differently: Take 37.5 mg by mouth every evening.  09/21/15 09/20/16 Yes Myrlene Brokereborah R Ross, MD  rOPINIRole (REQUIP) 0.5 MG tablet Take 1 tablet (0.5 mg total) by mouth at bedtime. For restless leg syndrome 09/08/13  Yes Sanjuana KavaAgnes I Nwoko, NP  traMADol (ULTRAM) 50 MG tablet Take 50 mg by mouth 2 (two) times daily.   Yes Historical Provider, MD    Physical Exam: BP 117/66   Pulse 92   Temp 97.7 F (36.5 C) (Oral)   Resp 23   Ht 5\' 3"  (1.6 m)   Wt 109.3 kg (241 lb)   SpO2 96%   BMI 42.69 kg/m   General: Middle-aged Caucasian female. Awake and alert and oriented x3. No acute cardiopulmonary distress.  HEENT: Normocephalic atraumatic.  Right and left ears normal in appearance.  Pupils equal, round, reactive to light. Extraocular muscles are intact. Sclerae anicteric and noninjected.  Moist mucosal membranes. No mucosal lesions.  Neck: Neck supple without lymphadenopathy. No carotid bruits. No masses palpated.  Cardiovascular: Regular rate with normal S1-S2 sounds. No murmurs, rubs, gallops auscultated. No JVD.  Respiratory: Coarse expiratory wheezes throughout. Rales right upper lobe. Prolonged exhalation phase. Abdomen: Obese. Soft, nontender, nondistended. Active bowel sounds. No masses or hepatosplenomegaly  Skin: No rashes, lesions, or ulcerations.  Dry, warm to touch. 2+ dorsalis pedis and radial pulses. Musculoskeletal: No calf or leg pain. All major joints not erythematous nontender.  No upper or lower joint deformation.  Good ROM.  No contractures  Psychiatric: Intact judgment and insight. Pleasant and cooperative. Neurologic: No focal neurological deficits. Strength is 5/5 and symmetric in upper and lower extremities.  Cranial nerves II through XII are grossly intact.           Labs on Admission: I have personally reviewed following labs and imaging  studies  CBC:  Recent Labs Lab 12/01/15 1626  WBC 13.1*  NEUTROABS 10.9*  HGB 13.2  HCT 39.6  MCV 90.8  PLT 387   Basic Metabolic Panel:  Recent Labs Lab 12/01/15 1626  NA 140  K 3.6  CL 106  CO2 26  GLUCOSE 99  BUN 13  CREATININE 0.74  CALCIUM 8.6*   GFR: Estimated Creatinine Clearance: 89.9 mL/min (by C-G formula based on SCr of 0.74 mg/dL). Liver Function Tests: No results for input(s): AST, ALT, ALKPHOS, BILITOT, PROT, ALBUMIN in the last 168 hours. No results for input(s): LIPASE, AMYLASE in the last 168 hours. No results for input(s): AMMONIA in the last 168 hours. Coagulation Profile: No results for input(s): INR, PROTIME in the last 168 hours. Cardiac Enzymes: No results for input(s): CKTOTAL, CKMB, CKMBINDEX, TROPONINI in the last 168 hours. BNP (last 3 results) No results for input(s): PROBNP in the last 8760 hours. HbA1C: No results  for input(s): HGBA1C in the last 72 hours. CBG: No results for input(s): GLUCAP in the last 168 hours. Lipid Profile: No results for input(s): CHOL, HDL, LDLCALC, TRIG, CHOLHDL, LDLDIRECT in the last 72 hours. Thyroid Function Tests: No results for input(s): TSH, T4TOTAL, FREET4, T3FREE, THYROIDAB in the last 72 hours. Anemia Panel: No results for input(s): VITAMINB12, FOLATE, FERRITIN, TIBC, IRON, RETICCTPCT in the last 72 hours. Urine analysis:    Component Value Date/Time   COLORURINE YELLOW 09/07/2013 0000   APPEARANCEUR CLEAR 09/07/2013 0000   LABSPEC 1.013 09/07/2013 0000   PHURINE 7.0 09/07/2013 0000   GLUCOSEU NEGATIVE 09/07/2013 0000   HGBUR NEGATIVE 09/07/2013 0000   BILIRUBINUR NEGATIVE 09/07/2013 0000   KETONESUR NEGATIVE 09/07/2013 0000   PROTEINUR NEGATIVE 09/07/2013 0000   UROBILINOGEN 0.2 09/07/2013 0000   NITRITE NEGATIVE 09/07/2013 0000   LEUKOCYTESUR NEGATIVE 09/07/2013 0000   Sepsis Labs: @LABRCNTIP (procalcitonin:4,lacticidven:4) )No results found for this or any previous visit (from the  past 240 hour(s)).   Radiological Exams on Admission: Dg Chest Portable 1 View  Result Date: 12/01/2015 CLINICAL DATA:  Shortness of breath, productive cough. EXAM: PORTABLE CHEST 1 VIEW COMPARISON:  Radiographs of September 07, 2013. FINDINGS: The heart size and mediastinal contours are within normal limits. No pneumothorax or pleural effusion is noted. Right lung is clear. Left apical airspace opacity is noted concerning for possible pneumonia. The visualized skeletal structures are unremarkable. IMPRESSION: Left apical opacity is noted concerning for possible pneumonia. Follow-up PA and lateral radiographs in 3-4 weeks after antibiotic therapy trial is recommended to ensure resolution and rule out neoplasm. Electronically Signed   By: Lupita RaiderJames  Green Jr, M.D.   On: 12/01/2015 17:08    EKG: Independently reviewed. Sinus rhythm. Nonspecific ST changes. No acute ST elevation or depression.  Assessment/Plan: Principal Problem:   Acute respiratory failure with hypoxia (HCC) Active Problems:   Morbid obesity (HCC)   CAP (community acquired pneumonia)   Acute asthma exacerbation    This patient was discussed with the ED physician, including pertinent vitals, physical exam findings, labs, and imaging.  We also discussed care given by the ED provider.  #1 acute respiratory failure with hypoxia  Admits to MedSurg  Continuous pulse oximetry  O2 via nasal cannula #2 community acquired pneumonia  Antibiotics: Rocephin and Zithromax  Blood cultures obtained  Sputum cultures  CBC tomorrow  Mucinex  Strep urine antigen #3 acute asthma exacerbation  DuoNeb's every 6 scheduled with albuterol every 2 when necessary  Continue inhaled steroids and LA bronchodilator  Solu-Medrol 60 mg IV every 8 hours  Mucinex #4 orbit obesity #5 chronic pain  Continue home medications  DVT prophylaxis: Lovenox Consultants: None Code Status: Full code Family Communication: Friend in the room    Disposition Plan: Patient should be able to return home following admission   Levie HeritageJacob J Pamela Intrieri, DO [facility] Pager [phone]  If 7PM-7AM, please contact night-coverage [email or web address] Password [email or web address]

## 2015-12-01 NOTE — Discharge Instructions (Signed)
Present evidence of pneumonia on your chest x-ray. This is likely the cause of your asthma exacerbation.   Follow-up with your primary care doctor next week to assure resolution of symptoms. It is recommended that you receive another chest x-ray in 3-4 weeks to recheck the lungs.

## 2015-12-02 DIAGNOSIS — J189 Pneumonia, unspecified organism: Principal | ICD-10-CM

## 2015-12-02 DIAGNOSIS — J4521 Mild intermittent asthma with (acute) exacerbation: Secondary | ICD-10-CM

## 2015-12-02 DIAGNOSIS — J9601 Acute respiratory failure with hypoxia: Secondary | ICD-10-CM

## 2015-12-02 LAB — EXPECTORATED SPUTUM ASSESSMENT W REFEX TO RESP CULTURE

## 2015-12-02 LAB — CBC
HCT: 37.1 % (ref 36.0–46.0)
Hemoglobin: 12.5 g/dL (ref 12.0–15.0)
MCH: 30.3 pg (ref 26.0–34.0)
MCHC: 33.7 g/dL (ref 30.0–36.0)
MCV: 90 fL (ref 78.0–100.0)
PLATELETS: 404 10*3/uL — AB (ref 150–400)
RBC: 4.12 MIL/uL (ref 3.87–5.11)
RDW: 14.4 % (ref 11.5–15.5)
WBC: 12 10*3/uL — ABNORMAL HIGH (ref 4.0–10.5)

## 2015-12-02 MED ORDER — ACETAMINOPHEN 325 MG PO TABS
650.0000 mg | ORAL_TABLET | ORAL | Status: DC | PRN
  Administered 2015-12-02 – 2015-12-04 (×4): 650 mg via ORAL
  Filled 2015-12-02 (×4): qty 2

## 2015-12-02 MED ORDER — GUAIFENESIN-CODEINE 100-10 MG/5ML PO SOLN
5.0000 mL | ORAL | Status: DC | PRN
  Administered 2015-12-02 – 2015-12-03 (×4): 5 mL via ORAL
  Filled 2015-12-02 (×4): qty 5

## 2015-12-02 MED ORDER — ALBUTEROL SULFATE (2.5 MG/3ML) 0.083% IN NEBU: 2.5000 mg | INHALATION_SOLUTION | Freq: Four times a day (QID) | RESPIRATORY_TRACT | Status: DC

## 2015-12-02 NOTE — Progress Notes (Signed)
ANTIBIOTIC CONSULT NOTE  Pharmacy Consult for Renal Adjustment of ABX if needed ABX:  Rocephin and Zithromax  Allergies  Allergen Reactions  . Sulfa Antibiotics Other (See Comments)    unknown   Patient Measurements: Height: 5\' 3"  (160 cm) Weight: 239 lb 1.6 oz (108.5 kg) IBW/kg (Calculated) : 52.4  Vital Signs: Temp: 98 F (36.7 C) (10/29 0630) Temp Source: Oral (10/29 0630) BP: 117/58 (10/29 0630) Pulse Rate: 84 (10/29 0630)  Labs:  Recent Labs  12/01/15 1626 12/02/15 0505  WBC 13.1* 12.0*  HGB 13.2 12.5  PLT 387 404*  CREATININE 0.74  --    No results for input(s): VANCOTROUGH, VANCOPEAK, VANCORANDOM, GENTTROUGH, GENTPEAK, GENTRANDOM, TOBRATROUGH, TOBRAPEAK, TOBRARND, AMIKACINPEAK, AMIKACINTROU, AMIKACIN in the last 72 hours.   Medical History: Past Medical History:  Diagnosis Date  . Asthma   . Chronic pain began in 2011 after jooint replacements  . Depression   . Reflux    Assessment: 60yo female started on Rocephin and Zithromax.  No renal adjustment needed.  Estimated Creatinine Clearance: 89.4 mL/min (by C-G formula based on SCr of 0.74 mg/dL).  Plan: Continue current Rx F/U to switch Zithromax to PO  Wayland DenisHall, Armonie Staten A, [facility] LLCRPH 12/02/2015

## 2015-12-02 NOTE — Progress Notes (Signed)
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  She is doing a little better.  No other complaints.  She is not on home oxygen, and has been seeing her PCP in [place].     Assessment & Plan:   Principal Problem:   Acute respiratory failure with hypoxia (HCC) Active Problems:   Morbid obesity (HCC)   CAP (community acquired pneumonia)   Acute asthma exacerbation   Asthma exacerbation with possible CAP:    Doing better.  Will give RTC nebs, add a little antitussive, and continue with IV Steroids, IV Rocephin and IV Zithromax.  Suspect D/C on Tuesday.   DVT prophylaxis: Levonox Code Status: FULL CODE.  Family Communication: None.  Disposition Plan: to home when able.   Consultants:   None.   Procedures:   None.   Antimicrobials: Anti-infectives    Start     Dose/Rate Route Frequency Ordered Stop   12/02/15 2100  azithromycin (ZITHROMAX) 500 mg in dextrose 5 % 250 mL IVPB     500 mg 250 mL/hr over 60 Minutes Intravenous Every 24 hours 12/01/15 2034 12/09/15 2059   12/02/15 2000  cefTRIAXone (ROCEPHIN) 1 g in dextrose 5 % 50 mL IVPB     1 g 100 mL/hr over 30 Minutes Intravenous Every 24 hours 12/01/15 2034 12/09/15 1959   12/01/15 2100  azithromycin (ZITHROMAX) 500 mg in dextrose 5 % 250 mL IVPB     500 mg 250 mL/hr over 60 Minutes Intravenous  Once 12/01/15 2048 12/01/15 2212   12/01/15 1830  cefTRIAXone (ROCEPHIN) 1 g in dextrose 5 % 50 mL IVPB     1 g 100 mL/hr over 30 Minutes Intravenous  Once 12/01/15 1824 12/01/15 1903   12/01/15 1830  azithromycin (ZITHROMAX) 500 mg in dextrose 5 % 250 mL IVPB  Status:  Discontinued     500 mg 250 mL/hr over 60 Minutes Intravenous  Once 12/01/15 1824 12/01/15 2049         Subjective: Feeling a little better.  Still coughing.    Objective: Vitals:   12/01/15 2127 12/02/15 0215 12/02/15 0630 12/02/15 0931  BP:   (!) 117/58   Pulse:   84   Resp:   16   Temp:   98 F (36.7 C)   TempSrc:   Oral   SpO2: 98% 93% 97% 91%  Weight:      Height:        Intake/Output Summary (Last 24 hours) at 12/02/15 1007 Last data filed at 12/02/15 0900  Gross per 24 hour  Intake              780 ml  Output             1300 ml  Net             -520 ml   Filed Weights   12/01/15 1615 12/01/15 2008  Weight: 109.3 kg (241 lb) 108.5 kg (239 lb 1.6 oz)    Examination:  General exam: Appears calm and comfortable  Respiratory system: She is still wheeziing bilaterally.  Respiratory effort normal. Cardiovascular system: S1 & S2 heard, RRR. No JVD, murmurs, rubs, gallops or clicks. No pedal edema. Gastrointestinal system: Abdomen is nondistended, soft  and nontender. No organomegaly or masses felt. Normal bowel sounds heard. Central nervous system: Alert and oriented. No focal neurological deficits. Extremities: Symmetric 5 x 5 power. Skin: No rashes, lesions or ulcers Psychiatry: Judgement and insight appear normal. Mood & affect appropriate.   Data Reviewed: I have personally reviewed following labs and imaging studies  CBC:  Recent Labs Lab 12/01/15 1626 12/02/15 0505  WBC 13.1* 12.0*  NEUTROABS 10.9*  --   HGB 13.2 12.5  HCT 39.6 37.1  MCV 90.8 90.0  PLT 387 404*   Basic Metabolic Panel:  Recent Labs Lab 12/01/15 1626  NA 140  K 3.6  CL 106  CO2 26  GLUCOSE 99  BUN 13  CREATININE 0.74  CALCIUM 8.6*    Recent Labs Lab 12/01/15 1858  PROCALCITON <0.10    Recent Results (from the past 240 hour(s))  Culture, blood (routine x 2)     Status: None (Preliminary result)   Collection Time: 12/01/15  6:58 PM  Result Value Ref Range Status   Specimen Description BLOOD RIGHT HAND  Final   Special Requests BOTTLES DRAWN AEROBIC AND  ANAEROBIC 2CC EACH  Final   Culture NO GROWTH < 12 HOURS  Final   Report Status PENDING  Incomplete  Culture, blood (routine x 2)     Status: None (Preliminary result)   Collection Time: 12/01/15  7:20 PM  Result Value Ref Range Status   Specimen Description BLOOD RIGHT HAND  Final   Special Requests BOTTLES DRAWN AEROBIC AND ANAEROBIC 4CC EACH  Final   Culture NO GROWTH < 12 HOURS  Final   Report Status PENDING  Incomplete     Radiology Studies: Dg Chest Portable 1 View  Result Date: 12/01/2015 CLINICAL DATA:  Shortness of breath, productive cough. EXAM: PORTABLE CHEST 1 VIEW COMPARISON:  Radiographs of September 07, 2013. FINDINGS: The heart size and mediastinal contours are within normal limits. No pneumothorax or pleural effusion is noted. Right lung is clear. Left apical airspace opacity is noted concerning for possible pneumonia. The visualized skeletal structures are unremarkable. IMPRESSION: Left apical opacity is noted concerning for possible pneumonia. Follow-up PA and lateral radiographs in 3-4 weeks after antibiotic therapy trial is recommended to ensure resolution and rule out neoplasm. Electronically Signed   By: Lupita RaiderJames  Green Jr, M.D.   On: 12/01/2015 17:08    Scheduled Meds: . albuterol  2.5 mg Nebulization Q6H  . ARIPiprazole  5 mg Oral Daily  . azithromycin  500 mg Intravenous Q24H  . buPROPion  300 mg Oral QPM  . cefTRIAXone (ROCEPHIN)  IV  1 g Intravenous Q24H  . clonazePAM  1 mg Oral QHS  . enoxaparin (LOVENOX) injection  40 mg Subcutaneous Q24H  . gabapentin  300 mg Oral BID  . guaiFENesin  600 mg Oral BID  . ipratropium-albuterol  3 mL Nebulization Q6H  . mouth rinse  15 mL Mouth Rinse BID  . methylPREDNISolone (SOLU-MEDROL) injection  60 mg Intravenous Q8H  . mometasone-formoterol  2 puff Inhalation BID  . pantoprazole  40 mg Oral Daily  . PARoxetine  37.5 mg Oral QPM  . rOPINIRole  0.5 mg Oral QHS  . traMADol  50 mg Oral BID   Continuous Infusions:    LOS:  1 day   Alekhya Gravlin, MD FACP Hospitalist.   If 7PM-7AM, please contact night-coverage [email or web address] Password [email or web address] 12/02/2015, 10:07 AM

## 2015-12-03 LAB — BASIC METABOLIC PANEL
Anion gap: 6 (ref 5–15)
BUN: 20 mg/dL (ref 6–20)
CO2: 27 mmol/L (ref 22–32)
CREATININE: 0.68 mg/dL (ref 0.44–1.00)
Calcium: 8.4 mg/dL — ABNORMAL LOW (ref 8.9–10.3)
Chloride: 107 mmol/L (ref 101–111)
Glucose, Bld: 135 mg/dL — ABNORMAL HIGH (ref 65–99)
Potassium: 4.4 mmol/L (ref 3.5–5.1)
SODIUM: 140 mmol/L (ref 135–145)

## 2015-12-03 LAB — CBC
HCT: 39.2 % (ref 36.0–46.0)
Hemoglobin: 12.7 g/dL (ref 12.0–15.0)
MCH: 29.7 pg (ref 26.0–34.0)
MCHC: 32.4 g/dL (ref 30.0–36.0)
MCV: 91.6 fL (ref 78.0–100.0)
PLATELETS: 435 10*3/uL — AB (ref 150–400)
RBC: 4.28 MIL/uL (ref 3.87–5.11)
RDW: 14.6 % (ref 11.5–15.5)
WBC: 22.3 10*3/uL — ABNORMAL HIGH (ref 4.0–10.5)

## 2015-12-03 LAB — PROCALCITONIN

## 2015-12-03 LAB — STREP PNEUMONIAE URINARY ANTIGEN: Strep Pneumo Urinary Antigen: NEGATIVE

## 2015-12-03 MED ORDER — GUAIFENESIN-CODEINE 100-10 MG/5ML PO SOLN
10.0000 mL | ORAL | Status: DC | PRN
  Administered 2015-12-03 – 2015-12-04 (×3): 10 mL via ORAL
  Filled 2015-12-03 (×4): qty 10

## 2015-12-03 MED ORDER — IPRATROPIUM-ALBUTEROL 0.5-2.5 (3) MG/3ML IN SOLN
3.0000 mL | Freq: Four times a day (QID) | RESPIRATORY_TRACT | Status: DC
  Administered 2015-12-03 – 2015-12-04 (×4): 3 mL via RESPIRATORY_TRACT
  Filled 2015-12-03 (×6): qty 3

## 2015-12-03 MED FILL — ALBUTEROL 0.083% INHAL SOLN: (2.5 MG/3ML | 10 days supply | Qty: 150 | Fill #0

## 2015-12-03 NOTE — Progress Notes (Signed)
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  She is doing a little better.  No other complaints.  She is not on home oxygen, and has been seeing her PCP in [place].     Assessment & Plan:   Principal Problem:   Acute respiratory failure with hypoxia (HCC) Active Problems:   Morbid obesity (HCC)   CAP (community acquired pneumonia)   Acute asthma exacerbation  Asthma exacerbation with possible CAP:    Doing better.  Will give RTC nebs, add a little antitussive, and continue with IV Steroids, IV Rocephin and IV Zithromax.  Suspect D/C on Tuesday.   DVT prophylaxis: Levonox Code Status: FULL CODE.  Family Communication: None.  Disposition Plan: to home when able.   Consultants:   None.   Procedures:   None.    Antimicrobials: Anti-infectives    Start     Dose/Rate Route Frequency Ordered Stop   12/02/15 2100  azithromycin (ZITHROMAX) 500 mg in dextrose 5 % 250 mL IVPB     500 mg 250 mL/hr over 60 Minutes Intravenous Every 24 hours 12/01/15 2034 12/09/15 2059   12/02/15 2000  cefTRIAXone (ROCEPHIN) 1 g in dextrose 5 % 50 mL IVPB     1 g 100 mL/hr over 30 Minutes Intravenous Every 24 hours 12/01/15 2034 12/09/15 1959   12/01/15 2100  azithromycin (ZITHROMAX) 500 mg in dextrose 5 % 250 mL IVPB     500 mg 250 mL/hr over 60 Minutes Intravenous  Once 12/01/15 2048 12/01/15 2212   12/01/15 1830  cefTRIAXone (ROCEPHIN) 1 g in dextrose 5 % 50 mL IVPB     1 g 100 mL/hr over 30 Minutes Intravenous  Once 12/01/15 1824 12/01/15 1903   12/01/15 1830  azithromycin (ZITHROMAX) 500 mg in dextrose 5 % 250 mL IVPB  Status:  Discontinued     500 mg 250 mL/hr over 60 Minutes Intravenous  Once 12/01/15 1824 12/01/15  2049       Subjective:  Still has some wheezing.   Objective: Vitals:   12/03/15 0206 12/03/15 0519 12/03/15 0804 12/03/15 0808  BP:  123/77    Pulse:  72    Resp:  18    Temp:  97.9 F (36.6 C)    TempSrc:  Oral    SpO2: 94% 93% 94% 100%  Weight:      Height:        Intake/Output Summary (Last 24 hours) at 12/03/15 1121 Last data filed at 12/02/15 1700  Gross per 24 hour  Intake              480 ml  Output              400 ml  Net               80 ml   Filed Weights   12/01/15 1615 12/01/15 2008  Weight: 109.3 kg (241 lb) 108.5 kg (239 lb 1.6 oz)    Examination:  General exam: Appears calm and comfortable  Respiratory system: Clear to auscultation. Respiratory effort normal. Cardiovascular system: S1 & S2 heard, RRR. No JVD, murmurs, rubs, gallops or clicks. No pedal edema. Gastrointestinal system: Abdomen is nondistended, soft and nontender. No organomegaly  or masses felt. Normal bowel sounds heard. Central nervous system: Alert and oriented. No focal neurological deficits. Extremities: Symmetric 5 x 5 power. Skin: No rashes, lesions or ulcers Psychiatry: Judgement and insight appear normal. Mood & affect appropriate.   Data Reviewed: I have personally reviewed following labs and imaging studies  CBC:  Recent Labs Lab 12/01/15 1626 12/02/15 0505 12/03/15 0700  WBC 13.1* 12.0* 22.3*  NEUTROABS 10.9*  --   --   HGB 13.2 12.5 12.7  HCT 39.6 37.1 39.2  MCV 90.8 90.0 91.6  PLT 387 404* 435*   Basic Metabolic Panel:  Recent Labs Lab 12/01/15 1626 12/03/15 0700  NA 140 140  K 3.6 4.4  CL 106 107  CO2 26 27  GLUCOSE 99 135*  BUN 13 20  CREATININE 0.74 0.68  CALCIUM 8.6* 8.4*   Sepsis Labs:  Recent Labs Lab 12/01/15 1858 12/03/15 0700  PROCALCITON <0.10 <0.10    Recent Results (from the past 240 hour(s))  Culture, blood (routine x 2)     Status: None (Preliminary result)   Collection Time: 12/01/15  6:58 PM  Result Value Ref Range Status    Specimen Description BLOOD RIGHT HAND  Final   Special Requests BOTTLES DRAWN AEROBIC AND ANAEROBIC 2CC EACH  Final   Culture NO GROWTH 2 DAYS  Final   Report Status PENDING  Incomplete  Culture, blood (routine x 2)     Status: None (Preliminary result)   Collection Time: 12/01/15  7:20 PM  Result Value Ref Range Status   Specimen Description BLOOD RIGHT HAND  Final   Special Requests BOTTLES DRAWN AEROBIC AND ANAEROBIC 4CC EACH  Final   Culture NO GROWTH 2 DAYS  Final   Report Status PENDING  Incomplete  Culture, sputum-assessment     Status: None   Collection Time: 12/02/15 11:42 AM  Result Value Ref Range Status   Specimen Description EXPECTORATED SPUTUM  Final   Special Requests NONE  Final   Sputum evaluation   Final    THIS SPECIMEN IS ACCEPTABLE FOR SPUTUM CULTURE Performed at [facility]    Report Status 12/02/2015 FINAL  Final  Culture, respiratory (NON-Expectorated)     Status: None (Preliminary result)   Collection Time: 12/02/15 11:42 AM  Result Value Ref Range Status   Specimen Description EXPECTORATED SPUTUM  Final   Special Requests NONE  Final   Gram Stain   Final    MODERATE WBC PRESENT, PREDOMINANTLY PMN FEW SQUAMOUS EPITHELIAL CELLS PRESENT ABUNDANT GRAM POSITIVE COCCI IN PAIRS IN CLUSTERS ABUNDANT GRAM NEGATIVE RODS MODERATE GRAM NEGATIVE DIPLOCOCCI FEW GRAM POSITIVE RODS Performed at [facility]    Culture PENDING  Incomplete   Report Status PENDING  Incomplete     Radiology Studies: Dg Chest Portable 1 View  Result Date: 12/01/2015 CLINICAL DATA:  Shortness of breath, productive cough. EXAM: PORTABLE CHEST 1 VIEW COMPARISON:  Radiographs of September 07, 2013. FINDINGS: The heart size and mediastinal contours are within normal limits. No pneumothorax or pleural effusion is noted. Right lung is clear. Left apical airspace opacity is noted concerning for possible pneumonia. The visualized skeletal structures are unremarkable. IMPRESSION:  Left apical opacity is noted concerning for possible pneumonia. Follow-up PA and lateral radiographs in 3-4 weeks after antibiotic therapy trial is recommended to ensure resolution and rule out neoplasm. Electronically Signed   By: Lupita RaiderJames  Green Jr, M.D.   On: 12/01/2015 17:08    Scheduled Meds: . ARIPiprazole  5 mg Oral  Daily  . azithromycin  500 mg Intravenous Q24H  . buPROPion  300 mg Oral QPM  . cefTRIAXone (ROCEPHIN)  IV  1 g Intravenous Q24H  . clonazePAM  1 mg Oral QHS  . enoxaparin (LOVENOX) injection  40 mg Subcutaneous Q24H  . gabapentin  300 mg Oral BID  . guaiFENesin  600 mg Oral BID  . ipratropium-albuterol  3 mL Nebulization Q6H WA  . mouth rinse  15 mL Mouth Rinse BID  . methylPREDNISolone (SOLU-MEDROL) injection  60 mg Intravenous Q8H  . mometasone-formoterol  2 puff Inhalation BID  . pantoprazole  40 mg Oral Daily  . PARoxetine  37.5 mg Oral QPM  . rOPINIRole  0.5 mg Oral QHS  . traMADol  50 mg Oral BID   Continuous Infusions:    LOS: 2 days   Zakira Ressel, MD FACP Hospitalist.   If 7PM-7AM, please contact night-coverage [email or web address] Password [email or web address] 12/03/2015, 11:21 AM

## 2015-12-04 LAB — CULTURE, RESPIRATORY: CULTURE: NORMAL

## 2015-12-04 LAB — CULTURE, RESPIRATORY W GRAM STAIN

## 2015-12-04 MED ORDER — PREDNISONE 20 MG PO TABS: 20.0000 mg | ORAL_TABLET | Freq: Two times a day (BID) | ORAL | 0 refills | Status: DC

## 2015-12-04 MED ORDER — LEVOFLOXACIN 750 MG PO TABS: 750.0000 mg | ORAL_TABLET | Freq: Every day | ORAL | 0 refills | Status: DC

## 2015-12-04 MED ORDER — AZITHROMYCIN 250 MG PO TABS: 500.0000 mg | ORAL_TABLET | Freq: Every day | ORAL | Status: DC

## 2015-12-04 MED ORDER — GUAIFENESIN-CODEINE 100-10 MG/5ML PO SOLN: 10.0000 mL | ORAL | 0 refills | Status: DC | PRN

## 2015-12-04 MED ORDER — PREDNISONE 20 MG PO TABS: 20.0000 mg | ORAL_TABLET | Freq: Two times a day (BID) | ORAL | Status: DC

## 2015-12-04 MED FILL — predniSONE 20 MG TABS: 20 | 7 days supply | Qty: 14 | Fill #0

## 2015-12-04 MED FILL — CHERATUSSIN AC SYRUP: 100-10 | 2 days supply | Qty: 120 | Fill #0

## 2015-12-04 MED FILL — levoFLOXacin 750 MG TABS: 750 | 7 days supply | Qty: 7 | Fill #0

## 2015-12-04 NOTE — Progress Notes (Signed)
Patient discharged with instructions, prescription, and care notes.  Verbalized understanding via teach back.  IV was removed and the site was WNL. Patient voiced no further complaints or concerns at the time of discharge.  Appointments scheduled per instructions.  Patient left the floor via w/c family  And staff in stable condition. 

## 2015-12-04 NOTE — Discharge Summary (Signed)
Physician Discharge Summary  Rebecca [name] ZOX:[id number] DOB: 06-May-1955 DOA: 12/01/2015  PCP: Cain Saupe, MD  Admit date: 12/01/2015 Discharge date: 12/04/2015  Admitted From: Home  Disposition:  Home   Recommendations for Outpatient Follow-up:  1. Follow up with PCP in 1-2 weeks 2. Please obtain BMP/CBC in one week  Home Health:None  Equipment/Devices: None   Discharge Condition: Improved  CODE STATUS: FULL  Diet recommendation: Regular   Brief/Interim Summary: Patient was admitted on 12/01/15 by Candelaria Celeste for asthma exacerbation secondary to community acquired pneumonia. Per his H&P " Rebecca [name] is a 60 y.o. female with a history of asthma, chronic pain, depression, reflux, restless leg syndrome. Patient seen for 7 days worsening cough with shortness of breath that is acutely worse over the past 2-3 days. Shortness of breath worse with ambulation and improved with rest. Has productive loose cough. Shortness of breath was worse to the point where she was dyspneic to a few feet. She went to walk-in care and [facility] today and received a nebulizer treatment, Atrovent neb, and Solu-Medrol there. She came to the emergency department here for further care due to no improvement."  HOSPITAL COURSE:  Patient was admitted for asthma exacerbation with possible pneumonia. She was treated with a nebulizer with antitussive, IV steroids which will be continued for one week as an outpatient at Prednisone 20mg  BID, and empirical antibiotics rocephin and Zithromax.  She has been able to subsequently ambulate without oxygen, and is ready for discharge.  She did have elevation of her WBC, felt to be due to her IV steroids.  She will be discharged home on her meds, cough syrup, and 7 days of Prednisone and Levoquin.   She will follow up with her PCP next week.  Thank you and Good Day.    Discharge Diagnoses:  Principal Problem:   Acute respiratory failure with hypoxia (HCC) Active  Problems:   Morbid obesity (HCC)   CAP (community acquired pneumonia)   Acute asthma exacerbation  1. Asthma exacerbation with possible community acquired pneumonia. She has overall improved. Will continue to give RTC nebs with antitussive and IV steroids as an outpatient. Will discontinue empirical antibiotics Rocephin and Zithromax.   Discharge Instructions  Discharge Instructions    Diet - low sodium heart healthy    Complete by:  As directed    Discharge instructions    Complete by:  As directed    Take your Prednisone with food.  Avoid NSAIDS.  Finish your antibiotics.  See your PCP in follow up next week.   Increase activity slowly    Complete by:  As directed        Medication List    STOP taking these medications   ibuprofen 200 MG tablet Commonly known as:  ADVIL,MOTRIN     TAKE these medications   albuterol (2.5 MG/3ML) 0.083% nebulizer solution Commonly known as:  PROVENTIL Take 2.5 mg by nebulization every 4 (four) hours as needed for wheezing or shortness of breath.   albuterol 108 (90 Base) MCG/ACT inhaler Commonly known as:  PROVENTIL HFA;VENTOLIN HFA Inhale 2 puffs into the lungs every 4 (four) hours as needed for wheezing or shortness of breath.   ARIPiprazole 5 MG tablet Commonly known as:  ABILIFY Take 1 tablet (5 mg total) by mouth daily. For mood control   buPROPion 300 MG 24 hr tablet Commonly known as:  WELLBUTRIN XL Take 1 tablet (300 mg total) by mouth every morning. What changed:  when to  take this   clonazePAM 1 MG tablet Commonly known as:  KLONOPIN Take 1 tablet (1 mg total) by mouth daily. What changed:  when to take this   Fluticasone-Salmeterol 250-50 MCG/DOSE Aepb Commonly known as:  ADVAIR Inhale 1 puff into the lungs 2 (two) times daily.   gabapentin 300 MG capsule Commonly known as:  NEURONTIN Take 1 capsule (300 mg total) by mouth 2 (two) times daily. For agitation/pain managment   guaiFENesin-codeine 100-10 MG/5ML  syrup Take 10 mLs by mouth every 4 (four) hours as needed for cough.   levofloxacin 750 MG tablet Commonly known as:  LEVAQUIN Take 1 tablet (750 mg total) by mouth daily.   lidocaine 5 % Commonly known as:  LIDODERM Place 1 patch onto the skin daily as needed (for pain). Remove & Discard patch within 12 hours or as directed by MD   pantoprazole 40 MG tablet Commonly known as:  PROTONIX Take 1 tablet (40 mg total) by mouth daily. For acid reflux   PARoxetine 37.5 MG 24 hr tablet Commonly known as:  PAXIL CR Take 1 tablet (37.5 mg total) by mouth daily. What changed:  when to take this   predniSONE 20 MG tablet Commonly known as:  DELTASONE Take 1 tablet (20 mg total) by mouth 2 (two) times daily with a meal.   rOPINIRole 0.5 MG tablet Commonly known as:  REQUIP Take 1 tablet (0.5 mg total) by mouth at bedtime. For restless leg syndrome   traMADol 50 MG tablet Commonly known as:  ULTRAM Take 50 mg by mouth 2 (two) times daily.   UCERIS 9 MG Tb24 Generic drug:  Budesonide Take 1 tablet by mouth daily.       Allergies  Allergen Reactions  . Sulfa Antibiotics Other (See Comments)    unknown    Consultations:  None    Procedures/Studies: Dg Chest Portable 1 View  Result Date: 12/01/2015 CLINICAL DATA:  Shortness of breath, productive cough. EXAM: PORTABLE CHEST 1 VIEW COMPARISON:  Radiographs of September 07, 2013. FINDINGS: The heart size and mediastinal contours are within normal limits. No pneumothorax or pleural effusion is noted. Right lung is clear. Left apical airspace opacity is noted concerning for possible pneumonia. The visualized skeletal structures are unremarkable. IMPRESSION: Left apical opacity is noted concerning for possible pneumonia. Follow-up PA and lateral radiographs in 3-4 weeks after antibiotic therapy trial is recommended to ensure resolution and rule out neoplasm. Electronically Signed   By: Lupita RaiderJames  Green Jr, M.D.   On: 12/01/2015 17:08     Discharge Exam: Vitals:   12/03/15 2109 12/04/15 0657  BP: 123/75 131/62  Pulse: 69 63  Resp: 20 20  Temp: 98.4 F (36.9 C) 97.8 F (36.6 C)   Vitals:   12/03/15 2109 12/04/15 0657 12/04/15 0831 12/04/15 0835  BP: 123/75 131/62    Pulse: 69 63    Resp: 20 20    Temp: 98.4 F (36.9 C) 97.8 F (36.6 C)    TempSrc: Oral Oral    SpO2: 95% 96% 92% 96%  Weight:      Height:        General: Pt is alert, awake, not in acute distress Cardiovascular: RRR, S1/S2 +, no rubs, no gallops Respiratory: CTA bilaterally, some wheezing.  Abdominal: Soft, NT, ND, bowel sounds + Extremities: no edema, no cyanosis  The results of significant diagnostics from this hospitalization (including imaging, microbiology, ancillary and laboratory) are listed below for reference.     Microbiology: Recent Results (  from the past 240 hour(s))  Culture, blood (routine x 2)     Status: None (Preliminary result)   Collection Time: 12/01/15  6:58 PM  Result Value Ref Range Status   Specimen Description BLOOD RIGHT HAND  Final   Special Requests BOTTLES DRAWN AEROBIC AND ANAEROBIC 2CC EACH  Final   Culture NO GROWTH 3 DAYS  Final   Report Status PENDING  Incomplete  Culture, blood (routine x 2)     Status: None (Preliminary result)   Collection Time: 12/01/15  7:20 PM  Result Value Ref Range Status   Specimen Description BLOOD RIGHT HAND  Final   Special Requests BOTTLES DRAWN AEROBIC AND ANAEROBIC 4CC EACH  Final   Culture NO GROWTH 3 DAYS  Final   Report Status PENDING  Incomplete  Culture, sputum-assessment     Status: None   Collection Time: 12/02/15 11:42 AM  Result Value Ref Range Status   Specimen Description EXPECTORATED SPUTUM  Final   Special Requests NONE  Final   Sputum evaluation   Final    THIS SPECIMEN IS ACCEPTABLE FOR SPUTUM CULTURE Performed at [facility]    Report Status 12/02/2015 FINAL  Final  Culture, respiratory (NON-Expectorated)     Status: None   Collection  Time: 12/02/15 11:42 AM  Result Value Ref Range Status   Specimen Description EXPECTORATED SPUTUM  Final   Special Requests NONE  Final   Gram Stain   Final    MODERATE WBC PRESENT, PREDOMINANTLY PMN FEW SQUAMOUS EPITHELIAL CELLS PRESENT ABUNDANT GRAM POSITIVE COCCI IN PAIRS IN CLUSTERS ABUNDANT GRAM NEGATIVE RODS MODERATE GRAM NEGATIVE DIPLOCOCCI FEW GRAM POSITIVE RODS    Culture   Final    Consistent with normal respiratory flora. Performed at [facility]    Report Status 12/04/2015 FINAL  Final     Recent Labs Lab 12/01/15 1626 12/03/15 0700  NA 140 140  K 3.6 4.4  CL 106 107  CO2 26 27  GLUCOSE 99 135*  BUN 13 20  CREATININE 0.74 0.68  CALCIUM 8.6* 8.4*       Component Value Date/Time   COLORURINE YELLOW 09/07/2013 0000   APPEARANCEUR CLEAR 09/07/2013 0000   LABSPEC 1.013 09/07/2013 0000   PHURINE 7.0 09/07/2013 0000   GLUCOSEU NEGATIVE 09/07/2013 0000   HGBUR NEGATIVE 09/07/2013 0000   BILIRUBINUR NEGATIVE 09/07/2013 0000   KETONESUR NEGATIVE 09/07/2013 0000   PROTEINUR NEGATIVE 09/07/2013 0000   UROBILINOGEN 0.2 09/07/2013 0000   NITRITE NEGATIVE 09/07/2013 0000   LEUKOCYTESUR NEGATIVE 09/07/2013 0000   Recent Results (from the past 240 hour(s))  Culture, blood (routine x 2)     Status: None (Preliminary result)   Collection Time: 12/01/15  6:58 PM  Result Value Ref Range Status   Specimen Description BLOOD RIGHT HAND  Final   Special Requests BOTTLES DRAWN AEROBIC AND ANAEROBIC 2CC EACH  Final   Culture NO GROWTH 3 DAYS  Final   Report Status PENDING  Incomplete  Culture, blood (routine x 2)     Status: None (Preliminary result)   Collection Time: 12/01/15  7:20 PM  Result Value Ref Range Status   Specimen Description BLOOD RIGHT HAND  Final   Special Requests BOTTLES DRAWN AEROBIC AND ANAEROBIC 4CC EACH  Final   Culture NO GROWTH 3 DAYS  Final   Report Status PENDING  Incomplete  Culture, sputum-assessment     Status: None   Collection  Time: 12/02/15 11:42 AM  Result Value Ref Range  Status   Specimen Description EXPECTORATED SPUTUM  Final   Special Requests NONE  Final   Sputum evaluation   Final    THIS SPECIMEN IS ACCEPTABLE FOR SPUTUM CULTURE Performed at [facility]    Report Status 12/02/2015 FINAL  Final  Culture, respiratory (NON-Expectorated)     Status: None   Collection Time: 12/02/15 11:42 AM  Result Value Ref Range Status   Specimen Description EXPECTORATED SPUTUM  Final   Special Requests NONE  Final   Gram Stain   Final    MODERATE WBC PRESENT, PREDOMINANTLY PMN FEW SQUAMOUS EPITHELIAL CELLS PRESENT ABUNDANT GRAM POSITIVE COCCI IN PAIRS IN CLUSTERS ABUNDANT GRAM NEGATIVE RODS MODERATE GRAM NEGATIVE DIPLOCOCCI FEW GRAM POSITIVE RODS    Culture   Final    Consistent with normal respiratory flora. Performed at [facility]    Report Status 12/04/2015 FINAL  Final   SIGNED:  Houston Siren, MD FACP  [facility] 12/04/2015, 1:46 PM If 7PM-7AM, please contact night-coverage [email or web address] Password [email or web address]  By signing my name below, I, Cynda Acres, attest that this documentation has been prepared under the direction and in the presence of Houston Siren, MD. Electronically signed: Cynda Acres, Scribe. 12/04/15 11: 45 AM

## 2015-12-06 LAB — CULTURE, BLOOD (ROUTINE X 2)
CULTURE: NO GROWTH
CULTURE: NO GROWTH

## 2015-12-13 MED FILL — clonazePAM 1 MG TABS: 1 | 30 days supply | Qty: 30 | Fill #2

## 2015-12-13 MED FILL — ADVAIR 250/50 DISKUS: 250-50 | 90 days supply | Qty: 180 | Fill #1

## 2015-12-19 DIAGNOSIS — J181 Lobar pneumonia, unspecified organism: Secondary | ICD-10-CM | POA: Diagnosis not present

## 2015-12-21 ENCOUNTER — Ambulatory Visit (INDEPENDENT_AMBULATORY_CARE_PROVIDER_SITE_OTHER): Payer: 59 | Admitting: Psychiatry

## 2015-12-21 ENCOUNTER — Encounter (HOSPITAL_COMMUNITY): Payer: Self-pay | Admitting: Psychiatry

## 2015-12-21 VITALS — BP 120/90 | Ht 63.0 in | Wt 239.0 lb

## 2015-12-21 DIAGNOSIS — Z813 Family history of other psychoactive substance abuse and dependence: Secondary | ICD-10-CM | POA: Diagnosis not present

## 2015-12-21 DIAGNOSIS — F333 Major depressive disorder, recurrent, severe with psychotic symptoms: Secondary | ICD-10-CM

## 2015-12-21 DIAGNOSIS — Z818 Family history of other mental and behavioral disorders: Secondary | ICD-10-CM | POA: Diagnosis not present

## 2015-12-21 DIAGNOSIS — Z79899 Other long term (current) drug therapy: Secondary | ICD-10-CM

## 2015-12-21 MED ORDER — GABAPENTIN 300 MG PO CAPS: 300.0000 mg | ORAL_CAPSULE | Freq: Two times a day (BID) | ORAL | 2 refills | Status: DC

## 2015-12-21 MED ORDER — PAROXETINE HCL ER 37.5 MG PO TB24: 37.5000 mg | ORAL_TABLET | Freq: Every evening | ORAL | 2 refills | Status: DC

## 2015-12-21 MED ORDER — CLONAZEPAM 1 MG PO TABS: 1.0000 mg | ORAL_TABLET | Freq: Every day | ORAL | 2 refills | Status: DC

## 2015-12-21 MED ORDER — ARIPIPRAZOLE 5 MG PO TABS: 5.0000 mg | ORAL_TABLET | Freq: Every day | ORAL | 2 refills | Status: DC

## 2015-12-21 MED ORDER — BUPROPION HCL ER (XL) 300 MG PO TB24: 300.0000 mg | ORAL_TABLET | ORAL | 2 refills | Status: DC

## 2015-12-21 MED FILL — PAROXETINE CR 37.5 MG TAB: 37.5 | 90 days supply | Qty: 90 | Fill #0

## 2015-12-21 NOTE — Progress Notes (Signed)
Patient ID: Rebecca [name], female   DOB: 04/14/1955, 60 y.o.   MRN: [id number] Patient ID: Rebecca [name], female   DOB: Mar 02, 1955, 60 y.o.   MRN: [id number] Patient ID: Rebecca [name], female   DOB: [birth year], 60 y.o.   MRN: [id number] Patient ID: Rebecca [name], female   DOB: 03-30-1955, 60 y.o.   MRN: [id number] Patient ID: Rebecca [name], female   DOB: April 17, 1955, 60 y.o.   MRN: [id number] Patient ID: Rebecca [name], female   DOB: 22-Oct-1955, 60 y.o.   MRN: [id number] Patient ID: Rebecca [name], female   DOB: 07/09/1955, 60 y.o.   MRN: [id number] Patient ID: Rebecca [name], female   DOB: Mar 05, 1955, 60 y.o.   MRN: [id number] Patient ID: Rebecca [name], female   DOB: 1956/01/22, 60 y.o.   MRN: [id number] Patient ID: Rebecca [name], female   DOB: 1956/01/01, 60 y.o.   MRN: [id number]  Psychiatric Assessment Adult  Patient Identification:  Rebecca [name] Date of Evaluation:  12/21/2015 Chief Complaint: I'm doing better History of Chief Complaint:   Chief Complaint  Patient presents with  . Depression  . Anxiety  . Follow-up    Depression         Past medical history includes anxiety.   Anxiety  Symptoms include nervous/anxious behavior.     this patient is a 60 year old separated white female who lives alone in [place]. 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 She has one daughter and 2 grandchildren  The patient was referred by the [facility] where she was hospitalized July 31 through August 6 after an intentional overdose on Ambien and oxycodone. The patient has had one prior overdose attempt in 2004. This occurred after she found that her first husband had had an affair with her sister years prior.  After the hospital patient 2004 the patient was followed for time by [facility] in [place]. She did fairly well on Paxil CR. Most recently she has been treated [facility] primary  care and Abilify was added. Over the last year however she has lost many important people in her life. 2 of her aunt her best friend another friend and her dog all died within a short period of time. She was going downhill and getting more and more depressed and was dealing with that by drinking 2-3 ounces of liquor per day. She got increasingly despondent and eventually decided to take her life and wrote a suicide note to her family. Reportedly she  was found the next morning and brought to the ER in the ICU and consequently to the behavioral health hospital.  While there are Wellbutrin and trazodone were added. The patient wasn't sleeping well prior to hospitalization but she sleeping much better now. Her mood is improved but her energy and motivation are still poor and she's developed tremor in both hands predicted on the left. 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 It's hard for her to type because of the tremor and she can't remember anything. She's no longer suicidal but just doesn't have much energy to do much of anything right now. She's denies auditory visual sensations or paranoia. She is no longer drinking  The patient returns today after 3 months. Last time she was having chronic diarrhea and was found to have a  bacterial infection on colonoscopy. She was treated for this and it resolved. However last month she had pneumonia and an exacerbation of her asthma and had to be hospitalized for short time. She's come through all of these things and is doing well medically. She does have a fine tremor in both hands which is worse on the left. She states she has had this since she was in the behavioral health hospital last year. I told her this is probably from the combination of her medications but she really doesn't want to change anything because she feels some much better. I told her at some point  we may need to cut down the doses of the antidepressants. Right now her mood and anxiety are under good control and she is sleeping well Review of Systems  Constitutional: Positive for activity change.  HENT: Negative.   Respiratory: Negative.   Cardiovascular: Negative.   Gastrointestinal: Negative.   Endocrine: Negative.   Genitourinary: Negative.   Musculoskeletal: Positive for arthralgias and joint swelling.  Allergic/Immunologic: Negative.   Neurological: Positive for tremors.  Hematological: Negative.   Psychiatric/Behavioral: Positive for depression and dysphoric mood. The patient is nervous/anxious.    Physical Examnot done  Depressive Symptoms: depressed mood, anhedonia, insomnia, difficulty concentrating, anxiety, loss of energy/fatigue,  (Hypo) Manic Symptoms:   Elevated Mood:  No Irritable Mood:  No Grandiosity:  No Distractibility:  Yes Labiality of Mood:  Yes Delusions:  No Hallucinations:  No Impulsivity:  No Sexually Inappropriate Behavior:  No Financial Extravagance:  No Flight of Ideas:  No  Anxiety Symptoms: Excessive Worry:  Yes Panic Symptoms:  Yes Agoraphobia:  No Obsessive Compulsive: No  Symptoms: None, Specific Phobias:  No Social Anxiety:  Yes  Psychotic Symptoms:  Hallucinations: No None Delusions:  No Paranoia:  No   Ideas of Reference:  No  PTSD Symptoms: Ever had a traumatic exposure:  No Had a traumatic exposure in the last month:  No Re-experiencing: No None Hypervigilance:  No Hyperarousal: No None Avoidance: No None  Traumatic Brain Injury: No   Past Psychiatric History: Diagnosis: Maj. depression   Hospitalizations: In 2004 and again last month   Outpatient Care: At [facility] in [facility] past   Substance Abuse Care: none  Self-Mutilation: none  Suicidal Attempts: Overdose attempts twice   Violent Behaviors:none   Past Medical History:   Past Medical History:  Diagnosis Date  . Asthma   . Chronic pain  began in 2011 after jooint replacements  . Depression   . Reflux    History of Loss of Consciousness:  Yes Seizure History:  No Cardiac History:  No Allergies:   Allergies  Allergen Reactions  . Sulfa Antibiotics Other (See Comments)    unknown   Current Medications:  Current Outpatient Prescriptions  Medication Sig Dispense Refill  . albuterol (PROVENTIL HFA;VENTOLIN HFA) 108 (90 BASE) MCG/ACT inhaler Inhale 2 puffs into the lungs every 4 (four) hours as needed for wheezing or shortness of breath.    Marland Kitchen. albuterol (PROVENTIL) (2.5 MG/3ML) 0.083% nebulizer solution Take 2.5 mg by nebulization every 4 (four) hours as needed for wheezing or shortness of breath.    . ARIPiprazole (ABILIFY) 5 MG tablet Take 1 tablet (5 mg total) by mouth daily. For mood control 90 tablet 2  . Budesonide (UCERIS) 9 MG TB24 Take 1 tablet by mouth daily.    Marland Kitchen. buPROPion (WELLBUTRIN XL) 300 MG 24 hr tablet Take 1 tablet (300 mg total) by mouth every morning. 90 tablet  2  . clonazePAM (KLONOPIN) 1 MG tablet Take 1 tablet (1 mg total) by mouth at bedtime. 30 tablet 2  . Fluticasone-Salmeterol (ADVAIR) 250-50 MCG/DOSE AEPB Inhale 1 puff into the lungs 2 (two) times daily.    Marland Kitchen. gabapentin (NEURONTIN) 300 MG capsule Take 1 capsule (300 mg total) by mouth 2 (two) times daily. For agitation/pain managment 180 capsule 2  . guaiFENesin-codeine 100-10 MG/5ML syrup Take 10 mLs by mouth every 4 (four) hours as needed for cough. 120 mL 0  . levofloxacin (LEVAQUIN) 750 MG tablet Take 1 tablet (750 mg total) by mouth daily. 7 tablet 0  . lidocaine (LIDODERM) 5 % Place 1 patch onto the skin daily as needed (for pain). Remove & Discard patch within 12 hours or as directed by MD    . pantoprazole (PROTONIX) 40 MG tablet Take 1 tablet (40 mg total) by mouth daily. For acid reflux    . PARoxetine (PAXIL CR) 37.5 MG 24 hr tablet Take 1 tablet (37.5 mg total) by mouth every evening. 90 tablet 2  . predniSONE (DELTASONE) 20 MG tablet  Take 1 tablet (20 mg total) by mouth 2 (two) times daily with a meal. 14 tablet 0  . rOPINIRole (REQUIP) 0.5 MG tablet Take 1 tablet (0.5 mg total) by mouth at bedtime. For restless leg syndrome    . traMADol (ULTRAM) 50 MG tablet Take 50 mg by mouth 2 (two) times daily.     No current facility-administered medications for this visit.     Previous Psychotropic Medications:  Medication Dose   Paxil CR, Ambien                        Substance Abuse History in the last 12 months: Substance Age of 1st Use Last Use Amount Specific Type  Nicotine      Alcohol    was drinking 2-3 ounces of liquor per day until about 3 weeks ago    Cannabis      Opiates      Cocaine      Methamphetamines      LSD      Ecstasy      Benzodiazepines      Caffeine      Inhalants      Others:                          Medical Consequences of Substance Abuse: none  Legal Consequences of Substance Abuse: none  Family Consequences of Substance Abuse: none  Blackouts:  No DT's:  No Withdrawal Symptoms:  No None  Social History: Current [place] Residence: [place] [address] [place] Place of Birth: [place] Family Members: Husband daughter 2 grandchildren Marital Status:  Married Children:   Sons:   Daughters: 1 Relationships: Education:  [employer] Educational Problems/Performance:  Religious Beliefs/Practices: Christian History of Abuse: none Armed forces technical officerccupational Experiences; 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 Legal History: none Hobbies/Interests: Animals gardening painting  Family History:   Family History  Problem Relation Age of Onset  . Bipolar disorder Sister   . Alcohol abuse Maternal Grandfather   . Deep vein thrombosis Neg Hx   . Pulmonary embolism Neg Hx   . Heart disease Neg Hx   . Heart failure Neg Hx     Mental Status Examination/Evaluation: Objective:  Appearance: Casual and Well Groomed    Eye Contact::  Good  Speech:  Clear and Coherent  Volume:  Decreased  Mood: Fairly good   Affect: Bright   Thought Process:  Coherent  Orientation:  Full (Time, Place, and Person)  Thought Content:  Rumination  Suicidal Thoughts:  No  Homicidal Thoughts:  No  Judgement:  Good  Insight:  Good  Psychomotor Activity: Normal   Akathisia:  No  Handed:  Right  AIMS (if indicated):    Assets:  Communication Skills Desire for Improvement Resilience Social Support Talents/Skills    Laboratory/X-Ray Psychological Evaluation(s)   Reviewed in chart      Assessment:  Axis I: Major Depression, Recurrent severe  AXIS I Major Depression, Recurrent severe  AXIS II Deferred  AXIS III Past Medical History:  Diagnosis Date  . Asthma   . Chronic pain began in 2011 after jooint replacements  . Depression   . Reflux      AXIS IV other psychosocial or environmental problems  AXIS V 51-60 moderate symptoms   Treatment Plan/Recommendations:  Plan of Care: Medication management   Laboratory:    Psychotherapy: She is seeing Florencia Reasons here   Medications: She'll continue Wellbutrin and Paxil for depression and gabapentin for anxiety and Abilify for mood stabilization. She'll continue clonazepam to 1 mg daily for breakthrough anxiety.   Routine PRN Medications:  No  Consultations:   Safety Concerns:  She denies current thoughts of self-harm   Other: She'll return in 3 months    Diannia Ruder, MD 11/17/201710:29 AM     Patient ID: Carmela Hurt, female   DOB: 1955-10-13, 60 y.o.   MRN: [id number]

## 2016-01-02 ENCOUNTER — Ambulatory Visit (HOSPITAL_COMMUNITY)
Admission: RE | Admit: 2016-01-02 | Discharge: 2016-01-02 | Disposition: A | Discharge status: Home / Self Care | Payer: 59 | Source: Ambulatory Visit | Attending: Family Medicine | Admitting: Family Medicine

## 2016-01-02 ENCOUNTER — Other Ambulatory Visit (HOSPITAL_COMMUNITY): Payer: Self-pay | Admitting: Family Medicine

## 2016-01-02 DIAGNOSIS — J189 Pneumonia, unspecified organism: Secondary | ICD-10-CM

## 2016-01-02 DIAGNOSIS — F17200 Nicotine dependence, unspecified, uncomplicated: Secondary | ICD-10-CM | POA: Diagnosis not present

## 2016-01-02 DIAGNOSIS — J984 Other disorders of lung: Secondary | ICD-10-CM | POA: Diagnosis not present

## 2016-01-02 DIAGNOSIS — I700 Atherosclerosis of aorta: Secondary | ICD-10-CM | POA: Diagnosis not present

## 2016-01-08 ENCOUNTER — Other Ambulatory Visit (HOSPITAL_COMMUNITY): Payer: Self-pay | Admitting: Family Medicine

## 2016-01-08 DIAGNOSIS — J181 Lobar pneumonia, unspecified organism: Principal | ICD-10-CM

## 2016-01-08 DIAGNOSIS — J189 Pneumonia, unspecified organism: Secondary | ICD-10-CM

## 2016-01-10 ENCOUNTER — Ambulatory Visit (HOSPITAL_COMMUNITY)
Admission: RE | Admit: 2016-01-10 | Discharge: 2016-01-10 | Disposition: A | Discharge status: Home / Self Care | Payer: 59 | Source: Ambulatory Visit | Attending: Family Medicine | Admitting: Family Medicine

## 2016-01-10 DIAGNOSIS — E041 Nontoxic single thyroid nodule: Secondary | ICD-10-CM | POA: Diagnosis not present

## 2016-01-10 DIAGNOSIS — J181 Lobar pneumonia, unspecified organism: Secondary | ICD-10-CM | POA: Insufficient documentation

## 2016-01-10 DIAGNOSIS — J189 Pneumonia, unspecified organism: Secondary | ICD-10-CM

## 2016-01-14 ENCOUNTER — Other Ambulatory Visit (HOSPITAL_COMMUNITY): Payer: Self-pay | Admitting: Family Medicine

## 2016-01-14 DIAGNOSIS — E041 Nontoxic single thyroid nodule: Secondary | ICD-10-CM

## 2016-01-15 MED FILL — clonazePAM 1 MG TABS: 1 | 30 days supply | Qty: 30 | Fill #0

## 2016-01-17 ENCOUNTER — Ambulatory Visit (HOSPITAL_COMMUNITY)
Admission: RE | Admit: 2016-01-17 | Discharge: 2016-01-17 | Disposition: A | Discharge status: Home / Self Care | Payer: 59 | Source: Ambulatory Visit | Attending: Family Medicine | Admitting: Family Medicine

## 2016-01-17 DIAGNOSIS — E042 Nontoxic multinodular goiter: Secondary | ICD-10-CM | POA: Diagnosis not present

## 2016-01-17 DIAGNOSIS — E041 Nontoxic single thyroid nodule: Secondary | ICD-10-CM

## 2016-01-21 MED FILL — PANTOPRAZOLE SOD DR 40 MG T: 40 | 90 days supply | Qty: 90 | Fill #0

## 2016-02-15 MED FILL — traMADol HCL 50 MG TABS: 50 | 90 days supply | Qty: 180 | Fill #1

## 2016-02-18 MED FILL — clonazePAM 1 MG TABS: 1 | 30 days supply | Qty: 30 | Fill #1

## 2016-02-25 MED FILL — BUPROPION HCL XL 300 MG TAB: 300 | 90 days supply | Qty: 90 | Fill #2

## 2016-02-25 MED FILL — PAROXETINE CR 37.5 MG TAB: 37.5 | 30 days supply | Qty: 30 | Fill #2

## 2016-02-26 MED FILL — VENTOLIN HFA 90 MCG INHALER: 108 (90 BAS | 17 days supply | Qty: 18 | Fill #0

## 2016-02-26 MED FILL — GABAPENTIN 300 MG CAPSULE: 300 | 90 days supply | Qty: 180 | Fill #0

## 2016-02-26 MED FILL — rOPINIRole HCL 0.5 MG TABS: 0.5 | 90 days supply | Qty: 90 | Fill #0

## 2016-03-12 ENCOUNTER — Telehealth (HOSPITAL_COMMUNITY): Payer: Self-pay | Admitting: *Deleted

## 2016-03-12 ENCOUNTER — Emergency Department (HOSPITAL_COMMUNITY)
Admission: EM | Admit: 2016-03-12 | Discharge: 2016-03-12 | Disposition: A | Discharge status: Home / Self Care | Payer: Medicare Other | Attending: Emergency Medicine | Admitting: Emergency Medicine

## 2016-03-12 ENCOUNTER — Emergency Department (HOSPITAL_COMMUNITY): Payer: Medicare Other

## 2016-03-12 ENCOUNTER — Encounter (HOSPITAL_COMMUNITY): Payer: Self-pay | Admitting: Emergency Medicine

## 2016-03-12 DIAGNOSIS — J209 Acute bronchitis, unspecified: Secondary | ICD-10-CM | POA: Insufficient documentation

## 2016-03-12 DIAGNOSIS — F17210 Nicotine dependence, cigarettes, uncomplicated: Secondary | ICD-10-CM | POA: Diagnosis not present

## 2016-03-12 DIAGNOSIS — Z79899 Other long term (current) drug therapy: Secondary | ICD-10-CM | POA: Diagnosis not present

## 2016-03-12 DIAGNOSIS — J4521 Mild intermittent asthma with (acute) exacerbation: Secondary | ICD-10-CM | POA: Diagnosis not present

## 2016-03-12 DIAGNOSIS — R0602 Shortness of breath: Secondary | ICD-10-CM | POA: Diagnosis present

## 2016-03-12 MED ORDER — DEXAMETHASONE SODIUM PHOSPHATE 10 MG/ML IJ SOLN
10.0000 mg | Freq: Once | INTRAMUSCULAR | Status: AC
  Administered 2016-03-12: 10 mg via INTRAMUSCULAR
  Filled 2016-03-12: qty 1

## 2016-03-12 MED ORDER — AZITHROMYCIN 250 MG PO TABS: 250.0000 mg | ORAL_TABLET | Freq: Every day | ORAL | 0 refills | Status: DC

## 2016-03-12 MED ORDER — PREDNISONE 10 MG (21) PO TBPK: ORAL_TABLET | ORAL | 0 refills | Status: DC

## 2016-03-12 MED ORDER — IPRATROPIUM-ALBUTEROL 0.5-2.5 (3) MG/3ML IN SOLN
3.0000 mL | Freq: Once | RESPIRATORY_TRACT | Status: AC
  Administered 2016-03-12: 3 mL via RESPIRATORY_TRACT
  Filled 2016-03-12: qty 3

## 2016-03-12 NOTE — ED Provider Notes (Signed)
[facility]Y DEPT Provider Note   CSN: [id number] Arrival date & time: 03/12/16  1102     History   Chief Complaint Chief Complaint  Patient presents with  . Cough    HPI Rebecca [name] is a 61 y.o. female.  Pt presents to the ED today with SOB, wheeze, productive green sputum cough since 2/5.  Pt has taken otc mucinex without relief.  She has also been using her albuterol inhalers and nebulizers without help.  No fevers.  No URI sx.      Past Medical History:  Diagnosis Date  . Asthma   . Chronic pain began in 2011 after jooint replacements  . Depression   . Reflux     Patient Active Problem List   Diagnosis Date Noted  . CAP (community acquired pneumonia) 12/01/2015  . Acute respiratory failure with hypoxia (HCC) 12/01/2015  . Acute asthma exacerbation 12/01/2015  . Lower extremity edema 09/06/2013  . Orthopnea 09/06/2013  . MDD (major depressive disorder) 09/04/2013  . Morbid obesity (HCC) 09/03/2013  . Drug overdose, intentional (HCC) 09/02/2013  . Intentional drug overdose (HCC) 09/02/2013  . Knee pain 09/12/2010  . Difficulty in walking(719.7) 09/12/2010  . Stiffness of joint, not elsewhere classified, lower leg 09/12/2010  . S/P total knee replacement 09/12/2010  . Asthma, chronic     Past Surgical History:  Procedure Laterality Date  . ABDOMINAL HYSTERECTOMY    . FOOT SURGERY  2011 -both feet  . KNEE SURGERY     left knee  . SHOULDER SURGERY  2011    OB History    No data available       Home Medications    Prior to Admission medications   Medication Sig Start Date End Date Taking? Authorizing Provider  albuterol (PROVENTIL HFA;VENTOLIN HFA) 108 (90 BASE) MCG/ACT inhaler Inhale 2 puffs into the lungs every 4 (four) hours as needed for wheezing or shortness of breath. 09/08/13   Sanjuana KavaAgnes I Nwoko, NP  albuterol (PROVENTIL) (2.5 MG/3ML) 0.083% nebulizer solution Take 2.5 mg by nebulization every 4 (four) hours as needed for wheezing or  shortness of breath.    Historical Provider, MD  ARIPiprazole (ABILIFY) 5 MG tablet Take 1 tablet (5 mg total) by mouth daily. For mood control 12/21/15   Myrlene Brokereborah R Ross, MD  azithromycin (ZITHROMAX) 250 MG tablet Take 1 tablet (250 mg total) by mouth daily. Take first 2 tablets together, then 1 every day until finished. 03/12/16   Jacalyn LefevreJulie Jacquetta Polhamus, MD  Budesonide (UCERIS) 9 MG TB24 Take 1 tablet by mouth daily.    Historical Provider, MD  buPROPion (WELLBUTRIN XL) 300 MG 24 hr tablet Take 1 tablet (300 mg total) by mouth every morning. 12/21/15 12/20/16  Myrlene Brokereborah R Ross, MD  clonazePAM (KLONOPIN) 1 MG tablet Take 1 tablet (1 mg total) by mouth at bedtime. 12/21/15 12/20/16  Myrlene Brokereborah R Ross, MD  Fluticasone-Salmeterol (ADVAIR) 250-50 MCG/DOSE AEPB Inhale 1 puff into the lungs 2 (two) times daily.    Historical Provider, MD  gabapentin (NEURONTIN) 300 MG capsule Take 1 capsule (300 mg total) by mouth 2 (two) times daily. For agitation/pain managment 12/21/15   Myrlene Brokereborah R Ross, MD  guaiFENesin-codeine 100-10 MG/5ML syrup Take 10 mLs by mouth every 4 (four) hours as needed for cough. 12/04/15   [place] SirenPeter Le, MD  levofloxacin (LEVAQUIN) 750 MG tablet Take 1 tablet (750 mg total) by mouth daily. 12/04/15   [place] SirenPeter Le, MD  lidocaine (LIDODERM) 5 % Place 1 patch onto the  skin daily as needed (for pain). Remove & Discard patch within 12 hours or as directed by MD    Historical Provider, MD  pantoprazole (PROTONIX) 40 MG tablet Take 1 tablet (40 mg total) by mouth daily. For acid reflux 09/08/13   Sanjuana Kava, NP  PARoxetine (PAXIL CR) 37.5 MG 24 hr tablet Take 1 tablet (37.5 mg total) by mouth every evening. 12/21/15 12/20/16  Myrlene Broker, MD  predniSONE (STERAPRED UNI-PAK 21 TAB) 10 MG (21) TBPK tablet Take 6 tabs by mouth daily  for 2 days, then 5 tabs for 2 days, then 4 tabs for 2 days, then 3 tabs for 2 days, 2 tabs for 2 days, then 1 tab by mouth daily for 2 days 03/12/16   Jacalyn Lefevre, MD  rOPINIRole (REQUIP) 0.5  MG tablet Take 1 tablet (0.5 mg total) by mouth at bedtime. For restless leg syndrome 09/08/13   Sanjuana Kava, NP  traMADol (ULTRAM) 50 MG tablet Take 50 mg by mouth 2 (two) times daily.    Historical Provider, MD    Family History Family History  Problem Relation Age of Onset  . Bipolar disorder Sister   . Alcohol abuse Maternal Grandfather   . Deep vein thrombosis Neg Hx   . Pulmonary embolism Neg Hx   . Heart disease Neg Hx   . Heart failure Neg Hx     Social History Social History  Substance Use Topics  . Smoking status: Current Every Day Smoker    Types: Cigarettes    Last attempt to quit: 09/19/2009  . Smokeless tobacco: Never Used  . Alcohol use No     Allergies   Sulfa antibiotics   Review of Systems Review of Systems  Respiratory: Positive for cough, shortness of breath and wheezing.   All other systems reviewed and are negative.    Physical Exam Updated Vital Signs BP 108/86   Pulse 74   Temp 98.2 F (36.8 C) (Oral)   Resp 22   Ht 5\' 4"  (1.626 m)   Wt 240 lb (108.9 kg)   SpO2 93%   BMI 41.20 kg/m   Physical Exam  Constitutional: She is oriented to person, place, and time. She appears well-developed and well-nourished.  HENT:  Head: Normocephalic and atraumatic.  Right Ear: External ear normal.  Left Ear: External ear normal.  Nose: Nose normal.  Mouth/Throat: Oropharynx is clear and moist.  Eyes: Conjunctivae and EOM are normal. Pupils are equal, round, and reactive to light.  Neck: Normal range of motion. Neck supple.  Cardiovascular: Normal rate, regular rhythm, normal heart sounds and intact distal pulses.   Pulmonary/Chest: Effort normal. She has wheezes.  Abdominal: Soft. Bowel sounds are normal.  Musculoskeletal: Normal range of motion.  Neurological: She is alert and oriented to person, place, and time.  Skin: Skin is warm and dry.  Psychiatric: She has a normal mood and affect. Her behavior is normal. Judgment and thought content  normal.  Nursing note and vitals reviewed.    ED Treatments / Results  Labs (all labs ordered are listed, but only abnormal results are displayed) Labs Reviewed - No data to display  EKG  EKG Interpretation None       Radiology Dg Chest 2 View  Result Date: 03/12/2016 CLINICAL DATA:  Productive cough EXAM: CHEST  2 VIEW COMPARISON:  01/10/2016 FINDINGS: Cardiac shadow is at the upper limits of normal in size. The lungs are clear. No infiltrate or sizable effusion is seen.  No bony abnormality is noted. IMPRESSION: No active cardiopulmonary disease. Electronically Signed   By: Alcide Clever M.D.   On: 03/12/2016 11:40    Procedures Procedures (including critical care time)  Medications Ordered in ED Medications  dexamethasone (DECADRON) injection 10 mg (10 mg Intramuscular Given 03/12/16 1219)  ipratropium-albuterol (DUONEB) 0.5-2.5 (3) MG/3ML nebulizer solution 3 mL (3 mLs Nebulization Given 03/12/16 1246)     Initial Impression / Assessment and Plan / ED Course  I have reviewed the triage vital signs and the nursing notes.  Pertinent labs & imaging results that were available during my care of the patient were reviewed by me and considered in my medical decision making (see chart for details).    Pt feels much better.  She knows to return if worse.  Final Clinical Impressions(s) / ED Diagnoses   Final diagnoses:  Mild intermittent asthma with exacerbation  Acute bronchitis, unspecified organism    New Prescriptions New Prescriptions   AZITHROMYCIN (ZITHROMAX) 250 MG TABLET    Take 1 tablet (250 mg total) by mouth daily. Take first 2 tablets together, then 1 every day until finished.   PREDNISONE (STERAPRED UNI-PAK 21 TAB) 10 MG (21) TBPK TABLET    Take 6 tabs by mouth daily  for 2 days, then 5 tabs for 2 days, then 4 tabs for 2 days, then 3 tabs for 2 days, 2 tabs for 2 days, then 1 tab by mouth daily for 2 days     Jacalyn Lefevre, MD 03/12/16 1320

## 2016-03-12 NOTE — Telephone Encounter (Signed)
Received letter from [employer] stating Paroxetine needed prior authorization. Called [phone] spoke with Annice PihJackie who states a decision will be faxed to office. Case [id number]. Annice PihJackie had a hard time finding medication stating it did not come in 37.5mg , he did not understand that it came in ER form. He was able to find it with spelling letter for letter. It will be sent to pharmacist for review.

## 2016-03-12 NOTE — ED Triage Notes (Signed)
PT c/o SOB with exertion and expiratory wheeze with green sputum cough x2 days. PT states no relief from home inhalers.

## 2016-03-14 ENCOUNTER — Telehealth (HOSPITAL_COMMUNITY): Payer: Self-pay | Admitting: *Deleted

## 2016-03-14 NOTE — Telephone Encounter (Signed)
Prior authorization for Paxil CR received from [employer] stating they needed more information to complete request. Spoke with Carmell AustriaJarrod who took additional information and stated decision will be faxed to office.

## 2016-03-17 ENCOUNTER — Ambulatory Visit (HOSPITAL_COMMUNITY): Payer: Self-pay | Admitting: Psychiatry

## 2016-03-17 ENCOUNTER — Telehealth (HOSPITAL_COMMUNITY): Payer: Self-pay | Admitting: *Deleted

## 2016-03-17 MED FILL — ADVAIR 250/50 DISKUS: 250-50 | 90 days supply | Qty: 180 | Fill #2

## 2016-03-17 NOTE — Telephone Encounter (Signed)
phone call, spoke with patient regarding provider out of office.   She said she will call back to reschedule.

## 2016-03-17 NOTE — Telephone Encounter (Signed)
noted 

## 2016-03-21 ENCOUNTER — Telehealth (HOSPITAL_COMMUNITY): Payer: Self-pay | Admitting: *Deleted

## 2016-03-21 NOTE — Telephone Encounter (Signed)
Denial letter for Paxil CR received. Stating that patient must first try 1 of the following: citalopram hydrobromide, desvenlafaxine, duloxetine, escitalopram, fluoxetine, sertraline, trintellix, benlafaxine ER capsule or tablet, or Viibryd 

## 2016-03-24 NOTE — Telephone Encounter (Signed)
Denial letter for Paxil CR received. Stating that patient must first try 1 of the following: citalopram hydrobromide, desvenlafaxine, duloxetine, escitalopram, fluoxetine, sertraline, trintellix, benlafaxine ER capsule or tablet, or Viibryd

## 2016-03-26 MED FILL — clonazePAM 1 MG TABS: 1 | 30 days supply | Qty: 30 | Fill #2

## 2016-03-27 ENCOUNTER — Telehealth (HOSPITAL_COMMUNITY): Payer: Self-pay | Admitting: *Deleted

## 2016-03-27 NOTE — Telephone Encounter (Signed)
Received denial of Paxil CR stating that patient must first try one of the covered medications such as :Citalopram hydrobromide, desvenlafaxine ER, Duloxetine, Escitalopram, fluoxetine delayed release, sertraline, trintellix, venlafaxine ER or Viibryd or a specific medical reason why the alternative medications are not appropriate to treat the medical condition.

## 2016-03-28 NOTE — Telephone Encounter (Signed)
Per Karie MainlandAli, she received denial of Paxil CR stating that patient must first try one of the covered medications such as :Citalopram hydrobromide, desvenlafaxine ER, Duloxetine, Escitalopram, fluoxetine delayed release, sertraline, trintellix, venlafaxine ER or Viibryd or a specific medical reason why the alternative medications are not appropriate to treat the medical condition.

## 2016-03-31 NOTE — Telephone Encounter (Signed)
She was severely depressed and attempted suicide and this medication has kept her stable and out of the hospital for 2 years

## 2016-03-31 NOTE — Telephone Encounter (Signed)
Per previous message this is Dr. Tenny Crawoss response to pt denial letter. She was severely depressed and attempted suicide and this medication has kept her stable and out of the hospital for 2 years

## 2016-04-01 ENCOUNTER — Ambulatory Visit (INDEPENDENT_AMBULATORY_CARE_PROVIDER_SITE_OTHER): Payer: Self-pay | Admitting: Psychiatry

## 2016-04-01 ENCOUNTER — Encounter (HOSPITAL_COMMUNITY): Payer: Self-pay | Admitting: Psychiatry

## 2016-04-01 VITALS — BP 150/98 | HR 86 | Ht 64.0 in | Wt 249.0 lb

## 2016-04-01 DIAGNOSIS — Z818 Family history of other mental and behavioral disorders: Secondary | ICD-10-CM

## 2016-04-01 DIAGNOSIS — F333 Major depressive disorder, recurrent, severe with psychotic symptoms: Secondary | ICD-10-CM

## 2016-04-01 DIAGNOSIS — Z79899 Other long term (current) drug therapy: Secondary | ICD-10-CM

## 2016-04-01 DIAGNOSIS — Z882 Allergy status to sulfonamides status: Secondary | ICD-10-CM

## 2016-04-01 DIAGNOSIS — Z811 Family history of alcohol abuse and dependence: Secondary | ICD-10-CM

## 2016-04-01 MED ORDER — GABAPENTIN 300 MG PO CAPS: 300.0000 mg | ORAL_CAPSULE | Freq: Two times a day (BID) | ORAL | 2 refills | Status: DC

## 2016-04-01 MED ORDER — PAROXETINE HCL 20 MG PO TABS
20.0000 mg | ORAL_TABLET | Freq: Every day | ORAL | 2 refills | Status: DC
Start: 2016-04-01 — End: 2016-06-27

## 2016-04-01 MED ORDER — CLONAZEPAM 1 MG PO TABS: 1.0000 mg | ORAL_TABLET | Freq: Every day | ORAL | 2 refills | Status: DC

## 2016-04-01 MED ORDER — BUPROPION HCL ER (XL) 300 MG PO TB24: 300.0000 mg | ORAL_TABLET | ORAL | 2 refills | Status: DC

## 2016-04-01 MED FILL — PARoxetine HCL 20 MG TABS: 20 | 90 days supply | Qty: 90 | Fill #0

## 2016-04-01 NOTE — Progress Notes (Signed)
Patient ID: Rebecca HurtKimberly G Adams, female   DOB: [birth year], 61 y.o.   MRN: [id number] Patient ID: Rebecca HurtKimberly G Adams, female   DOB: [birth year], 61 y.o.   MRN: [id number] Patient ID: Rebecca HurtKimberly G Adams, female   DOB: [birth year], 61 y.o.   MRN: [id number] Patient ID: Rebecca HurtKimberly G Adams, female   DOB: [birth year], 61 y.o.   MRN: [id number] Patient ID: Rebecca HurtKimberly G Adams, female   DOB: [birth year], 61 y.o.   MRN: [id number] Patient ID: Rebecca HurtKimberly G Adams, female   DOB: [birth year], 61 y.o.   MRN: [id number] Patient ID: Rebecca HurtKimberly G Adams, female   DOB: [birth year], 61 y.o.   MRN: [id number] Patient ID: Rebecca HurtKimberly G Adams, female   DOB: [birth year], 61 y.o.   MRN: [id number] Patient ID: Rebecca HurtKimberly G Adams, female   DOB: [birth year], 61 y.o.   MRN: [id number] Patient ID: Rebecca HurtKimberly G Adams, female   DOB: [birth year], 61 y.o.   MRN: [id number]  Psychiatric Assessment Adult  Patient Identification:  Rebecca HurtKimberly G Adams Date of Evaluation:  04/01/2016 Chief Complaint: I'm doing better History of Chief Complaint:   Chief Complaint  Patient presents with  . Follow-up    Stopped Abilify due to cost  . Depression  . Anxiety    Depression         Past medical history includes anxiety.   Anxiety  Symptoms include nervous/anxious behavior.     this patient is a 61 year old separated white female who lives alone in [place]. 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 She has one daughter and 2 grandchildren  The patient [facility] where she was hospitalized July 31 through August 6 after an intentional overdose on Ambien and oxycodone. The patient has had one prior overdose attempt in 2004. This occurred after she found that her first husband had had an affair with her sister years prior.  After the hospital patient 2004 the patient was followed for time by [facility] in [place]. She did fairly well on Paxil CR. Most recently she has been treated  [facility] and Abilify was added. Over the last year however she has lost many important people in her life. 2 of her aunt her best friend another friend and her dog all died within a short period of time. She was going downhill and getting more and more depressed and was dealing with that by drinking 2-3 ounces of liquor per day. She got increasingly despondent and eventually decided to take her life and wrote a suicide note to her family. Reportedly she  was found the next morning and brought to the ER in the ICU and consequently to the behavioral health hospital.  While there are Wellbutrin and trazodone were added. The patient wasn't sleeping well prior to hospitalization but she sleeping much better now. Her mood is improved but her energy and motivation are still poor and she's developed tremor in both hands predicted on the left. 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 It's hard for her to type because of the tremor and she can't remember anything. She's no longer suicidal but just doesn't have much energy to do much of anything right now. She's denies auditory visual sensations or paranoia. She is no longer drinking  The patient returns today after 3 months. She is upset today because  she found out that her great nephew who was in the military had killed himself by gunshot yesterday. Overall however she is functioning fairly well. She stopped the Abilify due to cost and it has not affected her mood. Her insurance does not want to cover the Paxil CR but she feels like she could manage just fine on regular Paxil. She is sleeping well and is still working 1 day a week. She still has some tremor in her hands and I suggested she ask her PCP for a neurology consult. Review of Systems  Constitutional: Positive for activity change.  HENT: Negative.   Respiratory: Negative.    Cardiovascular: Negative.   Gastrointestinal: Negative.   Endocrine: Negative.   Genitourinary: Negative.   Musculoskeletal: Positive for arthralgias and joint swelling.  Allergic/Immunologic: Negative.   Neurological: Positive for tremors.  Hematological: Negative.   Psychiatric/Behavioral: Positive for depression and dysphoric mood. The patient is nervous/anxious.    Physical Examnot done  Depressive Symptoms: depressed mood, anhedonia, insomnia, difficulty concentrating, anxiety, loss of energy/fatigue,  (Hypo) Manic Symptoms:   Elevated Mood:  No Irritable Mood:  No Grandiosity:  No Distractibility:  Yes Labiality of Mood:  Yes Delusions:  No Hallucinations:  No Impulsivity:  No Sexually Inappropriate Behavior:  No Financial Extravagance:  No Flight of Ideas:  No  Anxiety Symptoms: Excessive Worry:  Yes Panic Symptoms:  Yes Agoraphobia:  No Obsessive Compulsive: No  Symptoms: None, Specific Phobias:  No Social Anxiety:  Yes  Psychotic Symptoms:  Hallucinations: No None Delusions:  No Paranoia:  No   Ideas of Reference:  No  PTSD Symptoms: Ever had a traumatic exposure:  No Had a traumatic exposure in the last month:  No Re-experiencing: No None Hypervigilance:  No Hyperarousal: No None Avoidance: No None  Traumatic Brain Injury: No   Past Psychiatric History: Diagnosis: Maj. depression   Hospitalizations: In 2004 and again last month   Outpatient Care: At [facility] in the past   Substance Abuse Care: none  Self-Mutilation: none  Suicidal Attempts: Overdose attempts twice   Violent Behaviors:none   Past Medical History:   Past Medical History:  Diagnosis Date  . Asthma   . Chronic pain began in 2011 after jooint replacements  . Depression   . Reflux    History of Loss of Consciousness:  Yes Seizure History:  No Cardiac History:  No Allergies:   Allergies  Allergen Reactions  . Sulfa Antibiotics Other (See Comments)     unknown   Current Medications:  Current Outpatient Prescriptions  Medication Sig Dispense Refill  . albuterol (PROVENTIL HFA;VENTOLIN HFA) 108 (90 BASE) MCG/ACT inhaler Inhale 2 puffs into the lungs every 4 (four) hours as needed for wheezing or shortness of breath.    Marland Kitchen albuterol (PROVENTIL) (2.5 MG/3ML) 0.083% nebulizer solution Take 2.5 mg by nebulization every 4 (four) hours as needed for wheezing or shortness of breath.    . Budesonide (UCERIS) 9 MG TB24 Take 1 tablet by mouth daily.    Marland Kitchen buPROPion (WELLBUTRIN XL) 300 MG 24 hr tablet Take 1 tablet (300 mg total) by mouth every morning. 90 tablet 2  . clonazePAM (KLONOPIN) 1 MG tablet Take 1 tablet (1 mg total) by mouth at bedtime. 30 tablet 2  . Fluticasone-Salmeterol (ADVAIR) 250-50 MCG/DOSE AEPB Inhale 1 puff into the lungs 2 (two) times daily.    Marland Kitchen gabapentin (NEURONTIN) 300 MG capsule Take 1 capsule (300 mg total) by mouth 2 (two) times daily. For agitation/pain managment 180  capsule 2  . lidocaine (LIDODERM) 5 % Place 1 patch onto the skin daily as needed (for pain). Remove & Discard patch within 12 hours or as directed by MD    . pantoprazole (PROTONIX) 40 MG tablet Take 1 tablet (40 mg total) by mouth daily. For acid reflux    . rOPINIRole (REQUIP) 0.5 MG tablet Take 1 tablet (0.5 mg total) by mouth at bedtime. For restless leg syndrome    . traMADol (ULTRAM) 50 MG tablet Take 50 mg by mouth 2 (two) times daily.    Marland Kitchen PARoxetine (PAXIL) 20 MG tablet Take 1 tablet (20 mg total) by mouth daily. 90 tablet 2   No current facility-administered medications for this visit.     Previous Psychotropic Medications:  Medication Dose   Paxil CR, Ambien                        Substance Abuse History in the last 12 months: Substance Age of 1st Use Last Use Amount Specific Type  Nicotine      Alcohol    was drinking 2-3 ounces of liquor per day until about 3 weeks ago    Cannabis      Opiates      Cocaine      Methamphetamines       LSD      Ecstasy      Benzodiazepines      Caffeine      Inhalants      Others:                          Medical Consequences of Substance Abuse: none  Legal Consequences of Substance Abuse: none  Family Consequences of Substance Abuse: none  Blackouts:  No DT's:  No Withdrawal Symptoms:  No None  Social History: Current Place of Residence: [place] [address] of Birth: [place] Family Members: Husband daughter 2 grandchildren Marital Status:  Married Children:   Sons:   Daughters: 1 Relationships: Education:  Corporate treasurer Problems/Performance:  Religious Beliefs/Practices: Christian History of Abuse: none Armed forces technical officer; 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 Legal History: none Hobbies/Interests: Animals gardening painting  Family History:   Family History  Problem Relation Age of Onset  . Bipolar disorder Sister   . Alcohol abuse Maternal Grandfather   . Deep vein thrombosis Neg Hx   . Pulmonary embolism Neg Hx   . Heart disease Neg Hx   . Heart failure Neg Hx     Mental Status Examination/Evaluation: Objective:  Appearance: Casual and Well Groomed    Eye Contact::  Good  Speech:  Clear and Coherent  Volume:  Decreased  Mood: Fairly good   Affect: A bit dysphoric   Thought Process:  Coherent  Orientation:  Full (Time, Place, and Person)  Thought Content:  Rumination  Suicidal Thoughts:  No  Homicidal Thoughts:  No  Judgement:  Good  Insight:  Good  Psychomotor Activity: Normal   Akathisia:  No  Handed:  Right  AIMS (if indicated):    Assets:  Communication Skills Desire for Improvement Resilience Social Support Talents/Skills    Laboratory/X-Ray Psychological Evaluation(s)   Reviewed in chart      Assessment:  Axis I: Major Depression, Recurrent severe  AXIS I Major Depression, Recurrent severe  AXIS II Deferred  AXIS III Past Medical History:  Diagnosis Date  . Asthma   .  Chronic pain began in 2011 after jooint replacements  . Depression   . Reflux      AXIS IV other psychosocial or environmental problems  AXIS V 51-60 moderate symptoms   Treatment Plan/Recommendations:  Plan of Care: Medication management   Laboratory:    Psychotherapy: She is seeing Florencia Reasons here   Medications: She'll continue Wellbutrin and Paxil for depression and gabapentin for anxiety  She'll continue clonazepam to 1 mg daily for breakthrough anxiety. Paxil will be changed from the CR form to 20 mg of regular Paxil daily   Routine PRN Medications:  No  Consultations:   Safety Concerns:  She denies current thoughts of self-harm   Other: She'll return in 3 months    Diannia Ruder, MD 2/27/20184:04 PM     Patient ID: Rebecca [name], female   DOB: 21-Sep-1955, 61 y.o.   MRN: [id number]

## 2016-04-21 MED FILL — PANTOPRAZOLE SOD DR 40 MG T: 40 | 90 days supply | Qty: 90 | Fill #1

## 2016-05-21 MED FILL — clonazePAM 1 MG TABS: 1 | 30 days supply | Qty: 30 | Fill #0

## 2016-05-21 MED FILL — traMADol HCL 50 MG TABS: 50 | 30 days supply | Qty: 60 | Fill #0

## 2016-06-12 MED FILL — BUPROPION HCL XL 300 MG TAB: 300 | 90 days supply | Qty: 90 | Fill #0

## 2016-06-18 MED FILL — ADVAIR 250/50 DISKUS: 250-50 | 90 days supply | Qty: 180 | Fill #3

## 2016-06-18 MED FILL — clonazePAM 1 MG TABS: 1 | 30 days supply | Qty: 30 | Fill #1

## 2016-06-19 MED FILL — traMADol HCL 50 MG TABS: 50 | 30 days supply | Qty: 60 | Fill #0

## 2016-06-27 ENCOUNTER — Ambulatory Visit (INDEPENDENT_AMBULATORY_CARE_PROVIDER_SITE_OTHER): Payer: 59 | Admitting: Psychiatry

## 2016-06-27 ENCOUNTER — Encounter (HOSPITAL_COMMUNITY): Payer: Self-pay | Admitting: Psychiatry

## 2016-06-27 VITALS — BP 140/90 | HR 89 | Ht 64.0 in | Wt 252.2 lb

## 2016-06-27 DIAGNOSIS — F333 Major depressive disorder, recurrent, severe with psychotic symptoms: Secondary | ICD-10-CM

## 2016-06-27 DIAGNOSIS — Z811 Family history of alcohol abuse and dependence: Secondary | ICD-10-CM | POA: Diagnosis not present

## 2016-06-27 DIAGNOSIS — Z818 Family history of other mental and behavioral disorders: Secondary | ICD-10-CM

## 2016-06-27 MED ORDER — PAROXETINE HCL 40 MG PO TABS: 40.0000 mg | ORAL_TABLET | Freq: Every day | ORAL | 2 refills | Status: DC

## 2016-06-27 MED ORDER — CLONAZEPAM 1 MG PO TABS: 1.0000 mg | ORAL_TABLET | Freq: Every day | ORAL | 2 refills | Status: DC

## 2016-06-27 MED ORDER — GABAPENTIN 300 MG PO CAPS: 300.0000 mg | ORAL_CAPSULE | Freq: Two times a day (BID) | ORAL | 2 refills | Status: DC

## 2016-06-27 MED ORDER — BUPROPION HCL ER (XL) 300 MG PO TB24: 300.0000 mg | ORAL_TABLET | ORAL | 2 refills | Status: DC

## 2016-06-27 MED FILL — PARoxetine HCL 40 MG TABS: 40 | 30 days supply | Qty: 30 | Fill #0

## 2016-06-27 MED FILL — GABAPENTIN 300 MG CAPSULE: 300 | 90 days supply | Qty: 180 | Fill #0

## 2016-06-27 NOTE — Progress Notes (Signed)
Patient ID: Rebecca [name], female   DOB: 04/14/1955, 61 y.o.   MRN: [id number] Patient ID: Rebecca [name], female   DOB: 1956-02-03, 61 y.o.   MRN: [id number] Patient ID: Rebecca [name], female   DOB: 1955-09-13, 61 y.o.   MRN: [id number] Patient ID: Rebecca [name], female   DOB: October 04, 1955, 61 y.o.   MRN: [id number] Patient ID: Rebecca [name], female   DOB: 12-11-1955, 61 y.o.   MRN: [id number] Patient ID: Rebecca [name], female   DOB: [birth year], 61 y.o.   MRN: [id number] Patient ID: Rebecca [name], female   DOB: Mar 01, 1955, 61 y.o.   MRN: [id number] Patient ID: Rebecca [name], female   DOB: January 05, 1956, 61 y.o.   MRN: [id number] Patient ID: Rebecca [name], female   DOB: 02-06-1955, 61 y.o.   MRN: [id number] Patient ID: Rebecca [name], female   DOB: 1955/10/23, 61 y.o.   MRN: [id number]  Psychiatric Assessment Adult  Patient Identification:  Rebecca [name] Date of Evaluation:  06/27/2016 Chief Complaint: I'm doing better History of Chief Complaint:   Chief Complaint  Patient presents with  . Depression    Depression         Past medical history includes anxiety.   Anxiety  Symptoms include nervous/anxious behavior.     this patient is a 61 year old separated white female who lives alone in [place]. 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 She has one daughter and 2 grandchildren  The patient was referred by the [facility] where she was hospitalized July 31 through August 6 after an intentional overdose on Ambien and oxycodone. The patient has had one prior overdose attempt in 2004. This occurred after she found that her first husband had had an affair with her sister years prior.  After the hospital patient 2004 the patient was followed for time by [facility] in [place]. She did fairly well on Paxil CR. Most recently she has been treated [facility] primary care and Abilify was  added. Over the last year however she has lost many important people in her life. 2 of her aunt her best friend another friend and her dog all died within a short period of time. She was going downhill and getting more and more depressed and was dealing with that by drinking 2-3 ounces of liquor per day. She got increasingly despondent and eventually decided to take her life and wrote a suicide note to her family. Reportedly she  was found the next morning and brought to the ER in the ICU and consequently to the behavioral health hospital.  While there are Wellbutrin and trazodone were added. The patient wasn't sleeping well prior to hospitalization but she sleeping much better now. Her mood is improved but her energy and motivation are still poor and she's developed tremor in both hands predicted on the left. 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 It's hard for her to type because of the tremor and she can't remember anything. She's no longer suicidal but just doesn't have much energy to do much of anything right now. She's denies auditory visual sensations or paranoia. She is no longer drinking  The patient returns today after 3 months. She is upset today because her daughter has cut off communication with her. She also is not letting her  see her 2 grandchildren. The patient states that the daughter's husband is older and very controlling. They don't get along and he has forbade the daughter from contacting the patient. She sees them at church but they won't sit with her and they refused to have a meeting to discuss this with the pastor. The patient is trying to stay busy with friends her own parents church activities and her job. She has been more depressed and crying but not suicidal. She would like to do more counseling but can't afford it right now. She is going to try talking to her pastor at  church. I suggested we increase the Paxil and she agrees Review of Systems  Constitutional: Positive for activity change.  HENT: Negative.   Respiratory: Negative.   Cardiovascular: Negative.   Gastrointestinal: Negative.   Endocrine: Negative.   Genitourinary: Negative.   Musculoskeletal: Positive for arthralgias and joint swelling.  Allergic/Immunologic: Negative.   Neurological: Positive for tremors.  Hematological: Negative.   Psychiatric/Behavioral: Positive for depression and dysphoric mood. The patient is nervous/anxious.    Physical Examnot done  Depressive Symptoms: depressed mood, anhedonia, insomnia, difficulty concentrating, anxiety, loss of energy/fatigue,  (Hypo) Manic Symptoms:   Elevated Mood:  No Irritable Mood:  No Grandiosity:  No Distractibility:  Yes Labiality of Mood:  Yes Delusions:  No Hallucinations:  No Impulsivity:  No Sexually Inappropriate Behavior:  No Financial Extravagance:  No Flight of Ideas:  No  Anxiety Symptoms: Excessive Worry:  Yes Panic Symptoms:  Yes Agoraphobia:  No Obsessive Compulsive: No  Symptoms: None, Specific Phobias:  No Social Anxiety:  Yes  Psychotic Symptoms:  Hallucinations: No None Delusions:  No Paranoia:  No   Ideas of Reference:  No  PTSD Symptoms: Ever had a traumatic exposure:  No Had a traumatic exposure in the last month:  No Re-experiencing: No None Hypervigilance:  No Hyperarousal: No None Avoidance: No None  Traumatic Brain Injury: No   Past Psychiatric History: Diagnosis: Maj. depression   Hospitalizations: In 2004 and again last month   Outpatient Care: At [facility] in the past   Substance Abuse Care: none  Self-Mutilation: none  Suicidal Attempts: Overdose attempts twice   Violent Behaviors:none   Past Medical History:   Past Medical History:  Diagnosis Date  . Asthma   . Chronic pain began in 2011 after jooint replacements  . Depression   . Reflux    History of  Loss of Consciousness:  Yes Seizure History:  No Cardiac History:  No Allergies:   Allergies  Allergen Reactions  . Sulfa Antibiotics Other (See Comments)    unknown   Current Medications:  Current Outpatient Prescriptions  Medication Sig Dispense Refill  . albuterol (PROVENTIL HFA;VENTOLIN HFA) 108 (90 BASE) MCG/ACT inhaler Inhale 2 puffs into the lungs every 4 (four) hours as needed for wheezing or shortness of breath.    Marland Kitchen. albuterol (PROVENTIL) (2.5 MG/3ML) 0.083% nebulizer solution Take 2.5 mg by nebulization every 4 (four) hours as needed for wheezing or shortness of breath.    Marland Kitchen. buPROPion (WELLBUTRIN XL) 300 MG 24 hr tablet Take 1 tablet (300 mg total) by mouth every morning. 90 tablet 2  . clonazePAM (KLONOPIN) 1 MG tablet Take 1 tablet (1 mg total) by mouth at bedtime. 30 tablet 2  . Fluticasone-Salmeterol (ADVAIR) 250-50 MCG/DOSE AEPB Inhale 1 puff into the lungs 2 (two) times daily.    Marland Kitchen. gabapentin (NEURONTIN) 300 MG capsule Take 1 capsule (300 mg total) by mouth 2 (  two) times daily. For agitation/pain managment 180 capsule 2  . lidocaine (LIDODERM) 5 % Place 1 patch onto the skin daily as needed (for pain). Remove & Discard patch within 12 hours or as directed by MD    . pantoprazole (PROTONIX) 40 MG tablet Take 1 tablet (40 mg total) by mouth daily. For acid reflux    . rOPINIRole (REQUIP) 0.5 MG tablet Take 1 tablet (0.5 mg total) by mouth at bedtime. For restless leg syndrome    . traMADol (ULTRAM) 50 MG tablet Take 50 mg by mouth 2 (two) times daily.    Marland Kitchen PARoxetine (PAXIL) 40 MG tablet Take 1 tablet (40 mg total) by mouth daily. 30 tablet 2   No current facility-administered medications for this visit.     Previous Psychotropic Medications:  Medication Dose   Paxil CR, Ambien                        Substance Abuse History in the last 12 months: Substance Age of 1st Use Last Use Amount Specific Type  Nicotine      Alcohol    was drinking 2-3 ounces of liquor  per day until about 3 weeks ago    Cannabis      Opiates      Cocaine      Methamphetamines      LSD      Ecstasy      Benzodiazepines      Caffeine      Inhalants      Others:                          Medical Consequences of Substance Abuse: none  Legal Consequences of Substance Abuse: none  Family Consequences of Substance Abuse: none  Blackouts:  No DT's:  No Withdrawal Symptoms:  No None  Social History: Current Place of Residence: [place] [address] of Birth: [place] Family Members: Husband daughter 2 grandchildren Marital Status:  Married Children:   Sons:   Daughters: 1 Relationships: Education:  Corporate treasurer Problems/Performance:  Religious Beliefs/Practices: Christian History of Abuse: none Armed forces technical officer; 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 Legal History: none Hobbies/Interests: Animals gardening painting  Family History:   Family History  Problem Relation Age of Onset  . Bipolar disorder Sister   . Alcohol abuse Maternal Grandfather   . Deep vein thrombosis Neg Hx   . Pulmonary embolism Neg Hx   . Heart disease Neg Hx   . Heart failure Neg Hx     Mental Status Examination/Evaluation: Objective:  Appearance: Casual and Well Groomed    Eye Contact::  Good  Speech:  Clear and Coherent  Volume:  Decreased  Mood: Depressed   Affect: dysphoric And tearful   Thought Process:  Coherent  Orientation:  Full (Time, Place, and Person)  Thought Content:  Rumination  Suicidal Thoughts:  No  Homicidal Thoughts:  No  Judgement:  Good  Insight:  Good  Psychomotor Activity: Normal   Akathisia:  No  Handed:  Right  AIMS (if indicated):    Assets:  Communication Skills Desire for Improvement Resilience Social Support Talents/Skills    Laboratory/X-Ray Psychological Evaluation(s)   Reviewed in chart      Assessment:  Axis I: Major Depression, Recurrent severe  AXIS I Major Depression,  Recurrent severe  AXIS II Deferred  AXIS III Past Medical History:  Diagnosis Date  .  Asthma   . Chronic pain began in 2011 after jooint replacements  . Depression   . Reflux      AXIS IV other psychosocial or environmental problems  AXIS V 51-60 moderate symptoms   Treatment Plan/Recommendations:  Plan of Care: Medication management   Laboratory:    Psychotherapy: She is seeing Florencia Reasons here   Medications: She'll continue Wellbutrin and Paxil for depression and gabapentin for anxiety  She'll continue clonazepam to 1 mg daily for breakthrough anxiety. Paxil will be Increased from 20-40 mg daily   Routine PRN Medications:  No  Consultations:   Safety Concerns:  She denies current thoughts of self-harm   Other: She'll return in 2 months     Diannia Ruder, MD 5/25/201811:31 AM     Patient ID: Carmela Hurt, female   DOB: Oct 25, 1955, 61 y.o.   MRN: [id number]

## 2016-07-07 MED FILL — rOPINIRole HCL 0.5 MG TABS: 0.5 | 90 days supply | Qty: 90 | Fill #1

## 2016-07-23 MED FILL — PANTOPRAZOLE SOD DR 40 MG T: 40 | 90 days supply | Qty: 90 | Fill #2

## 2016-07-24 MED FILL — traMADol HCL 50 MG TABS: 50 | 30 days supply | Qty: 60 | Fill #0

## 2016-07-24 MED FILL — clonazePAM 1 MG TABS: 1 | 30 days supply | Qty: 30 | Fill #2

## 2016-08-01 MED FILL — PROAIR HFA 90 MCG INHALER: 108 (90 BAS | 16 days supply | Qty: 9 | Fill #0

## 2016-08-25 ENCOUNTER — Ambulatory Visit (INDEPENDENT_AMBULATORY_CARE_PROVIDER_SITE_OTHER): Payer: 59 | Admitting: Psychiatry

## 2016-08-25 ENCOUNTER — Encounter (HOSPITAL_COMMUNITY): Payer: Self-pay | Admitting: Psychiatry

## 2016-08-25 VITALS — BP 150/100 | HR 84 | Ht 64.0 in | Wt 254.0 lb

## 2016-08-25 DIAGNOSIS — Z818 Family history of other mental and behavioral disorders: Secondary | ICD-10-CM

## 2016-08-25 DIAGNOSIS — F333 Major depressive disorder, recurrent, severe with psychotic symptoms: Secondary | ICD-10-CM

## 2016-08-25 DIAGNOSIS — Z811 Family history of alcohol abuse and dependence: Secondary | ICD-10-CM | POA: Diagnosis not present

## 2016-08-25 MED ORDER — BUPROPION HCL ER (XL) 300 MG PO TB24: 300.0000 mg | ORAL_TABLET | ORAL | 2 refills | Status: DC

## 2016-08-25 MED ORDER — CLONAZEPAM 1 MG PO TABS: 1.0000 mg | ORAL_TABLET | Freq: Every day | ORAL | 2 refills | Status: DC

## 2016-08-25 MED ORDER — FLUOXETINE HCL 40 MG PO CAPS: 40.0000 mg | ORAL_CAPSULE | Freq: Every day | ORAL | 2 refills | Status: DC

## 2016-08-25 MED ORDER — GABAPENTIN 300 MG PO CAPS: 300.0000 mg | ORAL_CAPSULE | Freq: Two times a day (BID) | ORAL | 2 refills | Status: DC

## 2016-08-25 MED FILL — FLUoxetine HCL 40 MG CAPS: 40 | 90 days supply | Qty: 90 | Fill #0

## 2016-08-25 MED FILL — BUPROPION HCL XL 300 MG TAB: 300 | 90 days supply | Qty: 90 | Fill #0

## 2016-08-25 NOTE — Progress Notes (Signed)
Patient ID: Rebecca [name], female   DOB: 08/14/1955, 61 y.o.   MRN: [id number] Patient ID: Rebecca [name], female   DOB: 12-Nov-1955, 61 y.o.   MRN: [id number] Patient ID: Rebecca [name], female   DOB: 04/09/1955, 61 y.o.   MRN: [id number] Patient ID: Rebecca [name], female   DOB: 1955-03-16, 61 y.o.   MRN: [id number] Patient ID: Rebecca [name], female   DOB: 27-Sep-1955, 61 y.o.   MRN: [id number] Patient ID: Rebecca [name], female   DOB: 07/09/1955, 61 y.o.   MRN: [id number] Patient ID: Rebecca [name], female   DOB: [birth year], 61 y.o.   MRN: [id number] Patient ID: Rebecca [name], female   DOB: Dec 17, 1955, 61 y.o.   MRN: [id number] Patient ID: Rebecca [name], female   DOB: 1955/06/27, 61 y.o.   MRN: [id number] Patient ID: Rebecca [name], female   DOB: 03-09-1955, 61 y.o.   MRN: [id number]  Psychiatric Assessment Adult  Patient Identification:  Rebecca [name] Date of Evaluation:  08/25/2016 Chief Complaint: I'm doing better History of Chief Complaint:   Chief Complaint  Patient presents with  . Depression  . Anxiety  . Follow-up    Depression         Past medical history includes anxiety.   Anxiety  Symptoms include nervous/anxious behavior.     this patient is a 61 year old separated white female who lives alone in [place]. 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 She has one daughter and 2 grandchildren  The patient was referred by the [facility] where she was hospitalized July 31 through August 6 after an intentional overdose on Ambien and oxycodone. The patient has had one prior overdose attempt in 2004. This occurred after she found that her first husband had had an affair with her sister years prior.  After the hospital patient 2004 the patient was followed for time by [facility] in [place]. She did fairly well on Paxil CR. Most recently she has been treated [facility] group primary  care and Abilify was added. Over the last year however she has lost many important people in her life. 2 of her aunt her best friend another friend and her dog all died within a short period of time. She was going downhill and getting more and more depressed and was dealing with that by drinking 2-3 ounces of liquor per day. She got increasingly despondent and eventually decided to take her life and wrote a suicide note to her family. Reportedly she  was found the next morning and brought to the ER in the ICU and consequently to the behavioral health hospital.  While there are Wellbutrin and trazodone were added. The patient wasn't sleeping well prior to hospitalization but she sleeping much better now. Her mood is improved but her energy and motivation are still poor and she's developed tremor in both hands predicted on the left. 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 It's hard for her to type because of the tremor and she can't remember anything. She's no longer suicidal but just doesn't have much energy to do much of anything right now. She's denies auditory visual sensations or paranoia. She is no longer drinking  The patient returns today after 2 months. Her daughter still is not communicating with her and this is gotten her  very depressed and distraught. Last week she had a very bad day and was thinking of harming herself but didn't. Instead she went to visit her parents and this helped. She's tried everything to try to get her daughter to respond to her but she thinks the daughter's husband is controlling her behavior. The increase in Paxil didn't help. She cut get her daughter off of her mind. I suggested we switch to Prozac which probably helps more with OCD symptoms. She also agrees to come back to see Florencia Reasons for counseling. She denies suicidal ideation today Review of Systems   Constitutional: Positive for activity change.  HENT: Negative.   Respiratory: Negative.   Cardiovascular: Negative.   Gastrointestinal: Negative.   Endocrine: Negative.   Genitourinary: Negative.   Musculoskeletal: Positive for arthralgias and joint swelling.  Allergic/Immunologic: Negative.   Neurological: Positive for tremors.  Hematological: Negative.   Psychiatric/Behavioral: Positive for depression and dysphoric mood. The patient is nervous/anxious.    Physical Examnot done  Depressive Symptoms: depressed mood, anhedonia, insomnia, difficulty concentrating, anxiety, loss of energy/fatigue,  (Hypo) Manic Symptoms:   Elevated Mood:  No Irritable Mood:  No Grandiosity:  No Distractibility:  Yes Labiality of Mood:  Yes Delusions:  No Hallucinations:  No Impulsivity:  No Sexually Inappropriate Behavior:  No Financial Extravagance:  No Flight of Ideas:  No  Anxiety Symptoms: Excessive Worry:  Yes Panic Symptoms:  Yes Agoraphobia:  No Obsessive Compulsive: No  Symptoms: None, Specific Phobias:  No Social Anxiety:  Yes  Psychotic Symptoms:  Hallucinations: No None Delusions:  No Paranoia:  No   Ideas of Reference:  No  PTSD Symptoms: Ever had a traumatic exposure:  No Had a traumatic exposure in the last month:  No Re-experiencing: No None Hypervigilance:  No Hyperarousal: No None Avoidance: No None  Traumatic Brain Injury: No   Past Psychiatric History: Diagnosis: Maj. depression   Hospitalizations: In 2004 and again last month   Outpatient Care: At [facility] past   Substance Abuse Care: none  Self-Mutilation: none  Suicidal Attempts: Overdose attempts twice   Violent Behaviors:none   Past Medical History:   Past Medical History:  Diagnosis Date  . Asthma   . Chronic pain began in 2011 after jooint replacements  . Depression   . Reflux    History of Loss of Consciousness:  Yes Seizure History:  No Cardiac History:   No Allergies:   Allergies  Allergen Reactions  . Sulfa Antibiotics Other (See Comments)    unknown   Current Medications:  Current Outpatient Prescriptions  Medication Sig Dispense Refill  . albuterol (PROVENTIL HFA;VENTOLIN HFA) 108 (90 BASE) MCG/ACT inhaler Inhale 2 puffs into the lungs every 4 (four) hours as needed for wheezing or shortness of breath.    Marland Kitchen albuterol (PROVENTIL) (2.5 MG/3ML) 0.083% nebulizer solution Take 2.5 mg by nebulization every 4 (four) hours as needed for wheezing or shortness of breath.    Marland Kitchen buPROPion (WELLBUTRIN XL) 300 MG 24 hr tablet Take 1 tablet (300 mg total) by mouth every morning. 90 tablet 2  . clonazePAM (KLONOPIN) 1 MG tablet Take 1 tablet (1 mg total) by mouth at bedtime. 30 tablet 2  . Fluticasone-Salmeterol (ADVAIR) 250-50 MCG/DOSE AEPB Inhale 1 puff into the lungs 2 (two) times daily.    Marland Kitchen gabapentin (NEURONTIN) 300 MG capsule Take 1 capsule (300 mg total) by mouth 2 (two) times daily. For agitation/pain managment 180 capsule 2  . lidocaine (LIDODERM) 5 % Place  1 patch onto the skin daily as needed (for pain). Remove & Discard patch within 12 hours or as directed by MD    . pantoprazole (PROTONIX) 40 MG tablet Take 1 tablet (40 mg total) by mouth daily. For acid reflux    . rOPINIRole (REQUIP) 0.5 MG tablet Take 1 tablet (0.5 mg total) by mouth at bedtime. For restless leg syndrome    . traMADol (ULTRAM) 50 MG tablet Take 50 mg by mouth 2 (two) times daily.    Marland Kitchen FLUoxetine (PROZAC) 40 MG capsule Take 1 capsule (40 mg total) by mouth daily. 90 capsule 2   No current facility-administered medications for this visit.     Previous Psychotropic Medications:  Medication Dose   Paxil CR, Ambien                        Substance Abuse History in the last 12 months: Substance Age of 1st Use Last Use Amount Specific Type  Nicotine      Alcohol    was drinking 2-3 ounces of liquor per day until about 3 weeks ago    Cannabis      Opiates       Cocaine      Methamphetamines      LSD      Ecstasy      Benzodiazepines      Caffeine      Inhalants      Others:                          Medical Consequences of Substance Abuse: none  Legal Consequences of Substance Abuse: none  Family Consequences of Substance Abuse: none  Blackouts:  No DT's:  No Withdrawal Symptoms:  No None  Social History: Current Place of Residence: [place] [address] of Birth: [place] Family Members: Husband daughter 2 grandchildren Marital Status:  Married Children:   Sons:   Daughters: 1 Relationships: Education:  Corporate treasurer Problems/Performance:  Religious Beliefs/Practices: Christian History of Abuse: none Armed forces technical officer; 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 Legal History: none Hobbies/Interests: Animals gardening painting  Family History:   Family History  Problem Relation Age of Onset  . Bipolar disorder Sister   . Alcohol abuse Maternal Grandfather   . Deep vein thrombosis Neg Hx   . Pulmonary embolism Neg Hx   . Heart disease Neg Hx   . Heart failure Neg Hx     Mental Status Examination/Evaluation: Objective:  Appearance: Casual and Well Groomed    Eye Contact::  Good  Speech:  Clear and Coherent  Volume:  Decreased  Mood: Depressed   Affect: dysphoric   Thought Process:  Coherent  Orientation:  Full (Time, Place, and Person)  Thought Content:  Rumination  Suicidal Thoughts:  No  Homicidal Thoughts:  No  Judgement:  Good  Insight:  Good  Psychomotor Activity: Normal   Akathisia:  No  Handed:  Right  AIMS (if indicated):    Assets:  Communication Skills Desire for Improvement Resilience Social Support Talents/Skills    Laboratory/X-Ray Psychological Evaluation(s)   Reviewed in chart      Assessment:  Axis I: Major Depression, Recurrent severe  AXIS I Major Depression, Recurrent severe  AXIS II Deferred  AXIS III Past Medical History:   Diagnosis Date  . Asthma   . Chronic pain began in 2011 after jooint replacements  . Depression   .  Reflux      AXIS IV other psychosocial or environmental problems  AXIS V 51-60 moderate symptoms   Treatment Plan/Recommendations:  Plan of Care: Medication management   Laboratory:    Psychotherapy: She is seeing Florencia ReasonsPeggy Bynum here   Medications: She'll continue Wellbutrin and  for depression and gabapentin for anxiety  She'll continue clonazepam to 1 mg daily for breakthrough anxiety.Paxil will be discontinued and she will start Prozac 40 mg daily   Routine PRN Medications:  No  Consultations:   Safety Concerns:  She denies current thoughts of self-harm   Other: She'll return in 4 weeks     Diannia RuderOSS, Rickiya Picariello, MD 7/23/201810:44 AM     Patient ID: Rebecca [name], female   DOB: [birth year], 61 y.o.   MRN: [id number]

## 2016-08-28 MED FILL — traMADol HCL 50 MG TABS: 50 | 90 days supply | Qty: 180 | Fill #0

## 2016-09-08 ENCOUNTER — Encounter (HOSPITAL_COMMUNITY): Payer: Self-pay | Admitting: Psychiatry

## 2016-09-08 ENCOUNTER — Telehealth (HOSPITAL_COMMUNITY): Payer: Self-pay | Admitting: *Deleted

## 2016-09-08 ENCOUNTER — Ambulatory Visit (INDEPENDENT_AMBULATORY_CARE_PROVIDER_SITE_OTHER): Payer: 59 | Admitting: Psychiatry

## 2016-09-08 DIAGNOSIS — F333 Major depressive disorder, recurrent, severe with psychotic symptoms: Secondary | ICD-10-CM

## 2016-09-08 NOTE — Telephone Encounter (Signed)
Office received fax from [facility] requesting refills for pt Bupropion XL and Clonazepam and Fluoxetine. Pt medications were last filled on 08-25-2016 and sent to [facility] with 90 caps 2 refills for Prozac, 90 tabs 2 refills for Bupropion XL and printed 30 tabs 2 refills for Clonazepam. [facility] number is [phone] and fax number is [phone]. Called pt to verify pharmacy and staff unable to reach pt and lmtcb. Office number provided on pt voicemail.

## 2016-09-08 NOTE — Progress Notes (Signed)
Patient:  Rebecca [name]   DOB: [birth year]  MR Number: [id number]  Location: [facility]:  [address] ,  [place], [id number]  Start: Monday 09/08/2016 11:12 AM  End: Monday 09/08/2016 12:00 PM  Provider/Observer:     Florencia ReasonsPeggy Bynum, MSW, LCSW   Chief Complaint:      Chief Complaint  Patient presents with  . Depression    Reason For Service:     Rebecca [name] is a 61 year old female who has been seen in this practice since August 2015. She [facility] where she was treated from 09/04/2013 through 09/08/2013 for depression and a suicide attempt. She last was seen by this clinician in July 2016 but has continued to see psychiatrist Dr. Tenny Crawoss for medication management. She is resuming services today per Dr. Tenny Crawoss is recommendation due to increased symptoms of depression. She reports having suicidal thoughts about 2-1/2 weeks ago due to estrangement from the relationship with her only child, her 60 year old daughter. Per patient's report, she and daughter had conflict in December 2017 and patient has not seen her or her grandchildren since that time. She sentt daughter a text message when she was feeling suicidal. Her daughter did not respond to patient but contact the local Sheriff's office. She also contacted patient's parents who have tried to talk to patient's daughter on her behalf. However, daughter still refuses to have contact.  Patient reports daughter's husband is very controlling and suspects he is influencing her daughter's behavior.  Interventions Strategy:  Supportive  Participation Level:   Active  Participation Quality:  Appropriate      Behavioral Observation:  Casual, Alert, and Appropriate, Depressed and Tearful.   Current Psychosocial Factors: Lack of contact with daughter and grandsons since 01/2016  Content of Session:   Reestablished rapport, identify and  discussed stressors, facilitated expression of thoughts  and feelings,assisted patient identify current support system and ways to use, discussed the importance of behavioral activation and assisted patient identify ways to increase involvement in activity  Current Status:   Depressed mood, tearfulness, isolative behaviors, excessive worry, loss of interest in activities, fatigue, ruminating thoughts  Suicidal/homicidal:   No. Patient denies current suicidal ideations but reports she last had suicidal ideations about 2 weeks ago. Patient agrees to call this practice, call 911, or have someone take her to the emergency room should symptoms worsen.  Patient Progress:   Fair. Patient reports starting to feel a little better since her last appointment with psychiatrist Dr. Tenny Crawoss. She denies having any suicidal ideations since that time. She reports her parents have been very supportive along with some of her friends. She reports manage and a lot of her issues while staying with her parents 2 weeks ago and realizing she is going to have to accept her current relationship with her daughter may never change.  Target Goals:   1. Reestablished rapport. 2. Increase behavioral activation to overcome symptoms of depression  Last Reviewed:     Goals Addressed Today:    1, 2,  Plan:   Return in 2 weeks  Impression/Diagnosis:   Patient reports the long-standing history of symptoms of depression and had a major depressive episode in 2016 resulting in hospitalization due to a suicide attempt. Patient has been managing symptoms fairly well but symptoms worsened about 2-1/2 weeks ago when she experienced suicidal ideations. This appears to have been triggered by lack of contact with her daughter since December 2017. She and daughter had  been very close. Patient also has not been allowed to see her 2 young grandsons since December 2017. Patient's current symptoms include fepressed mood, tearfulness, isolative behaviors, excessive worry, loss of interest in activities, fatigue,  and ruminating thoughts   Diagnosis:  Axis I: Major depressive disorder, recurrent, severe          Axis II: Deferred

## 2016-09-09 ENCOUNTER — Telehealth (HOSPITAL_COMMUNITY): Payer: Self-pay | Admitting: Psychiatry

## 2016-09-09 NOTE — Telephone Encounter (Signed)
noted 

## 2016-09-09 NOTE — Telephone Encounter (Signed)
I'm not sure which pharmacy she wants, she will need to tell you

## 2016-09-11 MED FILL — clonazePAM 1 MG TABS: 1 | 30 days supply | Qty: 30 | Fill #0

## 2016-09-15 ENCOUNTER — Telehealth (HOSPITAL_COMMUNITY): Payer: Self-pay | Admitting: *Deleted

## 2016-09-15 NOTE — Telephone Encounter (Signed)
Prior authorization received for Clonazepam. Submitted online with cover my meds. Awaiting response.

## 2016-09-16 NOTE — Telephone Encounter (Signed)
Office received fax from [facility] stating pt medication is on ppt plan's list of covered drugs. PA is not required at this time. Fax also states if pharmacy has questions regarding the processing of pt prescription to call [facility] help desk at [phone].

## 2016-09-22 ENCOUNTER — Ambulatory Visit (INDEPENDENT_AMBULATORY_CARE_PROVIDER_SITE_OTHER): Payer: 59 | Admitting: Psychiatry

## 2016-09-22 ENCOUNTER — Encounter (HOSPITAL_COMMUNITY): Payer: Self-pay | Admitting: Psychiatry

## 2016-09-22 DIAGNOSIS — F333 Major depressive disorder, recurrent, severe with psychotic symptoms: Secondary | ICD-10-CM | POA: Diagnosis not present

## 2016-09-22 NOTE — Progress Notes (Signed)
Patient:  Rebecca [name]   DOB: March 19, 1955  MR Number: [id number]  Location: [facility] Center:  [address] [place],  [place], [address]  Start: Monday 09/22/2016 10:04 AM  End: Monday 09/22/2016 10:59 AM   Provider/Observer:     Florencia Reasons, MSW, LCSW   Chief Complaint:      Chief Complaint  Patient presents with  . Depression    Reason For Service:     Rebecca [name] is a 61 year old female who has been seen in this practice since August 2015. She initially was referred from the [facility] where she was treated from 09/04/2013 through 09/08/2013 for depression and a suicide attempt. She last was seen by this clinician in July 2016 but has continued to see psychiatrist Dr. Tenny [name] for medication management. She is resuming services today per Dr. Tenny [name] is recommendation due to increased symptoms of depression. She reports having suicidal thoughts about 2-1/2 weeks ago due to estrangement from the relationship with her only child, her 75 year old daughter. Per patient's report, she and daughter had conflict in December 2017 and patient has not seen her or her grandchildren since that time. She sentt daughter a text message when she was feeling suicidal. Her daughter did not respond to patient but contact the local Sheriff's office. She also contacted patient's parents who have tried to talk to patient's daughter on her behalf. However, daughter still refuses to have contact.  Patient reports daughter's husband is very controlling and suspects he is influencing her daughter's behavior.  Interventions Strategy:  Supportive  Participation Level:   Active  Participation Quality:  Appropriate      Behavioral Observation:  Casual, Alert, and Appropriate, Depressed and Tearful.   Current Psychosocial Factors: Lack of contact with daughter and grandsons since 01/2016  Content of Session:   Reviewed symptoms, facilitated expression of thoughts and feelings regarding  situation/relationship with daughter, processed feelings and normalized as part of grieving process, assisted patient identify ways to improve sleep hygiene, assisted patient identify ways to increase social involvement, reviewed the importance of behavioral activation and assisted patient identify ways to increase involvement in activity  Current Status:   Depressed mood, tearfulness, isolative behaviors, excessive worry, loss of interest in activities, fatigue, ruminating thoughts  Suicidal/homicidal:   No.  Patient Progress:   Fair. Patient reports continued sadness and frustration regarding situation with her daughter. She also reports she is beginning to experience anger especially about not being able to see her grandchildren. Patient is experiencing sleep difficulty despite use of sleep a and reports ruminating thoughts especially at night. She reports she did walk a couple of days but also reports poor motivation. She is planning to join [employer]. She also attended a different church twice since last session and is hopeful about trying to become more involved in activities at the church.   Target Goals:   1. Increase behavioral activation to overcome symptoms of depression  Last Reviewed:     Goals Addressed Today:    1  Plan:   Return in 2 weeks  Impression/Diagnosis:   Patient reports the long-standing history of symptoms of depression and had a major depressive episode in 2016 resulting in hospitalization due to a suicide attempt. Patient has been managing symptoms fairly well but symptoms worsened about 2-1/2 weeks ago when she experienced suicidal ideations. This appears to have been triggered by lack of contact with her daughter since December 2017. She and daughter had been very close. Patient also has  not been allowed to see her 2 young grandsons since December 2017. Patient's current symptoms include fepressed mood, tearfulness, isolative behaviors, excessive worry, loss of  interest in activities, fatigue, and ruminating thoughts   Diagnosis:  Axis I: Major depressive disorder, recurrent, severe          Axis II: Deferred

## 2016-09-23 ENCOUNTER — Ambulatory Visit (INDEPENDENT_AMBULATORY_CARE_PROVIDER_SITE_OTHER): Payer: 59 | Admitting: Psychiatry

## 2016-09-23 ENCOUNTER — Encounter (HOSPITAL_COMMUNITY): Payer: Self-pay | Admitting: Psychiatry

## 2016-09-23 VITALS — BP 126/88 | HR 65 | Ht 64.0 in | Wt 246.0 lb

## 2016-09-23 DIAGNOSIS — F333 Major depressive disorder, recurrent, severe with psychotic symptoms: Secondary | ICD-10-CM

## 2016-09-23 DIAGNOSIS — Z6379 Other stressful life events affecting family and household: Secondary | ICD-10-CM | POA: Diagnosis not present

## 2016-09-23 DIAGNOSIS — Z811 Family history of alcohol abuse and dependence: Secondary | ICD-10-CM

## 2016-09-23 DIAGNOSIS — Z818 Family history of other mental and behavioral disorders: Secondary | ICD-10-CM | POA: Diagnosis not present

## 2016-09-23 MED ORDER — FLUOXETINE HCL 40 MG PO CAPS: 40.0000 mg | ORAL_CAPSULE | Freq: Every day | ORAL | 2 refills | Status: DC

## 2016-09-23 MED ORDER — BUPROPION HCL ER (XL) 300 MG PO TB24: 300.0000 mg | ORAL_TABLET | ORAL | 2 refills | Status: DC

## 2016-09-23 NOTE — Progress Notes (Signed)
Patient ID: Rebecca [name], female   DOB: 1955/06/25, 61 y.o.   MRN: [id number] Patient ID: Rebecca [name], female   DOB: 1955-07-05, 61 y.o.   MRN: [id number] Patient ID: Rebecca [name], female   DOB: 1955/07/07, 61 y.o.   MRN: [id number] Patient ID: Rebecca [name], female   DOB: December 17, 1955, 61 y.o.   MRN: [id number] Patient ID: Rebecca [name], female   DOB: [birth year], 61 y.o.   MRN: [id number] Patient ID: Rebecca [name], female   DOB: 24-Dec-1955, 61 y.o.   MRN: [id number] Patient ID: Rebecca [name], female   DOB: 1956-01-25, 61 y.o.   MRN: [id number] Patient ID: Rebecca [name], female   DOB: 1955-05-18, 61 y.o.   MRN: [id number] Patient ID: Rebecca [name], female   DOB: 12-02-1955, 61 y.o.   MRN: [id number] Patient ID: Rebecca [name], female   DOB: Aug 11, 1955, 61 y.o.   MRN: [id number]  Psychiatric Assessment Adult  Patient Identification:  Rebecca [name] Date of Evaluation:  09/23/2016 Chief Complaint: I'm doing better History of Chief Complaint:   Chief Complaint  Patient presents with  . Depression  . Anxiety  . Follow-up    Depression         Past medical history includes anxiety.   Anxiety  Symptoms include nervous/anxious behavior.     this patient is a 61 year old separated white female who lives alone in [place]. 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 She has one daughter and 2 grandchildren  The patient was referred by the [name] inpatient unit where she was hospitalized July 31 through August 6 after an intentional overdose on Ambien and oxycodone. The patient has had one prior overdose attempt in 2004. This occurred after she found that her first husband had had an affair with her sister years prior.  After the hospital patient 2004 the patient was followed for time by [name] in [place]. She did fairly well on Paxil CR. Most recently she has been treated [name] physicians [name] primary  [name] and Abilify was added. Over the last year however she has lost many important people in her life. 2 of her aunt her best friend another friend and her dog all died within a short period of time. She was going downhill and getting more and more depressed and was dealing with that by drinking 2-3 ounces of liquor per day. She got increasingly despondent and eventually decided to take her life and wrote a suicide note to her family. Reportedly she  was found the next morning and brought to the ER in the ICU and consequently to the behavioral health hospital.  While there are Wellbutrin and trazodone were added. The patient wasn't sleeping well prior to hospitalization but she sleeping much better now. Her mood is improved but her energy and motivation are still poor and she's developed tremor in both hands predicted on the left. 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 It's hard for her to type because of the tremor and she can't remember anything. She's no longer suicidal but just doesn't have much energy to do much of anything right now. She's denies auditory visual sensations or paranoia. She is no longer drinking  The patient returns today after  4 weeks. She has been seeing [name] here and it's really helping her  deal with her daughter's estrangement. She is trying to move on especially can. She spending more time with friends has found a new church and spends the weekends with her parents. Her daughter is still not communicating with her and it really hurts. Her energy is better on the Prozac and Velna Hatchet longer has suicidal ideation. She sleeping well with the Xanax and also melatonin. She continues to work 1 day a week. She denies any further suicidal ideation or plan Review of Systems  Constitutional: Positive for activity change.  HENT: Negative.   Respiratory: Negative.    Cardiovascular: Negative.   Gastrointestinal: Negative.   Endocrine: Negative.   Genitourinary: Negative.   Musculoskeletal: Positive for arthralgias and joint swelling.  Allergic/Immunologic: Negative.   Neurological: Positive for tremors.  Hematological: Negative.   Psychiatric/Behavioral: Positive for depression and dysphoric mood. The patient is nervous/anxious.    Physical Examnot done  Depressive Symptoms: depressed mood, anhedonia, insomnia, difficulty concentrating, anxiety, loss of energy/fatigue,  (Hypo) Manic Symptoms:   Elevated Mood:  No Irritable Mood:  No Grandiosity:  No Distractibility:  Yes Labiality of Mood:  Yes Delusions:  No Hallucinations:  No Impulsivity:  No Sexually Inappropriate Behavior:  No Financial Extravagance:  No Flight of Ideas:  No  Anxiety Symptoms: Excessive Worry:  Yes Panic Symptoms:  Yes Agoraphobia:  No Obsessive Compulsive: No  Symptoms: None, Specific Phobias:  No Social Anxiety:  Yes  Psychotic Symptoms:  Hallucinations: No None Delusions:  No Paranoia:  No   Ideas of Reference:  No  PTSD Symptoms: Ever had a traumatic exposure:  No Had a traumatic exposure in the last month:  No Re-experiencing: No None Hypervigilance:  No Hyperarousal: No None Avoidance: No None  Traumatic Brain Injury: No   Past Psychiatric History: Diagnosis: Maj. depression   Hospitalizations: In 2004 and again last month   Out[facility] [name]: At [facility]   Substance Abuse [name]: none  Self-Mutilation: none  Suicidal Attempts: Overdose attempts twice   Violent Behaviors:none   Past Medical History:   Past Medical History:  Diagnosis Date  . Asthma   . Chronic pain began in 2011 after jooint replacements  . Depression   . Reflux    History of Loss of Consciousness:  Yes Seizure History:  No Cardiac History:  No Allergies:   Allergies  Allergen Reactions  . Sulfa Antibiotics Other (See Comments)     unknown   Current Medications:  Current Outpatient Prescriptions  Medication Sig Dispense Refill  . albuterol (PROVENTIL HFA;VENTOLIN HFA) 108 (90 BASE) MCG/ACT inhaler Inhale 2 puffs into the lungs every 4 (four) hours as needed for wheezing or shortness of breath.    Marland Kitchen albuterol (PROVENTIL) (2.5 MG/3ML) 0.083% nebulizer solution Take 2.5 mg by nebulization every 4 (four) hours as needed for wheezing or shortness of breath.    Marland Kitchen buPROPion (WELLBUTRIN XL) 300 MG 24 hr tablet Take 1 tablet (300 mg total) by mouth every morning. 90 tablet 2  . clonazePAM (KLONOPIN) 1 MG tablet Take 1 tablet (1 mg total) by mouth at bedtime. 30 tablet 2  . FLUoxetine (PROZAC) 40 MG capsule Take 1 capsule (40 mg total) by mouth daily. 90 capsule 2  . Fluticasone-Salmeterol (ADVAIR) 250-50 MCG/DOSE AEPB Inhale 1 puff into the lungs 2 (two) times daily.    Marland Kitchen gabapentin (NEURONTIN) 300 MG capsule Take 1 capsule (300 mg total) by mouth 2 (two) times daily. For agitation/pain managment 180 capsule 2  . lidocaine (LIDODERM) 5 %  Place 1 patch onto the skin daily as needed (for pain). Remove & Discard patch within 12 hours or as directed by MD    . pantoprazole (PROTONIX) 40 MG tablet Take 1 tablet (40 mg total) by mouth daily. For acid reflux    . rOPINIRole (REQUIP) 0.5 MG tablet Take 1 tablet (0.5 mg total) by mouth at bedtime. For restless leg syndrome    . traMADol (ULTRAM) 50 MG tablet Take 50 mg by mouth 2 (two) times daily.     No current facility-administered medications for this visit.     Previous Psychotropic Medications:  Medication Dose   Paxil CR, Ambien                        Substance Abuse History in the last 12 months: Substance Age of 1st Use Last Use Amount Specific Type  Nicotine      Alcohol    was drinking 2-3 ounces of liquor per day until about 3 weeks ago    Cannabis      Opiates      Cocaine      Methamphetamines      LSD      Ecstasy      Benzodiazepines      Caffeine       Inhalants      Others:                          Medical Consequences of Substance Abuse: none  Legal Consequences of Substance Abuse: none  Family Consequences of Substance Abuse: none  Blackouts:  No DT's:  No Withdrawal Symptoms:  No None  Social History: Current Place of Residence: [place] [address] of Birth: [place] Family Members: Husband daughter 2 grandchildren Marital Status:  Married Children:   Sons:   Daughters: 1 Relationships: Education:  Corporate treasurer Problems/Performance:  Religious Beliefs/Practices: Christian History of Abuse: none Armed forces technical officer; 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 Legal History: none Hobbies/Interests: Animals gardening painting  Family History:   Family History  Problem Relation Age of Onset  . Bipolar disorder Sister   . Alcohol abuse Maternal Grandfather   . Deep vein thrombosis Neg Hx   . Pulmonary embolism Neg Hx   . Heart disease Neg Hx   . Heart failure Neg Hx     Mental Status Examination/Evaluation: Objective:  Appearance: Casual and Well Groomed    Eye Contact::  Good  Speech:  Clear and Coherent  Volume:  Decreased  Mood: Fairly good   Affect: Chief Executive Officer Process:  Coherent  Orientation:  Full (Time, Place, and Person)  Thought Content:  Rumination  Suicidal Thoughts:  No  Homicidal Thoughts:  No  Judgement:  Good  Insight:  Good  Psychomotor Activity: Normal   Akathisia:  No  Handed:  Right  AIMS (if indicated):    Assets:  Communication Skills Desire for Improvement Resilience Social Support Talents/Skills    Laboratory/X-Ray Psychological Evaluation(s)   Reviewed in chart      Assessment:  Axis I: Major Depression, Recurrent severe  AXIS I Major Depression, Recurrent severe  AXIS II Deferred  AXIS III Past Medical History:  Diagnosis Date  . Asthma   . Chronic pain began in 2011 after jooint replacements  . Depression   .  Reflux      AXIS IV other psychosocial or environmental problems  AXIS  V 51-60 moderate symptoms   Treatment Plan/Recommendations:  Plan of [name]: Medication management   Laboratory:    Psychotherapy: She is seeing [name] here   Medications: She'll continue Wellbutrin and  for depression and gabapentin for anxiety  She'll continue clonazepam to 1 mg daily for breakthrough anxiety.She will continue Prozac 40 mg daily   Routine PRN Medications:  No  Consultations:   Safety Concerns:  She denies current thoughts of self-harm   Other: She'll return in 6 weeks     Diannia Ruder, MD 8/21/201810:46 AM     Patient ID: Rebecca [name], female   DOB: Jan 23, 1956, 61 y.o.   MRN: [id number]

## 2016-09-29 MED FILL — GABAPENTIN 300 MG CAPSULE: 300 | 90 days supply | Qty: 180 | Fill #1

## 2016-09-29 MED FILL — ADVAIR 250/50 DISKUS: 250-50 | 30 days supply | Qty: 60 | Fill #0

## 2016-10-09 MED FILL — rOPINIRole HCL 0.5 MG TABS: 0.5 | 90 days supply | Qty: 90 | Fill #0

## 2016-10-14 ENCOUNTER — Encounter (HOSPITAL_COMMUNITY): Payer: Self-pay | Admitting: Psychiatry

## 2016-10-14 ENCOUNTER — Ambulatory Visit (INDEPENDENT_AMBULATORY_CARE_PROVIDER_SITE_OTHER): Payer: 59 | Admitting: Psychiatry

## 2016-10-14 DIAGNOSIS — F333 Major depressive disorder, recurrent, severe with psychotic symptoms: Secondary | ICD-10-CM

## 2016-10-14 NOTE — Progress Notes (Signed)
Patient:  Rebecca [name]   DOB: [birth year]  MR Number: [id number]    Location: Behavioral Health Center:  [address] [place],  [place], [id number]  Start: Tuesday 10/14/2016 11:10 AM End: Tuesday 10/14/2016 11:56 AM   Provider/Observer:     Florencia ReasonsPeggy Athenia Rys, MSW, LCSW   Chief Complaint:      No chief complaint on file.   Reason For Service:     Rebecca [name] is a 61 year old female who has been seen in this practice since August 2015. [facility] she was treated from 09/04/2013 through 09/08/2013 for depression and a suicide attempt. She last was seen by this clinician in July 2016 but has continued to see psychiatrist Dr. Tenny Crawoss for medication management. She is resuming services today per Dr. Tenny Crawoss is recommendation due to increased symptoms of depression. She reports having suicidal thoughts about 2-1/2 weeks ago due to estrangement from the relationship with her only child, her 60 year old daughter. Per patient's report, she and daughter had conflict in December 2017 and patient has not seen her or her grandchildren since that time. She sentt daughter a text message when she was feeling suicidal. Her daughter did not respond to patient but contact the local Sheriff's office. She also contacted patient's parents who have tried to talk to patient's daughter on her behalf. However, daughter still refuses to have contact.  Patient reports daughter's husband is very controlling and suspects he is influencing her daughter's behavior.  Interventions Strategy:  Supportive  Participation Level:   Active  Participation Quality:  Appropriate      Behavioral Observation:  Casual, Alert, and Appropriate, Depressed and Tearful.   Current Psychosocial Factors: Lack of contact with daughter and grandsons since 01/2016  Content of Session:   Reviewed symptoms, facilitated praised and reinforced patient's increased social contact and involvement in  activity, discussed effects on patient's thoughts/mood/behavior, praised and reinforced patient's efforts to increase sleep hygiene, discussed patient's strengths and supports, developed treatment plan, assisted patient identify ways to maintain consistent healthy social contact and involvement in activity, assigned patient to bring in pictures of daughter for next session   Current Status:   Depressed mood, tearfulness, isolative behaviors, excessive worry, loss of interest in activities, fatigue, ruminating thoughts  Suicidal/homicidal:   No.  Patient Progress:   Good. Patient reports feeling better since last session. She has increased social contact and involvement in activity. She has been attending church on Sundays and Wednesday nights. She's begun to develop relationships through an adult Bible study. This has been very helpful. She also reconnected with some of her friends in [place] and recently attended a party. She continues to work one day per week. She has not joined a [employer] sne[employer] program yet but has begun a daily walking regimen which also has been helpful. Patient also reports improved sleep pattern. She continues to miss her daughter and grandsons but reports less ruminating over them at night. She has been using her spirituality using prayer and positive thoughts to cope with this.   Target Goals:   1. Verbalize feelings associated with the estranged relationship with daughter and grandchildren.    2. Verbalize an understanding and resolution of current interpersonal problems.    3. Learning implement relapse prevention skills.  Last Reviewed:   10/14/2016  Goals Addressed Today:    1,2,3  Plan:   Return in 2 weeks  Impression/Diagnosis:   Patient reports the long-standing history of symptoms of depression  and had a major depressive episode in 2016 resulting in hospitalization due to a suicide attempt. Patient has been managing symptoms fairly well but symptoms worsened  about 2-1/2 weeks ago when she experienced suicidal ideations. This appears to have been triggered by lack of contact with her daughter since December 2017. She and daughter had been very close. Patient also has not been allowed to see her 2 young grandsons since December 2017. Patient's current symptoms include fepressed mood, tearfulness, isolative behaviors, excessive worry, loss of interest in activities, fatigue, and ruminating thoughts   Diagnosis:  Axis I: Major depressive disorder, recurrent, severe          Axis II: Deferred

## 2016-10-20 MED FILL — clonazePAM 1 MG TABS: 1 | 30 days supply | Qty: 30 | Fill #1

## 2016-10-20 MED FILL — PANTOPRAZOLE SOD DR 40 MG T: 40 | 90 days supply | Qty: 90 | Fill #0

## 2016-11-03 MED FILL — ADVAIR 250/50 DISKUS: 250-50 | 30 days supply | Qty: 60 | Fill #1

## 2016-11-04 ENCOUNTER — Ambulatory Visit (HOSPITAL_COMMUNITY): Payer: 59 | Admitting: Psychiatry

## 2016-11-05 ENCOUNTER — Ambulatory Visit (INDEPENDENT_AMBULATORY_CARE_PROVIDER_SITE_OTHER): Payer: 59 | Admitting: Psychiatry

## 2016-11-05 ENCOUNTER — Encounter (HOSPITAL_COMMUNITY): Payer: Self-pay | Admitting: Psychiatry

## 2016-11-05 DIAGNOSIS — F333 Major depressive disorder, recurrent, severe with psychotic symptoms: Secondary | ICD-10-CM

## 2016-11-05 NOTE — Progress Notes (Signed)
4 Patient:  Rebecca [name]   DOB: [birth year]  MR Number: [id number]    Location: [facility]:  [address] [place] [place],  [place], [address]  Start: Wednesday 11/05/2016 10:03 AM End: Wednesday 11/05/2016 10.57 AM  Provider/Observer:     Florencia Reasons, MSW, LCSW   Chief Complaint:      Chief Complaint  Patient presents with  . Depression    Reason For Service:     Rebecca [name] is a 61 year old female who has been seen in this practice since August 2015. She initially was referred from the [facility] where she was treated from 09/04/2013 through 09/08/2013 for depression and a suicide attempt. She last was seen by this clinician in July 2016 but has continued to see psychiatrist Dr. Tenny Craw for medication management. She is resuming services today per Dr. Tenny Craw is recommendation due to increased symptoms of depression. She reports having suicidal thoughts about 2-1/2 weeks ago due to estrangement from the relationship with her only child, her 54 year old daughter. Per patient's report, she and daughter had conflict in December 2017 and patient has not seen her or her grandchildren since that time. She sentt daughter a text message when she was feeling suicidal. Her daughter did not respond to patient but contact the local Sheriff's office. She also contacted patient's parents who have tried to talk to patient's daughter on her behalf. However, daughter still refuses to have contact.  Patient reports daughter's husband is very controlling and suspects he is influencing her daughter's behavior.  Interventions Strategy:  Supportive  Participation Level:   Active  Participation Quality:  Appropriate      Behavioral Observation:  Casual, Alert, and Appropriate, Depressed and Tearful.   Current Psychosocial Factors: Lack of contact with daughter and grandsons since 01/2016  Content of Session:   Reviewed symptoms, facilitated praised and reinforced patient's increased social  contact and involvement in activity, Assisted patient identify and address thoughts and processes that inhibit positive sleep hygiene, reviewed and discussed pictures of patient's daughter to facilitate patient balance her pain with pleasurable memories of her daughter, used non-directive approach for patient to also be able to verbalize feelings of anger, assigned patient to bring in pictures of grandsons for next session   Current Status:   Less depressed mood, decreased tearfulness, increased social involvement, increased interest in activities, continued worry and ruminating thoughts   Suicidal/homicidal:   No.  Patient Progress:   Good. Patient reports continued social involvement and participation in activities since last session. She has increased attendance at church. She also reports going out  with friends more frequently. She is beginning to resume normal interest in other activities such as shopping and decorating. She continues to have sleep difficulty and reports avoiding going to bed at an established time due to painful emotions and ruminating thoughts. She continues to miss her daughter and grandsons.  Target Goals:   1. Verbalize feelings associated with the estranged relationship with daughter and grandchildren.    2. Verbalize an understanding and resolution of current interpersonal problems.    3. Learning implement relapse prevention skills.  Last Reviewed:   10/14/2016  Goals Addressed Today:    1,2,3  Plan:   Return in 2 weeks  Impression/Diagnosis:   Patient reports the long-standing history of symptoms of depression and had a major depressive episode in 2016 resulting in hospitalization due to a suicide attempt. Patient has been managing symptoms fairly well but symptoms worsened about 2-1/2 weeks ago when  she experienced suicidal ideations. This appears to have been triggered by lack of contact with her daughter since December 2017. She and daughter had been very close.  Patient also has not been allowed to see her 2 young grandsons since December 2017. Patient's current symptoms include fepressed mood, tearfulness, isolative behaviors, excessive worry, loss of interest in activities, fatigue, and ruminating thoughts   Diagnosis:  Axis I: Major depressive disorder, recurrent, severe          Axis II: Deferred

## 2016-11-10 ENCOUNTER — Ambulatory Visit (HOSPITAL_COMMUNITY): Payer: 59 | Admitting: Psychiatry

## 2016-11-18 ENCOUNTER — Ambulatory Visit (HOSPITAL_COMMUNITY): Payer: Self-pay | Admitting: Psychiatry

## 2016-11-24 ENCOUNTER — Telehealth (HOSPITAL_COMMUNITY): Payer: Self-pay | Admitting: *Deleted

## 2016-11-24 ENCOUNTER — Ambulatory Visit (HOSPITAL_COMMUNITY): Payer: Self-pay | Admitting: Psychiatry

## 2016-11-24 NOTE — Telephone Encounter (Signed)
returned phone call to patient regarding an appointment, no answer, left voice message. 

## 2016-11-26 MED FILL — FLUoxetine HCL 40 MG CAPS: 40 | 90 days supply | Qty: 90 | Fill #1

## 2016-12-01 ENCOUNTER — Telehealth (HOSPITAL_COMMUNITY): Payer: Self-pay | Admitting: *Deleted

## 2016-12-01 NOTE — Telephone Encounter (Signed)
left voice message, provider out of office at time of appointment.

## 2016-12-02 ENCOUNTER — Ambulatory Visit (HOSPITAL_COMMUNITY): Payer: Self-pay | Admitting: Psychiatry

## 2016-12-03 MED FILL — traMADol HCL 50 MG TABS: 50 | 90 days supply | Qty: 180 | Fill #1

## 2016-12-09 MED FILL — ADVAIR 250/50 DISKUS: 250-50 | 30 days supply | Qty: 60 | Fill #2

## 2016-12-10 MED FILL — BUPROPION HCL XL 300 MG TAB: 300 | 90 days supply | Qty: 90 | Fill #1

## 2016-12-12 ENCOUNTER — Encounter (HOSPITAL_COMMUNITY): Payer: Self-pay | Admitting: Psychiatry

## 2016-12-12 ENCOUNTER — Ambulatory Visit (HOSPITAL_COMMUNITY): Payer: 59 | Admitting: Psychiatry

## 2016-12-12 VITALS — BP 135/93 | HR 75 | Ht 64.0 in | Wt 233.0 lb

## 2016-12-12 DIAGNOSIS — Z79899 Other long term (current) drug therapy: Secondary | ICD-10-CM

## 2016-12-12 DIAGNOSIS — F333 Major depressive disorder, recurrent, severe with psychotic symptoms: Secondary | ICD-10-CM

## 2016-12-12 MED ORDER — FLUOXETINE HCL 40 MG PO CAPS: 40.0000 mg | ORAL_CAPSULE | Freq: Every day | ORAL | 2 refills | Status: DC

## 2016-12-12 MED ORDER — BUPROPION HCL ER (XL) 300 MG PO TB24: 300.0000 mg | ORAL_TABLET | ORAL | 2 refills | Status: DC

## 2016-12-12 MED ORDER — GABAPENTIN 300 MG PO CAPS: 300.0000 mg | ORAL_CAPSULE | Freq: Two times a day (BID) | ORAL | 2 refills | Status: DC

## 2016-12-12 MED ORDER — CLONAZEPAM 1 MG PO TABS: 1.0000 mg | ORAL_TABLET | Freq: Every day | ORAL | 2 refills | Status: DC

## 2016-12-12 NOTE — Progress Notes (Signed)
BH MD/PA/NP OP Progress Note  12/12/2016 10:45 AM Rebecca [name]  MRN:  [id number]  Chief Complaint:  Chief Complaint    Depression; Anxiety; Follow-up     HPI:   this patient is a 61 year old separated white female who lives alone in [place]. 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 She has one daughter and 2 grandchildren  The patient was referred by the [facility] where she was hospitalized July 31 through August 6 after an intentional overdose on Ambien and oxycodone. The patient has had one prior overdose attempt in 2004. This occurred after she found that her first husband had had an affair with her sister years prior.  After the hospital patient  the patient was followed for time by [facility] in [place]. She did fairly well on Paxil CR. Most recently she has been treated [facility] primary care and Abilify was added. Over the last year however she has lost many important people in her life. 2 of her aunt her best friend another friend and her dog all died within a short period of time. She was going downhill and getting more and more depressed and was dealing with that by drinking 2-3 ounces of liquor per day. She got increasingly despondent and eventually decided to take her life and wrote a suicide note to her family. Reportedly she  was found the next morning and brought to the ER in the ICU and consequently to the behavioral health hospital.  While there are Wellbutrin and trazodone were added. The patient wasn't sleeping well prior to hospitalization but she sleeping much better now. Her mood is improved but her energy and motivation are still poor and she's developed tremor in both hands predicted on the left. 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 It's hard for her to type  because of the tremor and she can't remember anything. She's no longer suicidal but just doesn't have much energy to do much of anything right now. She's denies   She returns after 3 months.  She states that she is doing much better her daughter has not been speaking to her but she is finally come to terms with that and knows that she is done nothing wrong.  She thinks her daughter's husband is being very controlling and not letting the daughter have contact with family or friends.  She has joined a new church made a lot of new friends and is going on a lot of outings and activities.  She is trying to live her long life.  She is sleeping well exercising more and lost weight and her energy is good.  She denies any suicidal thinking Visit Diagnosis:    ICD-10-CM   1. 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 Asthma   . Chronic pain began in 2011 after jooint replacements  . Depression   . Reflux     Past Surgical History:  Procedure Laterality Date  . ABDOMINAL HYSTERECTOMY    . FOOT SURGERY  2011 -both feet  . KNEE SURGERY     left knee  . SHOULDER SURGERY  2011    Family Psychiatric History: See  below  Family History:  Family History  Problem Relation Age of Onset  . Bipolar disorder Sister   . Alcohol abuse Maternal Grandfather   . Deep vein thrombosis Neg Hx   . Pulmonary embolism Neg Hx   . Heart disease Neg Hx   . Heart failure Neg Hx     Social History:  Social History   Socioeconomic History  . Marital status: Legally Separated    Spouse name: None  . Number of children: None  . Years of education: None  . Highest education level: None  Social Needs  . Financial resource strain: None  . Food insecurity - worry: None  . Food insecurity - inability: None  . Transportation needs  - medical: None  . Transportation needs - non-medical: None  Occupational History  . None  Tobacco Use  . Smoking status: Former Smoker    Types: Cigarettes    Last attempt to quit: 09/19/2009    Years since quitting: 7.2  . Smokeless tobacco: Never Used  Substance and Sexual Activity  . Alcohol use: No    Alcohol/week: 1.2 oz    Types: 2 Shots of liquor per week  . Drug use: No  . Sexual activity: Yes  Other Topics Concern  . None  Social History Narrative  . None    Allergies:  Allergies  Allergen Reactions  . Sulfa Antibiotics Other (See Comments)    unknown    Metabolic Disorder Labs: No results found for: HGBA1C, MPG No results found for: PROLACTIN No results found for: CHOL, TRIG, HDL, CHOLHDL, VLDL, LDLCALC Lab Results  Component Value Date   TSH 2.310 09/06/2013    Therapeutic Level Labs: No results found for: LITHIUM No results found for: VALPROATE No components found for:  CBMZ  Current Medications: Current Outpatient Medications  Medication Sig Dispense Refill  . albuterol (PROVENTIL HFA;VENTOLIN HFA) 108 (90 BASE) MCG/ACT inhaler Inhale 2 puffs into the lungs every 4 (four) hours as needed for wheezing or shortness of breath.    Marland Kitchen. albuterol (PROVENTIL) (2.5 MG/3ML) 0.083% nebulizer solution Take 2.5 mg by nebulization every 4 (four) hours as needed for wheezing or shortness of breath.    Marland Kitchen. buPROPion (WELLBUTRIN XL) 300 MG 24 hr tablet Take 1 tablet (300 mg total) every morning by mouth. 90 tablet 2  . clonazePAM (KLONOPIN) 1 MG tablet Take 1 tablet (1 mg total) at bedtime by mouth. 30 tablet 2  . FLUoxetine (PROZAC) 40 MG capsule Take 1 capsule (40 mg total) daily by mouth. 90 capsule 2  . Fluticasone-Salmeterol (ADVAIR) 250-50 MCG/DOSE AEPB Inhale 1 puff into the lungs 2 (two) times daily.    Marland Kitchen. gabapentin (NEURONTIN) 300 MG capsule Take 1 capsule (300 mg total) 2 (two) times daily by mouth. For agitation/pain managment 180 capsule 2  . pantoprazole  (PROTONIX) 40 MG tablet Take 1 tablet (40 mg total) by mouth daily. For acid reflux    . rOPINIRole (REQUIP) 0.5 MG tablet Take 1 tablet (0.5 mg total) by mouth at bedtime. For restless leg syndrome    . traMADol (ULTRAM) 50 MG tablet Take 50 mg by mouth 2 (two) times daily.    Marland Kitchen. lidocaine (LIDODERM) 5 % Place 1 patch onto the skin daily as needed (for pain). Remove & Discard patch within 12 hours or as directed by MD     No current facility-administered medications for this visit.      Musculoskeletal: Strength & Muscle Tone: within normal limits Gait &  Station: normal Patient leans: N/A  Psychiatric Specialty Exam: Review of Systems  All other systems reviewed and are negative.   Blood pressure (!) 135/93, pulse 75, height 5\' 4"  (1.626 m), weight 233 lb (105.7 kg).Body mass index is 39.99 kg/m.  General Appearance: Casual, Neat and Well Groomed  Eye Contact:  Good  Speech:  Clear and Coherent  Volume:  Normal  Mood:  Euthymic  Affect:  Appropriate  Thought Process:  Goal Directed  Orientation:  Full (Time, Place, and Person)  Thought Content: WDL   Suicidal Thoughts:  No  Homicidal Thoughts:  No  Memory:  Immediate;   Good Recent;   Good Remote;   Good  Judgement:  Good  Insight:  Good  Psychomotor Activity:  Normal  Concentration:  Concentration: Good and Attention Span: Good  Recall:  Good  Fund of Knowledge: Good  Language: Good  Akathisia:  No  Handed:  Right  AIMS (if indicated): not done  Assets:  Communication Skills Desire for Improvement Physical Health Resilience Social Support Talents/Skills Vocational/Educational  ADL's:  Intact  Cognition: WNL  Sleep:  Good   Screenings: AUDIT     [facility]  Alcohol Use Disorder Identification Test Final Score (AUDIT)  40       Assessment and Plan: Patient is a 61 year old female with a history of depression and anxiety.  She has  been struggling with estrangement for her daughter but she is doing much better.  She will continue Prozac 40 mg daily for depression as well as Wellbutrin XL 300 mg daily for depression, gabapentin 300 mg twice daily for anxiety and clonazepam 1 mg at bedtime for anxiety and/or sleep.  She will return to see me in 3 months   Diannia RuderOSS, DEBORAH, MD 12/12/2016, 10:45 AM

## 2016-12-31 ENCOUNTER — Ambulatory Visit (HOSPITAL_COMMUNITY): Payer: Self-pay | Admitting: Psychiatry

## 2017-01-01 MED FILL — clonazePAM 1 MG TABS: 1 | 30 days supply | Qty: 30 | Fill #0

## 2017-01-09 MED FILL — ADVAIR 250/50 DISKUS: 250-50 | 30 days supply | Qty: 60 | Fill #3

## 2017-01-12 ENCOUNTER — Ambulatory Visit (HOSPITAL_COMMUNITY): Payer: Self-pay | Admitting: Psychiatry

## 2017-01-13 MED FILL — rOPINIRole HCL 0.5 MG TABS: 0.5 | 90 days supply | Qty: 90 | Fill #1

## 2017-01-29 MED FILL — PANTOPRAZOLE SOD DR 40 MG T: 40 | 90 days supply | Qty: 90 | Fill #1

## 2017-01-29 MED FILL — GABAPENTIN 300 MG CAPSULE: 300 | 90 days supply | Qty: 180 | Fill #2

## 2017-02-02 ENCOUNTER — Other Ambulatory Visit: Payer: Self-pay | Admitting: Family Medicine

## 2017-02-02 DIAGNOSIS — M8580 Other specified disorders of bone density and structure, unspecified site: Secondary | ICD-10-CM

## 2017-02-09 MED FILL — ADVAIR 250/50 DISKUS: 250-50 | 30 days supply | Qty: 60 | Fill #4

## 2017-02-25 ENCOUNTER — Ambulatory Visit
Admission: RE | Admit: 2017-02-25 | Discharge: 2017-02-25 | Disposition: A | Discharge status: Home / Self Care | Payer: Medicare Other | Source: Ambulatory Visit | Attending: Family Medicine | Admitting: Family Medicine

## 2017-02-25 DIAGNOSIS — M8580 Other specified disorders of bone density and structure, unspecified site: Secondary | ICD-10-CM

## 2017-03-04 MED FILL — BUPROPION HCL XL 300 MG TAB: 300 | 90 days supply | Qty: 90 | Fill #2

## 2017-03-04 MED FILL — clonazePAM 1 MG TABS: 1 | 30 days supply | Qty: 30 | Fill #1

## 2017-03-04 MED FILL — FLUoxetine HCL 40 MG CAPS: 40 | 90 days supply | Qty: 90 | Fill #2

## 2017-03-13 ENCOUNTER — Ambulatory Visit (HOSPITAL_COMMUNITY): Payer: 59 | Admitting: Psychiatry

## 2017-03-13 ENCOUNTER — Encounter (HOSPITAL_COMMUNITY): Payer: Self-pay | Admitting: Psychiatry

## 2017-03-13 VITALS — BP 114/74 | HR 89 | Ht 64.0 in | Wt 235.0 lb

## 2017-03-13 DIAGNOSIS — Z87891 Personal history of nicotine dependence: Secondary | ICD-10-CM

## 2017-03-13 DIAGNOSIS — M549 Dorsalgia, unspecified: Secondary | ICD-10-CM | POA: Diagnosis not present

## 2017-03-13 DIAGNOSIS — F419 Anxiety disorder, unspecified: Secondary | ICD-10-CM | POA: Diagnosis not present

## 2017-03-13 DIAGNOSIS — Z811 Family history of alcohol abuse and dependence: Secondary | ICD-10-CM

## 2017-03-13 DIAGNOSIS — R251 Tremor, unspecified: Secondary | ICD-10-CM

## 2017-03-13 DIAGNOSIS — F1099 Alcohol use, unspecified with unspecified alcohol-induced disorder: Secondary | ICD-10-CM

## 2017-03-13 DIAGNOSIS — F333 Major depressive disorder, recurrent, severe with psychotic symptoms: Secondary | ICD-10-CM

## 2017-03-13 DIAGNOSIS — Z818 Family history of other mental and behavioral disorders: Secondary | ICD-10-CM

## 2017-03-13 DIAGNOSIS — Z915 Personal history of self-harm: Secondary | ICD-10-CM

## 2017-03-13 MED ORDER — CLONAZEPAM 1 MG PO TABS: 1.0000 mg | ORAL_TABLET | Freq: Every day | ORAL | 3 refills | Status: DC

## 2017-03-13 MED ORDER — FLUOXETINE HCL 40 MG PO CAPS: 40.0000 mg | ORAL_CAPSULE | Freq: Every day | ORAL | 2 refills | Status: DC

## 2017-03-13 MED ORDER — GABAPENTIN 300 MG PO CAPS: 300.0000 mg | ORAL_CAPSULE | Freq: Two times a day (BID) | ORAL | 2 refills | Status: DC

## 2017-03-13 MED ORDER — BUPROPION HCL ER (XL) 300 MG PO TB24: 300.0000 mg | ORAL_TABLET | ORAL | 2 refills | Status: DC

## 2017-03-13 NOTE — Progress Notes (Signed)
BH MD/PA/NP OP Progress Note  03/13/2017 10:30 AM Rebecca [name]  MRN:  [id number]  Chief Complaint:  Chief Complaint    Depression; Anxiety; Follow-up     HPI: this patient is a 62  -year-old separated white female who lives alone in [place]. 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 She has one daughter and 2 grandchildren  The patient was [facility] where she was hospitalized July 31 through August 6 after an intentional overdose on Ambien and oxycodone. The patient has had one prior overdose attempt in 2004. This occurred after she found that her first husband had had an affair with her sister years prior.  After the hospital patient  the patient was followed for time by [facility] [place]. She did fairly well on Paxil CR. Most recently she has been treated [facility] primary care and Abilify was added. Over the last year however she has lost many important people in her life. 2 of her aunt her best friend another friend and her dog all died within a short period of time. She was going downhill and getting more and more depressed and was dealing with that by drinking 2-3 ounces of liquor per day. She got increasingly despondent and eventually decided to take her life and wrote a suicide note to her family. Reportedly she was found the next morning and brought to the ER in the ICU and consequently to the behavioral health hospital.  While there are Wellbutrin and trazodone were added. The patient wasn't sleeping well prior to hospitalization but she sleeping much better now. Her mood is improved but her energy and motivation are still poor and she's developed tremor in both hands predicted on the left. 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 It's hard for her to  type because of the tremor and she can't remember anything. She's no longer suicidal but just doesn't have much energy to do much of anything right now.   The patient returns after 3 months.  She states that her daughter is still not speaking to her and neither does anyone else in the daughter's family.  She is come to terms with this and is excepting and even though she is not happy about it.  She is going to her own church, doing things with friends and family members traveling some working 1 day a week.  Her mood is good her energy is improved and she is sleeping well at night.  She denies any thoughts of self-harm or suicide. Visit Diagnosis:    ICD-10-CM   1. 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 Asthma   . Chronic pain began in 2011 after jooint replacements  . Depression   . Reflux     Past Surgical History:  Procedure Laterality Date  . ABDOMINAL HYSTERECTOMY    . FOOT SURGERY  2011 -both feet  . KNEE SURGERY     left knee  . SHOULDER SURGERY  2011    Family Psychiatric History: See below  Family History:  Family History  Problem Relation Age of Onset  . Bipolar disorder Sister   . Alcohol  abuse Maternal Grandfather   . Deep vein thrombosis Neg Hx   . Pulmonary embolism Neg Hx   . Heart disease Neg Hx   . Heart failure Neg Hx     Social History:  Social History   Socioeconomic History  . Marital status: Legally Separated    Spouse name: None  . Number of children: None  . Years of education: None  . Highest education level: None  Social Needs  . Financial resource strain: None  . Food insecurity - worry: None  . Food insecurity - inability: None  . Transportation needs - medical: None  . Transportation needs - non-medical: None  Occupational History  . None  Tobacco  Use  . Smoking status: Former Smoker    Types: Cigarettes    Last attempt to quit: 09/19/2009    Years since quitting: 7.4  . Smokeless tobacco: Never Used  Substance and Sexual Activity  . Alcohol use: No    Alcohol/week: 1.2 oz    Types: 2 Shots of liquor per week  . Drug use: No  . Sexual activity: Yes  Other Topics Concern  . None  Social History Narrative  . None    Allergies:  Allergies  Allergen Reactions  . Sulfa Antibiotics Other (See Comments)    unknown    Metabolic Disorder Labs: No results found for: HGBA1C, MPG No results found for: PROLACTIN No results found for: CHOL, TRIG, HDL, CHOLHDL, VLDL, LDLCALC Lab Results  Component Value Date   TSH 2.310 09/06/2013    Therapeutic Level Labs: No results found for: LITHIUM No results found for: VALPROATE No components found for:  CBMZ  Current Medications: Current Outpatient Medications  Medication Sig Dispense Refill  . albuterol (PROVENTIL HFA;VENTOLIN HFA) 108 (90 BASE) MCG/ACT inhaler Inhale 2 puffs into the lungs every 4 (four) hours as needed for wheezing or shortness of breath.    Marland Kitchen albuterol (PROVENTIL) (2.5 MG/3ML) 0.083% nebulizer solution Take 2.5 mg by nebulization every 4 (four) hours as needed for wheezing or shortness of breath.    Marland Kitchen buPROPion (WELLBUTRIN XL) 300 MG 24 hr tablet Take 1 tablet (300 mg total) by mouth every morning. 90 tablet 2  . clonazePAM (KLONOPIN) 1 MG tablet Take 1 tablet (1 mg total) by mouth at bedtime. 30 tablet 3  . FLUoxetine (PROZAC) 40 MG capsule Take 1 capsule (40 mg total) by mouth daily. 90 capsule 2  . Fluticasone-Salmeterol (ADVAIR) 250-50 MCG/DOSE AEPB Inhale 1 puff into the lungs 2 (two) times daily.    Marland Kitchen gabapentin (NEURONTIN) 300 MG capsule Take 1 capsule (300 mg total) by mouth 2 (two) times daily. For agitation/pain managment 180 capsule 2  . lidocaine (LIDODERM) 5 % Place 1 patch onto the skin daily as needed (for pain). Remove & Discard patch within 12 hours  or as directed by MD    . pantoprazole (PROTONIX) 40 MG tablet Take 1 tablet (40 mg total) by mouth daily. For acid reflux    . rOPINIRole (REQUIP) 0.5 MG tablet Take 1 tablet (0.5 mg total) by mouth at bedtime. For restless leg syndrome    . traMADol (ULTRAM) 50 MG tablet Take 50 mg by mouth 2 (two) times daily.     No current facility-administered medications for this visit.      Musculoskeletal: Strength & Muscle Tone: within normal limits Gait & Station: normal Patient leans: N/A  Psychiatric Specialty Exam: Review of Systems  Musculoskeletal: Positive for back pain.  All other systems  reviewed and are negative.   Blood pressure 114/74, pulse 89, height 5\' 4"  (1.626 m), weight 235 lb (106.6 kg), SpO2 95 %.Body mass index is 40.34 kg/m.  General Appearance: Casual and Fairly Groomed  Eye Contact:  Good  Speech:  Clear and Coherent  Volume:  Normal  Mood:  Euthymic  Affect:  Congruent  Thought Process:  Goal Directed  Orientation:  Full (Time, Place, and Person)  Thought Content: WDL   Suicidal Thoughts:  No  Homicidal Thoughts:  No  Memory:  Immediate;   Good Recent;   Good Remote;   Good  Judgement:  Good  Insight:  Good  Psychomotor Activity:  Normal  Concentration:  Concentration: Good and Attention Span: Good  Recall:  Good  Fund of Knowledge: Good  Language: Good  Akathisia:  No  Handed:  Right  AIMS (if indicated): not done  Assets:  Communication Skills Desire for Improvement Resilience Social Support Talents/Skills  ADL's:  Intact  Cognition: WNL  Sleep:  Good   Screenings: AUDIT     [facility]  Alcohol Use Disorder Identification Test Final Score (AUDIT)  40       Assessment and Plan: This patient is a 62 year old female with a history of depression and anxiety.  She is doing much better now on her current regimen.  She will continue gabapentin 300 mg twice a day for  anxiety, clonazepam 1 mg daily at bedtime for sleep and anxiety Prozac 40 mg daily for depression and Wellbutrin XL 300 mg daily for depression.  She will return to see me in 4 months   Diannia Ruder, MD 03/13/2017, 10:30 AM

## 2017-03-16 MED FILL — ADVAIR 250/50 DISKUS: 250-50 | 30 days supply | Qty: 60 | Fill #5

## 2017-04-15 MED FILL — rOPINIRole HCL 0.5 MG TABS: 0.5 | 90 days supply | Qty: 90 | Fill #0

## 2017-04-16 MED FILL — ADVAIR 250/50 DISKUS: 250-50 | 90 days supply | Qty: 180 | Fill #0

## 2017-04-29 MED FILL — PANTOPRAZOLE SOD DR 40 MG T: 40 | 90 days supply | Qty: 90 | Fill #2

## 2017-05-06 MED FILL — GABAPENTIN 300 MG CAPSULE: 300 | 90 days supply | Qty: 180 | Fill #0

## 2017-06-08 MED FILL — BUPROPION HCL XL 300 MG TAB: 300 | 90 days supply | Qty: 90 | Fill #0

## 2017-06-08 MED FILL — FLUoxetine HCL 40 MG CAPS: 40 | 90 days supply | Qty: 90 | Fill #0

## 2017-07-14 MED FILL — rOPINIRole HCL 0.5 MG TABS: 0.5 | 90 days supply | Qty: 90 | Fill #1 | Status: TO

## 2017-07-15 ENCOUNTER — Encounter (HOSPITAL_COMMUNITY): Payer: Self-pay | Admitting: Psychiatry

## 2017-07-15 ENCOUNTER — Ambulatory Visit (HOSPITAL_COMMUNITY): Payer: Medicare Other | Admitting: Psychiatry

## 2017-07-15 VITALS — BP 149/89 | HR 68 | Ht 64.0 in | Wt 242.0 lb

## 2017-07-15 DIAGNOSIS — Z915 Personal history of self-harm: Secondary | ICD-10-CM

## 2017-07-15 DIAGNOSIS — Z818 Family history of other mental and behavioral disorders: Secondary | ICD-10-CM

## 2017-07-15 DIAGNOSIS — F419 Anxiety disorder, unspecified: Secondary | ICD-10-CM

## 2017-07-15 DIAGNOSIS — F1099 Alcohol use, unspecified with unspecified alcohol-induced disorder: Secondary | ICD-10-CM

## 2017-07-15 DIAGNOSIS — R251 Tremor, unspecified: Secondary | ICD-10-CM

## 2017-07-15 DIAGNOSIS — F333 Major depressive disorder, recurrent, severe with psychotic symptoms: Secondary | ICD-10-CM | POA: Diagnosis not present

## 2017-07-15 DIAGNOSIS — Z811 Family history of alcohol abuse and dependence: Secondary | ICD-10-CM | POA: Diagnosis not present

## 2017-07-15 MED ORDER — GABAPENTIN 300 MG PO CAPS: 300.0000 mg | ORAL_CAPSULE | Freq: Two times a day (BID) | ORAL | 2 refills | Status: DC

## 2017-07-15 MED ORDER — FLUOXETINE HCL 40 MG PO CAPS
40.0000 mg | ORAL_CAPSULE | Freq: Every day | ORAL | 2 refills | Status: DC
Start: 2017-07-15 — End: 2017-10-14

## 2017-07-15 MED ORDER — BUPROPION HCL ER (XL) 300 MG PO TB24
300.0000 mg | ORAL_TABLET | ORAL | 2 refills | Status: DC
Start: 2017-07-15 — End: 2017-10-14

## 2017-07-15 MED ORDER — CLONAZEPAM 1 MG PO TABS
1.0000 mg | ORAL_TABLET | Freq: Every day | ORAL | 3 refills | Status: DC
Start: 2017-07-15 — End: 2017-10-14

## 2017-07-15 MED FILL — clonazePAM 1 MG TABS: 1 | 30 days supply | Qty: 30 | Fill #0 | Status: TO

## 2017-07-15 NOTE — Progress Notes (Signed)
BH MD/PA/NP OP Progress Note  07/15/2017 10:12 AM Rebecca [name]  MRN:  [id number]  Chief Complaint:  Chief Complaint    Depression; Anxiety; Follow-up     HPI:   Patient is a 62 year-old separated white female who lives alone in [place]. 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 She has one daughter and 2 grandchildren  The patient was referred by the [facility] where she was hospitalized July 31 through August 6 after an intentional overdose on Ambien and oxycodone. The patient has had one prior overdose attempt in 2004. This occurred after she found that her first husband had had an affair with her sister years prior.  After the hospital patient the patient was followed for time by [facility] in [place]. She did fairly well on Paxil CR. Most recently she has been treated [facility] group primary care and Abilify was added. Over the last year however she has lost many important people in her life. 2 of her aunt her best friend another friend and her dog all died within a short period of time. She was going downhill and getting more and more depressed and was dealing with that by drinking 2-3 ounces of liquor per day. She got increasingly despondent and eventually decided to take her life and wrote a suicide note to her family. Reportedly she was found the next morning and brought to the ER in the ICU and consequently to the behavioral health hospital.  While there are Wellbutrin and trazodone were added. The patient wasn't sleeping well prior to hospitalization but she sleeping much better now. Her mood is improved but her energy and motivation are still poor and she's developed tremor in both hands predicted on the left. 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 It's hard for her to type  because of the tremor and she can't remember anything. She's no longer suicidal but just doesn't have much energy to do much of anything right now.   Patient returns after 4 months.  She states that a few weeks ago her father had a heart attack and had to have a triple bypass surgery.  He is currently doing well but she has been spending a lot of time in [place] with her parents.  She also has a lot of friends up there is so it has been a good place for her to be.  Her daughter is still not speaking to her but she has grown closer to her nieces and nephews.  Overall her mood has been good she has been active.  She has been sleeping well denies significant depression or anxiety  Visit Diagnosis:    ICD-10-CM   1. 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 Asthma   . Chronic pain began in 2011 after jooint replacements  . Depression   . Reflux     Past Surgical History:  Procedure Laterality Date  . ABDOMINAL HYSTERECTOMY    . FOOT SURGERY  2011 -both feet  . KNEE SURGERY     left knee  . SHOULDER SURGERY  2011    Family Psychiatric History: See below  Family History:  Family  History  Problem Relation Age of Onset  . Bipolar disorder Sister   . Alcohol abuse Maternal Grandfather   . Deep vein thrombosis Neg Hx   . Pulmonary embolism Neg Hx   . Heart disease Neg Hx   . Heart failure Neg Hx     Social History:  Social History   Socioeconomic History  . Marital status: Legally Separated    Spouse name: Not on file  . Number of children: Not on file  . Years of education: Not on file  . Highest education level: Not on file  Occupational History  . Not on file  Social Needs  . Financial resource strain: Not on file  . Food insecurity:    Worry: Not on file    Inability: Not on file  .  Transportation needs:    Medical: Not on file    Non-medical: Not on file  Tobacco Use  . Smoking status: Former Smoker    Types: Cigarettes    Last attempt to quit: 09/19/2009    Years since quitting: 7.8  . Smokeless tobacco: Never Used  Substance and Sexual Activity  . Alcohol use: No    Alcohol/week: 1.2 oz    Types: 2 Shots of liquor per week  . Drug use: No  . Sexual activity: Yes  Lifestyle  . Physical activity:    Days per week: Not on file    Minutes per session: Not on file  . Stress: Not on file  Relationships  . Social connections:    Talks on phone: Not on file    Gets together: Not on file    Attends religious service: Not on file    Active member of club or organization: Not on file    Attends meetings of clubs or organizations: Not on file    Relationship status: Not on file  Other Topics Concern  . Not on file  Social History Narrative  . Not on file    Allergies:  Allergies  Allergen Reactions  . Sulfa Antibiotics Other (See Comments)    unknown    Metabolic Disorder Labs: No results found for: HGBA1C, MPG No results found for: PROLACTIN No results found for: CHOL, TRIG, HDL, CHOLHDL, VLDL, LDLCALC Lab Results  Component Value Date   TSH 2.310 09/06/2013    Therapeutic Level Labs: No results found for: LITHIUM No results found for: VALPROATE No components found for:  CBMZ  Current Medications: Current Outpatient Medications  Medication Sig Dispense Refill  . albuterol (PROVENTIL HFA;VENTOLIN HFA) 108 (90 BASE) MCG/ACT inhaler Inhale 2 puffs into the lungs every 4 (four) hours as needed for wheezing or shortness of breath.    Marland Kitchen albuterol (PROVENTIL) (2.5 MG/3ML) 0.083% nebulizer solution Take 2.5 mg by nebulization every 4 (four) hours as needed for wheezing or shortness of breath.    Marland Kitchen buPROPion (WELLBUTRIN XL) 300 MG 24 hr tablet Take 1 tablet (300 mg total) by mouth every morning. 90 tablet 2  . clonazePAM (KLONOPIN) 1 MG tablet Take 1  tablet (1 mg total) by mouth at bedtime. 30 tablet 3  . FLUoxetine (PROZAC) 40 MG capsule Take 1 capsule (40 mg total) by mouth daily. 90 capsule 2  . Fluticasone-Salmeterol (ADVAIR) 250-50 MCG/DOSE AEPB Inhale 1 puff into the lungs 2 (two) times daily.    Marland Kitchen gabapentin (NEURONTIN) 300 MG capsule Take 1 capsule (300 mg total) by mouth 2 (two) times daily. For agitation/pain managment 180 capsule 2  . lidocaine (LIDODERM) 5 %  Place 1 patch onto the skin daily as needed (for pain). Remove & Discard patch within 12 hours or as directed by MD    . pantoprazole (PROTONIX) 40 MG tablet Take 1 tablet (40 mg total) by mouth daily. For acid reflux    . rOPINIRole (REQUIP) 0.5 MG tablet Take 1 tablet (0.5 mg total) by mouth at bedtime. For restless leg syndrome    . traMADol (ULTRAM) 50 MG tablet Take 50 mg by mouth 2 (two) times daily.     No current facility-administered medications for this visit.      Musculoskeletal: Strength & Muscle Tone: within normal limits Gait & Station: normal Patient leans: N/A  Psychiatric Specialty Exam: Review of Systems  All other systems reviewed and are negative.   Blood pressure (!) 149/89, pulse 68, height 5\' 4"  (1.626 m), weight 242 lb (109.8 kg), SpO2 95 %.Body mass index is 41.54 kg/m.  General Appearance: Casual, Neat and Well Groomed  Eye Contact:  Good  Speech:  Clear and Coherent  Volume:  Normal  Mood:  Euthymic  Affect:  Congruent  Thought Process:  Goal Directed  Orientation:  Full (Time, Place, and Person)  Thought Content: WDL   Suicidal Thoughts:  No  Homicidal Thoughts:  No  Memory:  Immediate;   Good Recent;   Good Remote;   Good  Judgement:  Good  Insight:  Good  Psychomotor Activity:  Normal  Concentration:  Concentration: Good and Attention Span: Good  Recall:  Good  Fund of Knowledge: Good  Language: Good  Akathisia:  No  Handed:  Right  AIMS (if indicated): not done  Assets:  Communication Skills Desire for  Improvement Physical Health Resilience Social Support Talents/Skills  ADL's:  Intact  Cognition: WNL  Sleep:  Good   Screenings: AUDIT     [facility]  Alcohol Use Disorder Identification Test Final Score (AUDIT)  40       Assessment and Plan: This patient is a 62 year old female with a history of depression and anxiety.  She seems to be doing well at the moment.  She will continue Wellbutrin XL 300 mg every morning, Prozac 40 mg daily both for depression, clonazepam 1 mg daily at bedtime for anxiety and sleep and gabapentin 300 mg twice a day for anxiety.  She will return to see me in 3 months   Diannia Rudereborah Taveon Enyeart, MD 07/15/2017, 10:12 AM

## 2017-07-21 MED FILL — ADVAIR 250/50 DISKUS: 250-50 | 90 days supply | Qty: 180 | Fill #1 | Status: TO

## 2017-07-28 MED FILL — PANTOPRAZOLE SOD DR 40 MG T: 40 | 90 days supply | Qty: 90 | Fill #3 | Status: TO

## 2017-08-04 MED FILL — GABAPENTIN 300 MG CAPSULE: 300 | 90 days supply | Qty: 180 | Fill #1 | Status: TO

## 2017-10-14 ENCOUNTER — Ambulatory Visit (HOSPITAL_COMMUNITY): Payer: Medicare Other | Admitting: Psychiatry

## 2017-10-14 ENCOUNTER — Encounter (HOSPITAL_COMMUNITY): Payer: Self-pay | Admitting: Psychiatry

## 2017-10-14 VITALS — BP 116/75 | HR 73 | Ht 64.0 in | Wt 252.0 lb

## 2017-10-14 DIAGNOSIS — Z818 Family history of other mental and behavioral disorders: Secondary | ICD-10-CM

## 2017-10-14 DIAGNOSIS — Z811 Family history of alcohol abuse and dependence: Secondary | ICD-10-CM | POA: Diagnosis not present

## 2017-10-14 DIAGNOSIS — F419 Anxiety disorder, unspecified: Secondary | ICD-10-CM | POA: Diagnosis not present

## 2017-10-14 DIAGNOSIS — F333 Major depressive disorder, recurrent, severe with psychotic symptoms: Secondary | ICD-10-CM | POA: Diagnosis not present

## 2017-10-14 DIAGNOSIS — Z87891 Personal history of nicotine dependence: Secondary | ICD-10-CM

## 2017-10-14 MED ORDER — CLONAZEPAM 1 MG PO TABS: 1.0000 mg | ORAL_TABLET | Freq: Every day | ORAL | 3 refills | Status: DC

## 2017-10-14 MED ORDER — GABAPENTIN 300 MG PO CAPS: 300.0000 mg | ORAL_CAPSULE | Freq: Two times a day (BID) | ORAL | 2 refills | Status: DC

## 2017-10-14 MED ORDER — BUPROPION HCL ER (XL) 300 MG PO TB24: 300.0000 mg | ORAL_TABLET | ORAL | 2 refills | Status: DC

## 2017-10-14 MED ORDER — FLUOXETINE HCL 40 MG PO CAPS: 40.0000 mg | ORAL_CAPSULE | Freq: Every day | ORAL | 2 refills | Status: DC

## 2017-10-14 NOTE — Progress Notes (Signed)
BH MD/PA/NP OP Progress Note  10/14/2017 10:33 AM Rebecca [name]  MRN:  [id number]  Chief Complaint:  Chief Complaint    Depression; Anxiety; Follow-up     HPI: Patient is a 62 year-old separated white female who lives alone in [place]. 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 She has one daughter and 2 grandchildren  The patient was referred by the [facility] where she was hospitalized July 31 through August 6 after an intentional overdose on Ambien and oxycodone. The patient has had one prior overdose attempt in 2004. This occurred after she found that her first husband had had an affair with her sister years prior.  After the hospital patient the patient was followed for time by [facility] in [place]. She did fairly well on Paxil CR. Most recently she has been treated [facility] primary care and Abilify was added. Over the last year however she has lost many important people in her life. 2 of her aunt her best friend another friend and her dog all died within a short period of time. She was going downhill and getting more and more depressed and was dealing with that by drinking 2-3 ounces of liquor per day. She got increasingly despondent and eventually decided to take her life and wrote a suicide note to her family. Reportedly she was found the next morning and brought to the ER in the ICU and consequently to the behavioral health hospital.  While there are Wellbutrin and trazodone were added. The patient wasn't sleeping well prior to hospitalization but she sleeping much better now. Her mood is improved but her energy and motivation are still poor and she's developed tremor in both hands predicted on the left. 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 It's hard for her to type  because of the tremor and she can't remember anything. She's no longer suicidal but just doesn't have much energy to do much of anything right now.  The patient returns after 3 months.  She states that she has had a good summer.  Her father had heart surgery a few months ago and she has been helping him but he is doing much better now.  She is getting out a lot with friends and staying active.  She states that her daughter still refuses to talk with her but she is made peace with this as there is nothing more she can do.  She is tried numerous times to make contact with the daughter and the daughter refuses.  She is sleeping well her energy is good and she denies any symptoms of depression or anxiety or suicidal ideation Visit Diagnosis:    ICD-10-CM   1. 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 Asthma   . Chronic pain began in 2011 after jooint replacements  . Depression   . Reflux     Past Surgical History:  Procedure Laterality Date  . ABDOMINAL HYSTERECTOMY    . FOOT SURGERY  2011 -both feet  . KNEE SURGERY     left knee  . SHOULDER SURGERY  2011    Family Psychiatric History: See below  Family History:  Family History  Problem Relation Age of Onset  . Bipolar disorder Sister   . Alcohol abuse Maternal Grandfather   . Deep vein thrombosis Neg Hx   . Pulmonary embolism Neg Hx   . Heart disease Neg Hx   . Heart failure Neg Hx     Social History:  Social History   Socioeconomic History  . Marital status: Legally Separated    Spouse name: Not on file  . Number of children: Not on file  . Years of education: Not on file  . Highest education level: Not on file  Occupational History  . Not on file  Social Needs  . Financial resource strain: Not on file  . Food insecurity:    Worry:  Not on file    Inability: Not on file  . Transportation needs:    Medical: Not on file    Non-medical: Not on file  Tobacco Use  . Smoking status: Former Smoker    Types: Cigarettes    Last attempt to quit: 09/19/2009    Years since quitting: 8.0  . Smokeless tobacco: Never Used  Substance and Sexual Activity  . Alcohol use: No    Alcohol/week: 2.0 standard drinks    Types: 2 Shots of liquor per week  . Drug use: No  . Sexual activity: Yes  Lifestyle  . Physical activity:    Days per week: Not on file    Minutes per session: Not on file  . Stress: Not on file  Relationships  . Social connections:    Talks on phone: Not on file    Gets together: Not on file    Attends religious service: Not on file    Active member of club or organization: Not on file    Attends meetings of clubs or organizations: Not on file    Relationship status: Not on file  Other Topics Concern  . Not on file  Social History Narrative  . Not on file    Allergies:  Allergies  Allergen Reactions  . Sulfa Antibiotics Other (See Comments)    unknown    Metabolic Disorder Labs: No results found for: HGBA1C, MPG No results found for: PROLACTIN No results found for: CHOL, TRIG, HDL, CHOLHDL, VLDL, LDLCALC Lab Results  Component Value Date   TSH 2.310 09/06/2013    Therapeutic Level Labs: No results found for: LITHIUM No results found for: VALPROATE No components found for:  CBMZ  Current Medications: Current Outpatient Medications  Medication Sig Dispense Refill  . albuterol (PROVENTIL HFA;VENTOLIN HFA) 108 (90 BASE) MCG/ACT inhaler Inhale 2 puffs into the lungs every 4 (four) hours as needed for wheezing or shortness of breath.    Marland Kitchen albuterol (PROVENTIL) (2.5 MG/3ML) 0.083% nebulizer solution Take 2.5 mg by nebulization every 4 (four) hours as needed for wheezing or shortness of breath.    Marland Kitchen buPROPion (WELLBUTRIN XL) 300 MG 24 hr tablet Take 1 tablet (300 mg total) by mouth every morning. 90  tablet 2  . clonazePAM (KLONOPIN) 1 MG tablet Take 1 tablet (1 mg total) by mouth at bedtime. 30 tablet 3  . FLUoxetine (PROZAC) 40 MG capsule Take 1 capsule (40 mg total) by mouth daily. 90 capsule 2  . Fluticasone-Salmeterol (ADVAIR) 250-50 MCG/DOSE AEPB Inhale 1 puff into the lungs 2 (two) times daily.    Marland Kitchen gabapentin (NEURONTIN) 300 MG capsule Take 1 capsule (300 mg total) by mouth 2 (two) times daily. For agitation/pain managment 180 capsule 2  . lidocaine (  LIDODERM) 5 % Place 1 patch onto the skin daily as needed (for pain). Remove & Discard patch within 12 hours or as directed by MD    . pantoprazole (PROTONIX) 40 MG tablet Take 1 tablet (40 mg total) by mouth daily. For acid reflux    . rOPINIRole (REQUIP) 0.5 MG tablet Take 1 tablet (0.5 mg total) by mouth at bedtime. For restless leg syndrome     No current facility-administered medications for this visit.      Musculoskeletal: Strength & Muscle Tone: within normal limits Gait & Station: normal Patient leans: N/A  Psychiatric Specialty Exam: Review of Systems  All other systems reviewed and are negative.   Blood pressure 116/75, pulse 73, height 5\' 4"  (1.626 m), weight 252 lb (114.3 kg), SpO2 96 %.Body mass index is 43.26 kg/m.  General Appearance: Casual, Neat and Well Groomed  Eye Contact:  Good  Speech:  Clear and Coherent  Volume:  Normal  Mood:  Euthymic  Affect:  Congruent  Thought Process:  Goal Directed  Orientation:  Full (Time, Place, and Person)  Thought Content: WDL   Suicidal Thoughts:  No  Homicidal Thoughts:  No  Memory:  Immediate;   Good Recent;   Good Remote;   Good  Judgement:  Fair  Insight:  Good  Psychomotor Activity:  Normal  Concentration:  Concentration: Good and Attention Span: Good  Recall:  Good  Fund of Knowledge: Good  Language: Good  Akathisia:  No  Handed:  Right  AIMS (if indicated): not done  Assets:  Communication Skills Desire for Improvement Resilience Social  Support Talents/Skills  ADL's:  Intact  Cognition: WNL  Sleep:  Good   Screenings: AUDIT     [facility]  Alcohol Use Disorder Identification Test Final Score (AUDIT)  40       Assessment and Plan: This patient is a 62 year old female with a history of depression and anxiety.  She is doing quite well on her current regimen.  She will continue Prozac 40 mg daily for depression as well as Wellbutrin XL 300 mg daily for depression, clonazepam 1 mg daily at bedtime for sleep and gabapentin 300 mg twice daily for anxiety.  She will return to see me in 4 months   Diannia Ruder, MD 10/14/2017, 10:33 AM

## 2018-02-12 ENCOUNTER — Other Ambulatory Visit: Payer: Self-pay | Admitting: Physician Assistant

## 2018-02-12 DIAGNOSIS — Z1231 Encounter for screening mammogram for malignant neoplasm of breast: Secondary | ICD-10-CM

## 2018-02-15 ENCOUNTER — Encounter (HOSPITAL_COMMUNITY): Payer: Self-pay | Admitting: Psychiatry

## 2018-02-15 ENCOUNTER — Ambulatory Visit (HOSPITAL_COMMUNITY): Payer: Medicare Other | Admitting: Psychiatry

## 2018-02-15 VITALS — BP 128/84 | HR 77 | Ht 64.0 in | Wt 261.6 lb

## 2018-02-15 DIAGNOSIS — F333 Major depressive disorder, recurrent, severe with psychotic symptoms: Secondary | ICD-10-CM

## 2018-02-15 MED ORDER — GABAPENTIN 300 MG PO CAPS: 300.0000 mg | ORAL_CAPSULE | Freq: Two times a day (BID) | ORAL | 2 refills | Status: DC

## 2018-02-15 MED ORDER — CLONAZEPAM 1 MG PO TABS: 1.0000 mg | ORAL_TABLET | Freq: Every day | ORAL | 3 refills | Status: DC

## 2018-02-15 MED ORDER — BUPROPION HCL ER (XL) 300 MG PO TB24: 300.0000 mg | ORAL_TABLET | ORAL | 2 refills | Status: DC

## 2018-02-15 MED ORDER — FLUOXETINE HCL 40 MG PO CAPS: 40.0000 mg | ORAL_CAPSULE | Freq: Every day | ORAL | 2 refills | Status: DC
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
## 2018-03-17 ENCOUNTER — Ambulatory Visit
Admission: RE | Admit: 2018-03-17 | Discharge: 2018-03-17 | Disposition: A | Discharge status: Home / Self Care | Payer: Medicare Other | Source: Ambulatory Visit | Attending: Physician Assistant | Admitting: Physician Assistant

## 2018-03-17 DIAGNOSIS — Z1231 Encounter for screening mammogram for malignant neoplasm of breast: Secondary | ICD-10-CM

## 2018-05-17 ENCOUNTER — Encounter (HOSPITAL_COMMUNITY): Payer: Self-pay | Admitting: Psychiatry

## 2018-05-17 ENCOUNTER — Ambulatory Visit (INDEPENDENT_AMBULATORY_CARE_PROVIDER_SITE_OTHER): Payer: Medicare Other | Admitting: Psychiatry

## 2018-05-17 ENCOUNTER — Other Ambulatory Visit: Payer: Self-pay

## 2018-05-17 DIAGNOSIS — F333 Major depressive disorder, recurrent, severe with psychotic symptoms: Secondary | ICD-10-CM

## 2018-05-17 MED ORDER — CLONAZEPAM 1 MG PO TABS: 1.0000 mg | ORAL_TABLET | Freq: Every day | ORAL | 3 refills | Status: DC

## 2018-05-17 MED ORDER — BUPROPION HCL ER (XL) 300 MG PO TB24: 300.0000 mg | ORAL_TABLET | ORAL | 2 refills | Status: DC

## 2018-05-17 MED ORDER — GABAPENTIN 300 MG PO CAPS: 300.0000 mg | ORAL_CAPSULE | Freq: Two times a day (BID) | ORAL | 2 refills | Status: DC

## 2018-05-17 MED ORDER — FLUOXETINE HCL 40 MG PO CAPS: 40.0000 mg | ORAL_CAPSULE | Freq: Every day | ORAL | 2 refills | Status: DC

## 2018-05-17 NOTE — Progress Notes (Signed)
Virtual Visit via Telephone Note  I connected with Rebecca Rebecca [name] on 05/17/18 at  2:00 PM EDT by telephone and verified that I am speaking with the correct person using two identifiers.   I discussed the limitations, risks, security and privacy concerns of performing an evaluation and management service by telephone and the availability of in person appointments. I also discussed with the Rebecca [name] that there may be a Rebecca [name] responsible charge related to this service. The Rebecca [name] expressed understanding and agreed to proceed.       I discussed the assessment and treatment plan with the Rebecca [name]. The Rebecca [name] was provided an opportunity to ask questions and all were answered. The Rebecca [name] agreed with the plan and demonstrated an understanding of the instructions.   The Rebecca [name] was advised to call back or seek an in-person evaluation if the symptoms worsen or if the condition fails to improve as anticipated.  I provided 15 minutes of non-face-to-face time during this encounter.   Rebecca Ruder, MD  [facility] MD/PA/NP OP Progress Note  05/17/2018 2:26 PM Rebecca Rebecca [name]  MRN:  [id number]  Chief Complaint:  Chief Complaint    Depression; Anxiety; Follow-up     HPI: Rebecca [name] is a 63year-old separated white female who lives alone in [place]. 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 She has one daughter and 2 grandchildren  The Rebecca [name] was referred by the [name] where she was hospitalized July 31 through August 6 after an intentional overdose on Ambien and oxycodone. The Rebecca [name] has had one prior overdose attempt in 2004. This occurred after she found that her first husband had had an affair with her sister years prior.  After the hospital Rebecca [name] the Rebecca [name] was followed for time by Rebecca Rebecca [name] in [place]. She did fairly well on Paxil CR. Most recently she has been treated [name] and Abilify was added. Over the last year however she has lost many important people in her life. 2 of her aunt her best friend another friend and her dog all died within a short period of time. She was going downhill and getting more and more depressed and was dealing with that by drinking 2-3 ounces of liquor per day. She got increasingly despondent and eventually decided to take her life and wrote a suicide note to her family. Reportedly she was found the next morning and brought to the ER in the ICU and consequently to the behavioral health hospital.  While there are Wellbutrin and trazodone were added. The Rebecca [name] wasn't sleeping well prior to hospitalization but she sleeping much better now. Her mood is improved but her energy and motivation are still poor and she's developed tremor in both hands predicted on the left. 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 It's hard for her to type because of the tremor and she can't remember anything. She's no longer suicidal but just doesn't have much energy to do much of anything right now  The Rebecca [name] is assessed through telephone interview today due to the coronavirus pandemic.  She states that she continues to do very well.  She has a lot of friends and she keeps in contact via social media.  She also has a new puppy which keeps her busy and active outside.  She is staying in and away from  people to avoid getting infected.  Her mood is good she is sleeping and eating well her energy is good and she denies suicidal ideation.  Her daughter still has not contacted her but she states that she has come to terms with this.   Visit Diagnosis:    ICD-10-CM   1. 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 Asthma   . Chronic pain began in 2011 after jooint replacements  . Depression   . Reflux     Past Surgical History:  Procedure Laterality Date  . ABDOMINAL HYSTERECTOMY    . BREAST CYST EXCISION Left   . FOOT SURGERY  2011 -both feet  . KNEE SURGERY     left knee  . SHOULDER SURGERY  2011    Family Psychiatric History: See below  Family History:  Family History  Problem Relation Age of Onset  . Bipolar disorder Sister   . Alcohol abuse Maternal Grandfather   . Breast cancer Maternal Aunt   . Breast cancer Maternal Grandmother   . Breast cancer Cousin   . Deep vein thrombosis Neg Hx   . Pulmonary embolism Neg Hx   . Heart disease Neg Hx   . Heart failure Neg Hx     Social History:  Social History   Socioeconomic History  . Marital status: Legally Separated    Spouse name: Not on file  . Number of children: Not on file  . Years of education: Not on file  . Highest education level: Not on file  Occupational History  . Not on file  Social Needs  . Financial resource strain: Not on file  . Food insecurity:    Worry: Not on file    Inability: Not on file  . Transportation needs:    Medical: Not on file    Non-medical: Not on file  Tobacco Use  . Smoking status: Former Smoker    Types: Cigarettes    Last attempt to quit: 09/19/2009    Years since quitting: 8.6  . Smokeless tobacco: Never Used  Substance and Sexual Activity  . Alcohol use: No    Alcohol/week: 2.0 standard drinks    Types: 2 Shots of liquor per week  . Drug use: No  . Sexual activity: Yes  Lifestyle  . Physical activity:    Days per week: Not on file    Minutes per session: Not on file  . Stress: Not on file  Relationships  . Social connections:    Talks on phone: Not on file    Gets together: Not on file    Attends religious service: Not on file    Active member of club or organization: Not on file    Attends meetings of clubs or  organizations: Not on file    Relationship status: Not on file  Other Topics Concern  . Not on file  Social History Narrative  . Not on file    Allergies:  Allergies  Allergen Reactions  . Sulfa Antibiotics Other (See Comments)    unknown    Metabolic Disorder Labs: No results found for: HGBA1C, MPG No results found for: PROLACTIN No results found for: CHOL, TRIG, HDL, CHOLHDL, VLDL, LDLCALC Lab Results  Component Value Date   TSH 2.310 09/06/2013    Therapeutic Level Labs: No results found for: LITHIUM  No results found for: VALPROATE No components found for:  CBMZ  Current Medications: Current Outpatient Medications  Medication Sig Dispense Refill  . albuterol (PROVENTIL HFA;VENTOLIN HFA) 108 (90 BASE) MCG/ACT inhaler Inhale 2 puffs into the lungs every 4 (four) hours as needed for wheezing or shortness of breath.    Marland Kitchen albuterol (PROVENTIL) (2.5 MG/3ML) 0.083% nebulizer solution Take 2.5 mg by nebulization every 4 (four) hours as needed for wheezing or shortness of breath.    Marland Kitchen buPROPion (WELLBUTRIN XL) 300 MG 24 hr tablet Take 1 tablet (300 mg total) by mouth every morning. 90 tablet 2  . clonazePAM (KLONOPIN) 1 MG tablet Take 1 tablet (1 mg total) by mouth at bedtime. 30 tablet 3  . FLUoxetine (PROZAC) 40 MG capsule Take 1 capsule (40 mg total) by mouth daily. 90 capsule 2  . Fluticasone-Salmeterol (ADVAIR) 250-50 MCG/DOSE AEPB Inhale 1 puff into the lungs 2 (two) times daily.    Marland Kitchen gabapentin (NEURONTIN) 300 MG capsule Take 1 capsule (300 mg total) by mouth 2 (two) times daily. For agitation/pain managment 180 capsule 2  . lidocaine (LIDODERM) 5 % Place 1 patch onto the skin daily as needed (for pain). Remove & Discard patch within 12 hours or as directed by MD    . pantoprazole (PROTONIX) 40 MG tablet Take 1 tablet (40 mg total) by mouth daily. For acid reflux    . rOPINIRole (REQUIP) 0.5 MG tablet Take 1 tablet (0.5 mg total) by mouth at bedtime. For restless leg  syndrome     No current facility-administered medications for this visit.      Musculoskeletal: Strength & Muscle Tone: Not assessed, phone interview Gait & Station:  Rebecca [name] leans:   Psychiatric Specialty Exam: Review of Systems  All other systems reviewed and are negative.   There were no vitals taken for this visit.There is no height or weight on file to calculate BMI.  General Appearance: NA  Eye Contact:  NA  Speech:  Clear and Coherent  Volume:  Normal  Mood:  Euthymic  Affect:  Congruent  Thought Process:  Goal Directed  Orientation:  Full (Time, Place, and Person)  Thought Content: WDL   Suicidal Thoughts:  No  Homicidal Thoughts:  No  Memory:  Immediate;   Good Recent;   Good Remote;   Good  Judgement:  Good  Insight:  Good  Psychomotor Activity:  Normal  Concentration:  Concentration: Good and Attention Span: Good  Recall:  Good  Fund of Knowledge: Good  Language: Good  Akathisia:  No  Handed:  Right  AIMS (if indicated): not done  Assets:  Communication Skills Desire for Improvement Physical Health Resilience Social Support Talents/Skills  ADL's:  Intact  Cognition: WNL  Sleep:  Good   Screenings: AUDIT     [facility]  Alcohol Use Disorder Identification Test Final Score (AUDIT)  40       Assessment and Plan: This Rebecca [name] is a 63 year old female with a history of depression.  She is doing quite well on her current regimen.  She will continue clonazepam 1 mg at bedtime for sleep and anxiety, gabapentin 300 mg twice daily for anxiety, Wellbutrin XL 300 mg daily for depression and Prozac 40 mg daily also for depression.  She will return to see me in 3 months   Rebecca Ruder, MD 05/17/2018, 2:26 PM

## 2018-05-27 IMAGING — MG 2D DIGITAL SCREENING BILATERAL MAMMOGRAM WITH CAD AND ADJUNCT TO
8 of 12 series · 8 of 28 positions shown · non-contrast
Comparison: Previous exam(s).

CLINICAL DATA: Screening.

EXAM:
2D DIGITAL SCREENING BILATERAL MAMMOGRAM WITH CAD AND ADJUNCT TOMO

[R CC synth-2D]
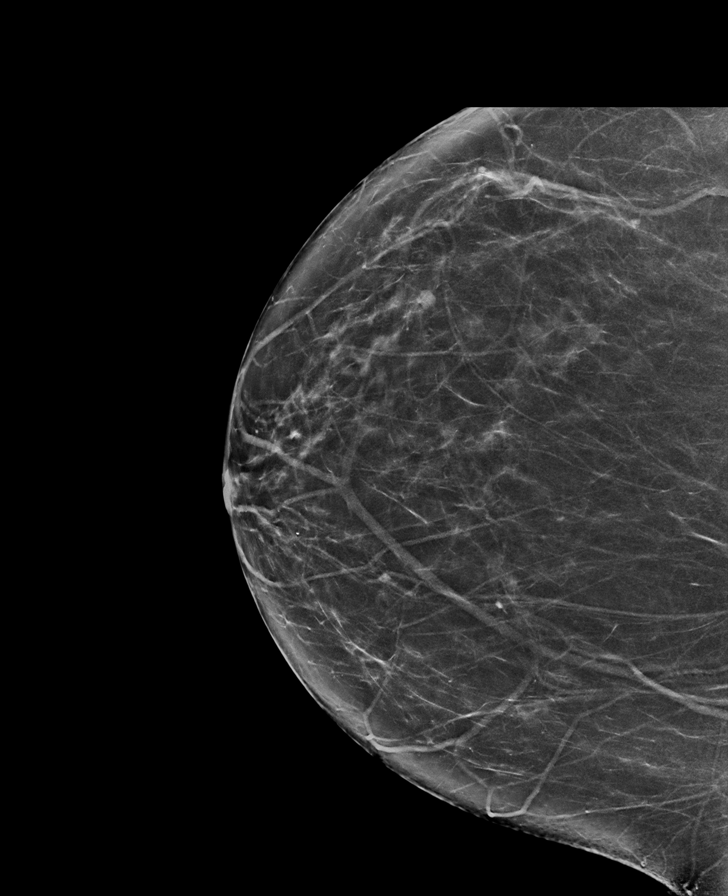

[L MLO synth-2D]
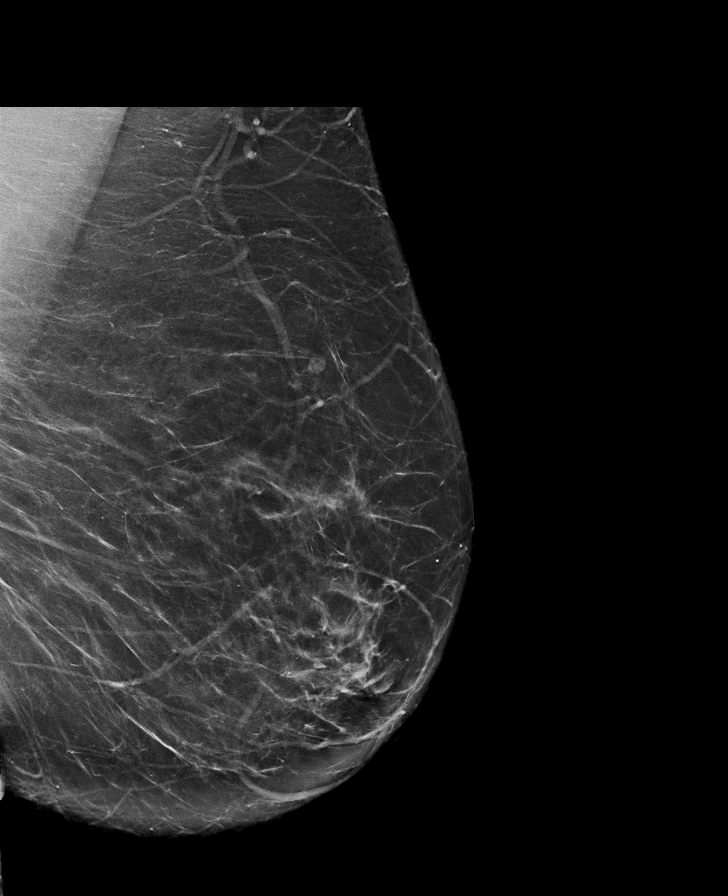

[L CC synth-2D]
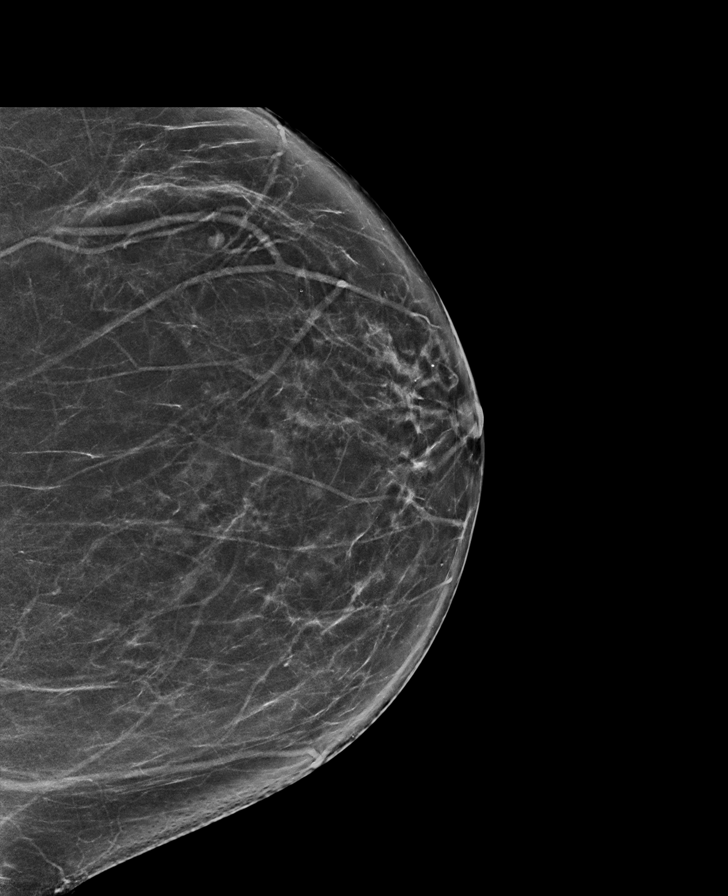

[L MLO]
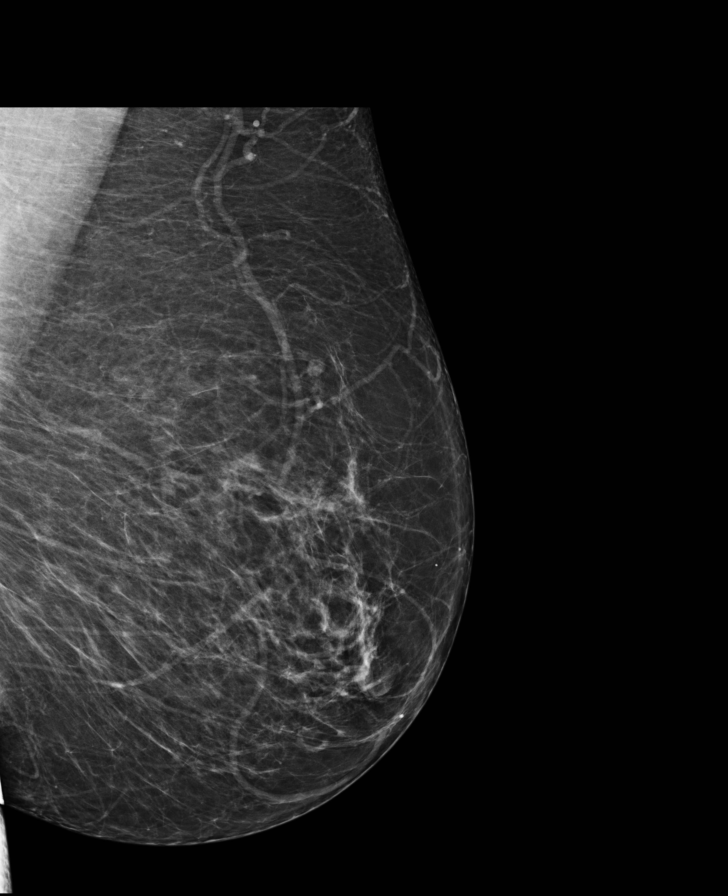

[R CC]
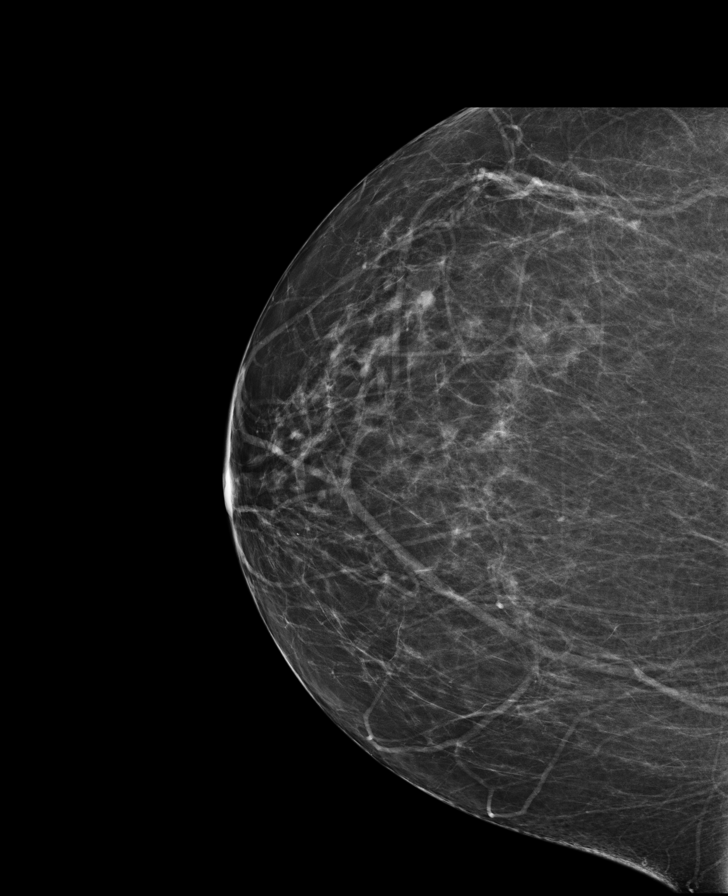

[R MLO synth-2D]
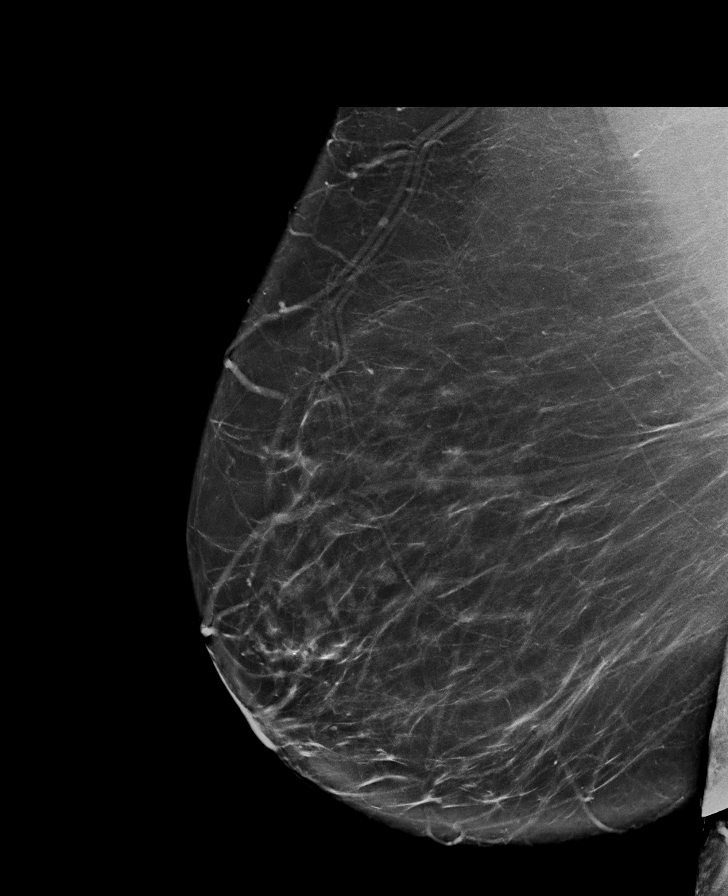

[L CC]
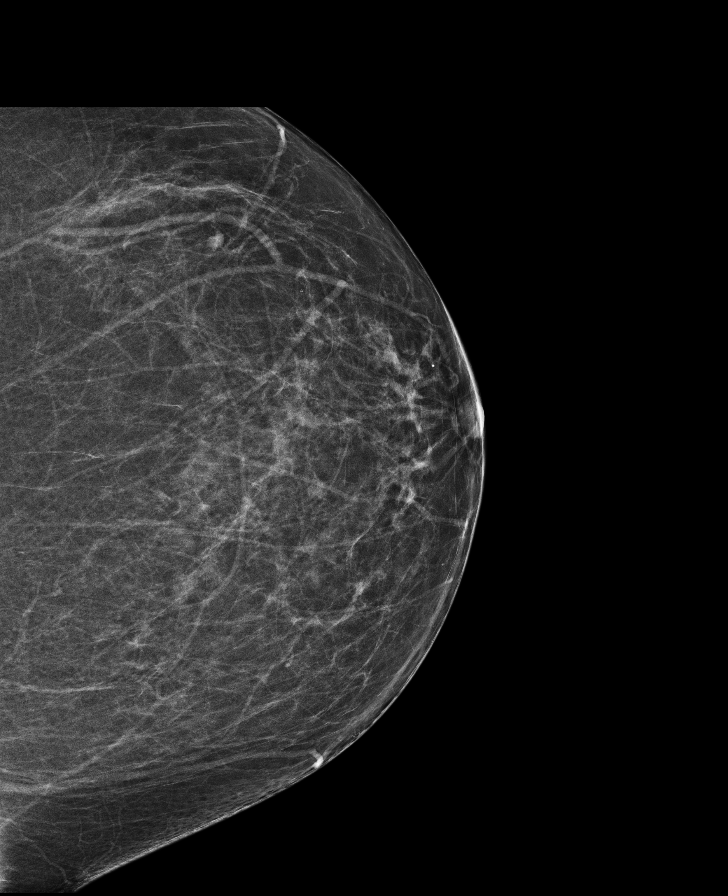

[R MLO]
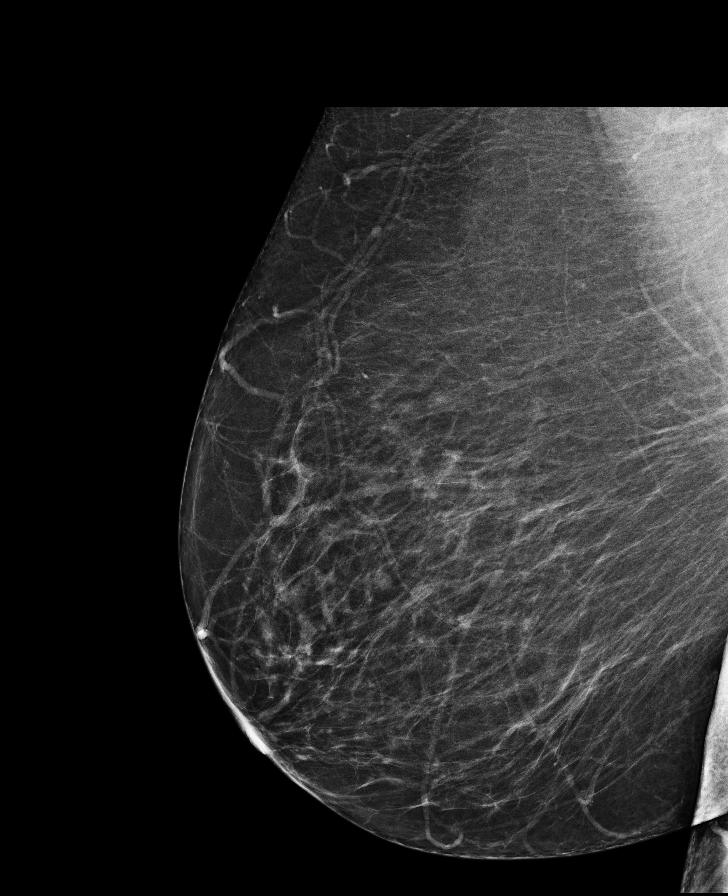

[8 of 28 positions shown; findings below may reference images not displayed]

ACR Breast Density Category c: The breast tissue is heterogeneously
dense, which may obscure small masses.
FINDINGS: There are no findings suspicious for malignancy. Images were
processed with CAD.
IMPRESSION: No mammographic evidence of malignancy. A result letter of this
screening mammogram will be mailed directly to the patient.

RECOMMENDATION:
Screening mammogram in one year. (Code:[id number])

BI-RADS CATEGORY  1: Negative.

## 2018-08-16 ENCOUNTER — Ambulatory Visit (INDEPENDENT_AMBULATORY_CARE_PROVIDER_SITE_OTHER): Payer: Medicare Other | Admitting: Psychiatry

## 2018-08-16 ENCOUNTER — Encounter (HOSPITAL_COMMUNITY): Payer: Self-pay | Admitting: Psychiatry

## 2018-08-16 ENCOUNTER — Other Ambulatory Visit: Payer: Self-pay

## 2018-08-16 DIAGNOSIS — F333 Major depressive disorder, recurrent, severe with psychotic symptoms: Secondary | ICD-10-CM | POA: Diagnosis not present

## 2018-08-16 MED ORDER — CLONAZEPAM 1 MG PO TABS: 1.0000 mg | ORAL_TABLET | Freq: Every day | ORAL | 5 refills | Status: DC

## 2018-08-16 MED ORDER — BUPROPION HCL ER (XL) 300 MG PO TB24: 300.0000 mg | ORAL_TABLET | ORAL | 2 refills | Status: DC

## 2018-08-16 MED ORDER — FLUOXETINE HCL 40 MG PO CAPS: 40.0000 mg | ORAL_CAPSULE | Freq: Every day | ORAL | 2 refills | Status: DC

## 2018-08-16 MED ORDER — GABAPENTIN 300 MG PO CAPS: 300.0000 mg | ORAL_CAPSULE | Freq: Two times a day (BID) | ORAL | 2 refills | Status: DC

## 2018-08-16 NOTE — Progress Notes (Signed)
Virtual Visit via Telephone Note  I connected with Rebecca [name] on 08/16/18 at 11:20 AM EDT by telephone and verified that I am speaking with the correct person using two identifiers.   I discussed the limitations, risks, security and privacy concerns of performing an evaluation and management service by telephone and the availability of in person appointments. I also discussed with the patient that there may be a patient responsible charge related to this service. The patient expressed understanding and agreed to proceed.     I discussed the assessment and treatment plan with the patient. The patient was provided an opportunity to ask questions and all were answered. The patient agreed with the plan and demonstrated an understanding of the instructions.   The patient was advised to call back or seek an in-person evaluation if the symptoms worsen or if the condition fails to improve as anticipated.  I provided 15 minutes of non-face-to-face time during this encounter.   Rebecca Spiller, MD  [facility] MD/PA/NP OP Progress Note  08/16/2018 11:26 AM Rebecca [name]  MRN:  [id number]  Chief Complaint:  Chief Complaint    Depression; Anxiety; Follow-up     HPI: Patient is a 63year-old separated white female who lives alone in [place]. 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 She has one daughter and 2 grandchildren  The patient was [facility] by [facility] where she was hospitalized July 31 through August 6 after an intentional overdose on Ambien and oxycodone. The patient has had one prior overdose attempt in 2004. This occurred after she found that her first husband had had an affair with her sister years prior.  After the hospital patient the patient was followed for time by [facility] in [place]. She did fairly well on Paxil CR. Most recently she has been treated [facility] group primary  care and Abilify was added. Over the last year however she has lost many important people in her life. 2 of her aunt her best friend another friend and her dog all died within a short period of time. She was going downhill and getting more and more depressed and was dealing with that by drinking 2-3 ounces of liquor per day. She got increasingly despondent and eventually decided to take her life and wrote a suicide note to her family. Reportedly she was found the next morning and brought to the ER in the ICU and consequently to the behavioral health hospital.  While there are Wellbutrin and trazodone were added. The patient wasn't sleeping well prior to hospitalization but she sleeping much better now. Her mood is improved but her energy and motivation are still poor and she's developed tremor in both hands predicted on the left. 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 It's hard for her to type because of the tremor and she can't remember anything. She's no longer suicidal but just doesn't have much energy to do much of anything right  The patient returns for follow-up today after 3 months and is assessed via telephone to the coronavirus pandemic.  She states that she is doing "great."  She spending a lot of time on her garden and doing housework.  She is not seeing a lot of people but she is okay with this and denies being lonely.  She keeps in touch with  her parents , niece and nephew.  Her daughter and her family are still not talking to the patient but she is okay with this.  She is sleeping well denies any depression or anxiety symptoms and denies suicidal ideation. Visit Diagnosis:    ICD-10-CM   1. Severe recurrent major depressive disorder with psychotic features (HCC)  F33.3     Past Psychiatric History: 1 prior psychiatric hospitalizations for depression  Past Medical History:  Past  Medical History:  Diagnosis Date  . Asthma   . Chronic pain began in 2011 after jooint replacements  . Depression   . Reflux     Past Surgical History:  Procedure Laterality Date  . ABDOMINAL HYSTERECTOMY    . BREAST CYST EXCISION Left   . FOOT SURGERY  2011 -both feet  . KNEE SURGERY     left knee  . SHOULDER SURGERY  2011    Family Psychiatric History: See below  Family History:  Family History  Problem Relation Age of Onset  . Bipolar disorder Sister   . Alcohol abuse Maternal Grandfather   . Breast cancer Maternal Aunt   . Breast cancer Maternal Grandmother   . Breast cancer Cousin   . Deep vein thrombosis Neg Hx   . Pulmonary embolism Neg Hx   . Heart disease Neg Hx   . Heart failure Neg Hx     Social History:  Social History   Socioeconomic History  . Marital status: Legally Separated    Spouse name: Not on file  . Number of children: Not on file  . Years of education: Not on file  . Highest education level: Not on file  Occupational History  . Not on file  Social Needs  . Financial resource strain: Not on file  . Food insecurity    Worry: Not on file    Inability: Not on file  . Transportation needs    Medical: Not on file    Non-medical: Not on file  Tobacco Use  . Smoking status: Former Smoker    Types: Cigarettes    Quit date: 09/19/2009    Years since quitting: 8.9  . Smokeless tobacco: Never Used  Substance and Sexual Activity  . Alcohol use: No    Alcohol/week: 2.0 standard drinks    Types: 2 Shots of liquor per week  . Drug use: No  . Sexual activity: Yes  Lifestyle  . Physical activity    Days per week: Not on file    Minutes per session: Not on file  . Stress: Not on file  Relationships  . Social Musicianconnections    Talks on phone: Not on file    Gets together: Not on file    Attends religious service: Not on file    Active member of club or organization: Not on file    Attends meetings of clubs or organizations: Not on file     Relationship status: Not on file  Other Topics Concern  . Not on file  Social History Narrative  . Not on file    Allergies:  Allergies  Allergen Reactions  . Sulfa Antibiotics Other (See Comments)    unknown    Metabolic Disorder Labs: No results found for: HGBA1C, MPG No results found for: PROLACTIN No results found for: CHOL, TRIG, HDL, CHOLHDL, VLDL, LDLCALC Lab Results  Component Value Date   TSH 2.310 09/06/2013    Therapeutic Level Labs: No results found for: LITHIUM No results found for: VALPROATE No components  found for:  CBMZ  Current Medications: Current Outpatient Medications  Medication Sig Dispense Refill  . albuterol (PROVENTIL HFA;VENTOLIN HFA) 108 (90 BASE) MCG/ACT inhaler Inhale 2 puffs into the lungs every 4 (four) hours as needed for wheezing or shortness of breath.    Marland Kitchen. albuterol (PROVENTIL) (2.5 MG/3ML) 0.083% nebulizer solution Take 2.5 mg by nebulization every 4 (four) hours as needed for wheezing or shortness of breath.    Marland Kitchen. buPROPion (WELLBUTRIN XL) 300 MG 24 hr tablet Take 1 tablet (300 mg total) by mouth every morning. 90 tablet 2  . clonazePAM (KLONOPIN) 1 MG tablet Take 1 tablet (1 mg total) by mouth at bedtime. 30 tablet 5  . FLUoxetine (PROZAC) 40 MG capsule Take 1 capsule (40 mg total) by mouth daily. 90 capsule 2  . Fluticasone-Salmeterol (ADVAIR) 250-50 MCG/DOSE AEPB Inhale 1 puff into the lungs 2 (two) times daily.    Marland Kitchen. gabapentin (NEURONTIN) 300 MG capsule Take 1 capsule (300 mg total) by mouth 2 (two) times daily. For agitation/pain managment 180 capsule 2  . lidocaine (LIDODERM) 5 % Place 1 patch onto the skin daily as needed (for pain). Remove & Discard patch within 12 hours or as directed by MD    . pantoprazole (PROTONIX) 40 MG tablet Take 1 tablet (40 mg total) by mouth daily. For acid reflux    . rOPINIRole (REQUIP) 0.5 MG tablet Take 1 tablet (0.5 mg total) by mouth at bedtime. For restless leg syndrome     No current  facility-administered medications for this visit.      Musculoskeletal: Strength & Muscle Tone: within normal limits Gait & Station: normal Patient leans: N/A  Psychiatric Specialty Exam: Review of Systems  All other systems reviewed and are negative.   There were no vitals taken for this visit.There is no height or weight on file to calculate BMI.  General Appearance:NA  Eye Contact:  Good  Speech:  Clear and Coherent  Volume:  Normal  Mood:  Euthymic  Affect:  NA  Thought Process:  Goal Directed  Orientation:  Full (Time, Place, and Person)  Thought Content: WDL   Suicidal Thoughts:  No  Homicidal Thoughts:  No  Memory:  Immediate;   Good Recent;   Good Remote;   Good  Judgement:  Good  Insight:  Good  Psychomotor Activity:  Normal  Concentration:  Concentration: Good and Attention Span: Good  Recall:  Good  Fund of Knowledge: Good  Language: Good  Akathisia:  No  Handed:  Right  AIMS (if indicated): not done  Assets:  Communication Skills Desire for Improvement Physical Health Resilience Social Support Talents/Skills  ADL's:  Intact  Cognition: WNL  Sleep:  Good   Screenings: AUDIT     [facility]  Alcohol Use Disorder Identification Test Final Score (AUDIT)  40       Assessment and Plan: This patient is a 63 year old female with a history of depression.  She continues to do well on her current regimen.  She will continue clonazepam 1 mg at bedtime for sleep and anxiety, gabapentin 300 mg twice daily for anxiety, Wellbutrin XL 300 mg daily for depression and Prozac 40 mg daily also for depression.  Since she is doing well she will return to see me in 6 months or call sooner if needed   Diannia Rudereborah Ross, MD 08/16/2018, 11:26 AM

## 2019-02-16 ENCOUNTER — Other Ambulatory Visit: Payer: Self-pay

## 2019-02-16 ENCOUNTER — Ambulatory Visit (INDEPENDENT_AMBULATORY_CARE_PROVIDER_SITE_OTHER): Payer: Medicare Other | Admitting: Psychiatry

## 2019-02-16 ENCOUNTER — Encounter (HOSPITAL_COMMUNITY): Payer: Self-pay | Admitting: Psychiatry

## 2019-02-16 DIAGNOSIS — F333 Major depressive disorder, recurrent, severe with psychotic symptoms: Secondary | ICD-10-CM

## 2019-02-16 MED ORDER — GABAPENTIN 300 MG PO CAPS: 300.0000 mg | ORAL_CAPSULE | Freq: Two times a day (BID) | ORAL | 2 refills | Status: DC

## 2019-02-16 MED ORDER — BUPROPION HCL ER (XL) 300 MG PO TB24: 300.0000 mg | ORAL_TABLET | ORAL | 2 refills | Status: DC

## 2019-02-16 MED ORDER — CLONAZEPAM 1 MG PO TABS: 1.0000 mg | ORAL_TABLET | Freq: Every day | ORAL | 5 refills | Status: DC

## 2019-02-16 MED ORDER — FLUOXETINE HCL 40 MG PO CAPS: 40.0000 mg | ORAL_CAPSULE | Freq: Every day | ORAL | 2 refills | Status: DC

## 2019-02-16 NOTE — Progress Notes (Signed)
Virtual Visit via Telephone Note  I connected with Rebecca [name] on 02/16/19 at  2:00 PM EST by telephone and verified that I am speaking with the correct person using two identifiers.   I discussed the limitations, risks, security and privacy concerns of performing an evaluation and management service by telephone and the availability of in person appointments. I also discussed with the patient that there may be a patient responsible charge related to this service. The patient expressed understanding and agreed to proceed.     I discussed the assessment and treatment plan with the patient. The patient was provided an opportunity to ask questions and all were answered. The patient agreed with the plan and demonstrated an understanding of the instructions.   The patient was advised to call back or seek an in-person evaluation if the symptoms worsen or if the condition fails to improve as anticipated.  I provided 15 minutes of non-face-to-face time during this encounter.   [name], MD  [facility] MD/PA/NP OP Progress Note  02/16/2019 2:06 PM Rebecca [name]  MRN:  [id number]  Chief Complaint:  Chief Complaint    Depression; Anxiety; Follow-up     HPI: Patient is a 64year-old separated white female who lives alone in [place]. 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 She has one daughter and 2 grandchildren  The patient was referred by [facility] where she was hospitalized July 31 through September 08, 2013 after an intentional overdose on Ambien and oxycodone. The patient has had one prior overdose attempt in 2004. This occurred after she found that her first husband had had an affair with her sister years prior.  After the hospital patient the patient was followed for time by [facility] in [place]. She did fairly well on Paxil CR. Most recently she has been treated [facility]  primary care and Abilify was added. Over the last year however she has lost many important people in her life. 2 of her aunt her best friend another friend and her dog all died within a short period of time. She was going downhill and getting more and more depressed and was dealing with that by drinking 2-3 ounces of liquor per day. She got increasingly despondent and eventually decided to take her life and wrote a suicide note to her family. Reportedly she was found the next morning and brought to the ER in the ICU and consequently to the behavioral health hospital.  While there are Wellbutrin and trazodone were added. The patient wasn't sleeping well prior to hospitalization but she sleeping much better now. Her mood is improved but her energy and motivation are still poor and she's developed tremor in both hands predicted on the left. 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 It's hard for her to type because of the tremor and she can't remember anything. She's no longer suicidal but just doesn't have much energy to do much of anything right  The patient returns for follow-up after 6 months.  She states that she is totally retired now.  She is enjoying it.  She has a 63-year-old dog whom she spends a lot of time taking in and out.  She is doing a lot of cooking for friends and neighbors.  She is staying mostly to her self because of the  coronavirus pandemic.  However she is in touch with people virtually both friends and family.  Her daughter and her family are still not speaking to the patient and it has now been 4 years.  The patient states that she has gotten used to it and there really is nothing more she can do to force her daughter to contact her.  The patient denies serious depression suicidal ideation thoughts of self-harm severe anxiety or panic attacks.  She sounds very bright and  cheerful Visit Diagnosis:    ICD-10-CM   1. Severe recurrent major depressive disorder with psychotic features (HCC)  F33.3     Past Psychiatric History: 2 prior psychiatric hospitalizations for depression in 2004 and 2015  Past Medical History:  Past Medical History:  Diagnosis Date  . Asthma   . Chronic pain began in 2011 after jooint replacements  . Depression   . Reflux     Past Surgical History:  Procedure Laterality Date  . ABDOMINAL HYSTERECTOMY    . BREAST CYST EXCISION Left   . FOOT SURGERY  2011 -both feet  . KNEE SURGERY     left knee  . SHOULDER SURGERY  2011    Family Psychiatric History: see below  Family History:  Family History  Problem Relation Age of Onset  . Bipolar disorder Sister   . Alcohol abuse Maternal Grandfather   . Breast cancer Maternal Aunt   . Breast cancer Maternal Grandmother   . Breast cancer Cousin   . Deep vein thrombosis Neg Hx   . Pulmonary embolism Neg Hx   . Heart disease Neg Hx   . Heart failure Neg Hx     Social History:  Social History   Socioeconomic History  . Marital status: Legally Separated    Spouse name: Not on file  . Number of children: Not on file  . Years of education: Not on file  . Highest education level: Not on file  Occupational History  . Not on file  Tobacco Use  . Smoking status: Former Smoker    Types: Cigarettes    Quit date: 09/19/2009    Years since quitting: 9.4  . Smokeless tobacco: Never Used  Substance and Sexual Activity  . Alcohol use: No    Alcohol/week: 2.0 standard drinks    Types: 2 Shots of liquor per week  . Drug use: No  . Sexual activity: Yes  Other Topics Concern  . Not on file  Social History Narrative  . Not on file   Social Determinants of Health   Financial Resource Strain:   . Difficulty of Paying Living Expenses: Not on file  Food Insecurity:   . Worried About Programme researcher, broadcasting/film/video in the Last Year: Not on file  . Ran Out of Food in the Last Year: Not on file   Transportation Needs:   . Lack of Transportation (Medical): Not on file  . Lack of Transportation (Non-Medical): Not on file  Physical Activity:   . Days of Exercise per Week: Not on file  . Minutes of Exercise per Session: Not on file  Stress:   . Feeling of Stress : Not on file  Social Connections:   . Frequency of Communication with Friends and Family: Not on file  . Frequency of Social Gatherings with Friends and Family: Not on file  . Attends Religious Services: Not on file  . Active Member of Clubs or Organizations: Not on file  . Attends Banker Meetings: Not  on file  . Marital Status: Not on file    Allergies:  Allergies  Allergen Reactions  . Sulfa Antibiotics Other (See Comments)    unknown    Metabolic Disorder Labs: No results found for: HGBA1C, MPG No results found for: PROLACTIN No results found for: CHOL, TRIG, HDL, CHOLHDL, VLDL, LDLCALC Lab Results  Component Value Date   TSH 2.310 09/06/2013    Therapeutic Level Labs: No results found for: LITHIUM No results found for: VALPROATE No components found for:  CBMZ  Current Medications: Current Outpatient Medications  Medication Sig Dispense Refill  . albuterol (PROVENTIL HFA;VENTOLIN HFA) 108 (90 BASE) MCG/ACT inhaler Inhale 2 puffs into the lungs every 4 (four) hours as needed for wheezing or shortness of breath.    Marland Kitchen albuterol (PROVENTIL) (2.5 MG/3ML) 0.083% nebulizer solution Take 2.5 mg by nebulization every 4 (four) hours as needed for wheezing or shortness of breath.    Marland Kitchen buPROPion (WELLBUTRIN XL) 300 MG 24 hr tablet Take 1 tablet (300 mg total) by mouth every morning. 90 tablet 2  . clonazePAM (KLONOPIN) 1 MG tablet Take 1 tablet (1 mg total) by mouth at bedtime. 30 tablet 5  . FLUoxetine (PROZAC) 40 MG capsule Take 1 capsule (40 mg total) by mouth daily. 90 capsule 2  . Fluticasone-Salmeterol (ADVAIR) 250-50 MCG/DOSE AEPB Inhale 1 puff into the lungs 2 (two) times daily.    Marland Kitchen  gabapentin (NEURONTIN) 300 MG capsule Take 1 capsule (300 mg total) by mouth 2 (two) times daily. For agitation/pain managment 180 capsule 2  . lidocaine (LIDODERM) 5 % Place 1 patch onto the skin daily as needed (for pain). Remove & Discard patch within 12 hours or as directed by MD    . pantoprazole (PROTONIX) 40 MG tablet Take 1 tablet (40 mg total) by mouth daily. For acid reflux    . rOPINIRole (REQUIP) 0.5 MG tablet Take 1 tablet (0.5 mg total) by mouth at bedtime. For restless leg syndrome     No current facility-administered medications for this visit.     Musculoskeletal: Strength & Muscle Tone: within normal limits Gait & Station: normal Patient leans: N/A  Psychiatric Specialty Exam: Review of Systems  All other systems reviewed and are negative.   There were no vitals taken for this visit.There is no height or weight on file to calculate BMI.  General Appearance: NA  Eye Contact:  NA  Speech:  Clear and Coherent  Volume:  Normal  Mood:  Euthymic  Affect:  NA  Thought Process:  Goal Directed  Orientation:  Full (Time, Place, and Person)  Thought Content: WDL   Suicidal Thoughts:  No  Homicidal Thoughts:  No  Memory:  Immediate;   Good Recent;   Good Remote;   Good  Judgement:  Good  Insight:  Good  Psychomotor Activity:  Normal  Concentration:  Concentration: Good and Attention Span: Good  Recall:  Good  Fund of Knowledge: Good  Language: Good  Akathisia:  No  Handed:  Right  AIMS (if indicated): not done  Assets:  Communication Skills Desire for Improvement Physical Health Resilience Social Support Talents/Skills  ADL's:  Intact  Cognition: WNL  Sleep:  Good   Screenings: AUDIT     [facility]  Alcohol Use Disorder Identification Test Final Score (AUDIT)  40       Assessment and Plan: This patient is a 64 year old female with a history of depression.  She  continues to do  well on her current regimen.  She will continue clonazepam 1 mg at bedtime for sleep and anxiety, gabapentin 300 mg twice daily for anxiety, Wellbutrin XL 300 mg as well as Prozac 40 mg daily for depression.  She will return to see me in 6 months or call sooner as needed   Diannia Ruder, MD 02/16/2019, 2:06 PM

## 2019-04-12 ENCOUNTER — Other Ambulatory Visit (HOSPITAL_COMMUNITY): Payer: Self-pay | Admitting: Psychiatry

## 2019-06-26 ENCOUNTER — Other Ambulatory Visit: Payer: Self-pay

## 2019-06-26 ENCOUNTER — Encounter (HOSPITAL_COMMUNITY): Payer: Self-pay | Admitting: *Deleted

## 2019-06-26 ENCOUNTER — Emergency Department (HOSPITAL_COMMUNITY): Payer: Medicare Other

## 2019-06-26 ENCOUNTER — Emergency Department (HOSPITAL_COMMUNITY)
Admission: EM | Admit: 2019-06-26 | Discharge: 2019-06-26 | Disposition: A | Discharge status: Home / Self Care | Payer: Medicare Other | Attending: Emergency Medicine | Admitting: Emergency Medicine

## 2019-06-26 DIAGNOSIS — X58XXXA Exposure to other specified factors, initial encounter: Secondary | ICD-10-CM | POA: Diagnosis not present

## 2019-06-26 DIAGNOSIS — S301XXA Contusion of abdominal wall, initial encounter: Secondary | ICD-10-CM

## 2019-06-26 DIAGNOSIS — Z8042 Family history of malignant neoplasm of prostate: Secondary | ICD-10-CM | POA: Insufficient documentation

## 2019-06-26 DIAGNOSIS — Z87891 Personal history of nicotine dependence: Secondary | ICD-10-CM | POA: Insufficient documentation

## 2019-06-26 DIAGNOSIS — Z20822 Contact with and (suspected) exposure to covid-19: Secondary | ICD-10-CM | POA: Insufficient documentation

## 2019-06-26 DIAGNOSIS — Y939 Activity, unspecified: Secondary | ICD-10-CM | POA: Insufficient documentation

## 2019-06-26 DIAGNOSIS — R1084 Generalized abdominal pain: Secondary | ICD-10-CM | POA: Insufficient documentation

## 2019-06-26 DIAGNOSIS — Y999 Unspecified external cause status: Secondary | ICD-10-CM | POA: Insufficient documentation

## 2019-06-26 DIAGNOSIS — R1012 Left upper quadrant pain: Secondary | ICD-10-CM | POA: Insufficient documentation

## 2019-06-26 DIAGNOSIS — J45909 Unspecified asthma, uncomplicated: Secondary | ICD-10-CM | POA: Diagnosis not present

## 2019-06-26 DIAGNOSIS — R1032 Left lower quadrant pain: Secondary | ICD-10-CM | POA: Diagnosis present

## 2019-06-26 DIAGNOSIS — Y929 Unspecified place or not applicable: Secondary | ICD-10-CM | POA: Diagnosis not present

## 2019-06-26 LAB — CBC WITH DIFFERENTIAL/PLATELET
Abs Immature Granulocytes: 0.11 10*3/uL — ABNORMAL HIGH (ref 0.00–0.07)
Basophils Absolute: 0.2 10*3/uL — ABNORMAL HIGH (ref 0.0–0.1)
Basophils Relative: 1 %
Eosinophils Absolute: 0.3 10*3/uL (ref 0.0–0.5)
Eosinophils Relative: 2 %
HCT: 33.4 % — ABNORMAL LOW (ref 36.0–46.0)
Hemoglobin: 10.5 g/dL — ABNORMAL LOW (ref 12.0–15.0)
Immature Granulocytes: 1 %
Lymphocytes Relative: 10 %
Lymphs Abs: 1.6 10*3/uL (ref 0.7–4.0)
MCH: 28.9 pg (ref 26.0–34.0)
MCHC: 31.4 g/dL (ref 30.0–36.0)
MCV: 92 fL (ref 80.0–100.0)
Monocytes Absolute: 0.7 10*3/uL (ref 0.1–1.0)
Monocytes Relative: 4 %
Neutro Abs: 13 10*3/uL — ABNORMAL HIGH (ref 1.7–7.7)
Neutrophils Relative %: 82 %
Platelets: 401 10*3/uL — ABNORMAL HIGH (ref 150–400)
RBC: 3.63 MIL/uL — ABNORMAL LOW (ref 3.87–5.11)
RDW: 15.6 % — ABNORMAL HIGH (ref 11.5–15.5)
WBC: 15.8 10*3/uL — ABNORMAL HIGH (ref 4.0–10.5)
nRBC: 0 % (ref 0.0–0.2)

## 2019-06-26 LAB — COMPREHENSIVE METABOLIC PANEL
ALT: 16 U/L (ref 0–44)
AST: 18 U/L (ref 15–41)
Albumin: 3.2 g/dL — ABNORMAL LOW (ref 3.5–5.0)
Alkaline Phosphatase: 97 U/L (ref 38–126)
Anion gap: 8 (ref 5–15)
BUN: 18 mg/dL (ref 8–23)
CO2: 24 mmol/L (ref 22–32)
Calcium: 8 mg/dL — ABNORMAL LOW (ref 8.9–10.3)
Chloride: 104 mmol/L (ref 98–111)
Creatinine, Ser: 0.75 mg/dL (ref 0.44–1.00)
GFR calc Af Amer: >60 mL/min (ref >60)
GFR calc non Af Amer: >60 mL/min (ref >60)
Glucose, Bld: 131 mg/dL — ABNORMAL HIGH (ref 70–99)
Potassium: 4.7 mmol/L (ref 3.5–5.1)
Sodium: 136 mmol/L (ref 135–145)
Total Bilirubin: 0.3 mg/dL (ref 0.3–1.2)
Total Protein: 5.8 g/dL — ABNORMAL LOW (ref 6.5–8.1)

## 2019-06-26 LAB — LIPASE, BLOOD: Lipase: 16 U/L (ref 11–51)

## 2019-06-26 LAB — SARS CORONAVIRUS 2 BY RT PCR (HOSPITAL ORDER, PERFORMED IN ~~LOC~~ HOSPITAL LAB): SARS Coronavirus 2: NEGATIVE

## 2019-06-26 MED ORDER — OXYCODONE-ACETAMINOPHEN 5-325 MG PO TABS: 1.0000 | ORAL_TABLET | Freq: Four times a day (QID) | ORAL | 0 refills | Status: DC | PRN

## 2019-06-26 MED ORDER — IOHEXOL 300 MG/ML  SOLN
100.0000 mL | Freq: Once | INTRAMUSCULAR | Status: AC | PRN
  Administered 2019-06-26: 100 mL via INTRAVENOUS

## 2019-06-26 MED ORDER — HYDROMORPHONE HCL 1 MG/ML IJ SOLN
1.0000 mg | Freq: Once | INTRAMUSCULAR | Status: AC
  Administered 2019-06-26: 1 mg via INTRAVENOUS
  Filled 2019-06-26: qty 1

## 2019-06-26 MED ORDER — SODIUM CHLORIDE 0.9 % IV BOLUS
1000.0000 mL | Freq: Once | INTRAVENOUS | Status: AC
  Administered 2019-06-26: 1000 mL via INTRAVENOUS

## 2019-06-26 MED ORDER — ONDANSETRON HCL 4 MG/2ML IJ SOLN
4.0000 mg | Freq: Once | INTRAMUSCULAR | Status: AC
  Administered 2019-06-26: 4 mg via INTRAVENOUS
  Filled 2019-06-26: qty 2

## 2019-06-26 MED ORDER — MORPHINE SULFATE (PF) 4 MG/ML IV SOLN
4.0000 mg | Freq: Once | INTRAVENOUS | Status: AC
  Administered 2019-06-26: 4 mg via INTRAVENOUS
  Filled 2019-06-26: qty 1

## 2019-06-26 NOTE — ED Provider Notes (Addendum)
[facility] EMERGENCY DEPARTMENT Provider Note   CSN: [id number] Arrival date & time: 06/26/19  6073     History Chief Complaint  Patient presents with  . Abdominal Pain    ROSALY LABARBERA is a 64 y.o. female with a history of asthma, depression, and prior abdominal hysterectomy who presents to the ED with complaints of abdominal pain that began yesterday.  Patient states that the pain is located in the left lower quadrant of the abdomen, somewhat radiates to left upper quadrant.  She states discomfort is constant, severe, no alleviating or aggravating factors.  She has had some associated urinary frequency and feels that her abdomen is getting distended.  Her last bowel movement was yesterday and was fairly normal.  Denies fever, chills, nausea, vomiting, dysuria, hematuria, diarrhea, melena, or hematochezia.  HPI     Past Medical History:  Diagnosis Date  . Asthma   . Chronic pain began in 2011 after jooint replacements  . Depression   . Reflux     Patient Active Problem List   Diagnosis Date Noted  . CAP (community acquired pneumonia) 12/01/2015  . Acute respiratory failure with hypoxia ([place]) 12/01/2015  . Acute asthma exacerbation 12/01/2015  . Lower extremity edema 09/06/2013  . Orthopnea 09/06/2013  . MDD (major depressive disorder) 09/04/2013  . Morbid obesity ([place]) 09/03/2013  . Drug overdose, intentional ([place]) 09/02/2013  . Intentional drug overdose ([place]) 09/02/2013  . Knee pain 09/12/2010  . Difficulty in walking(719.7) 09/12/2010  . Stiffness of joint, not elsewhere classified, lower leg 09/12/2010  . S/P total knee replacement 09/12/2010  . Asthma, chronic     Past Surgical History:  Procedure Laterality Date  . ABDOMINAL HYSTERECTOMY    . BREAST CYST EXCISION Left   . FOOT SURGERY  2011 -both feet  . KNEE SURGERY     left knee  . SHOULDER SURGERY  2011     OB History   No obstetric history on file.     Family History  Problem Relation Age  of Onset  . Bipolar disorder Sister   . Alcohol abuse Maternal Grandfather   . Breast cancer Maternal Aunt   . Breast cancer Maternal Grandmother   . Breast cancer Cousin   . Deep vein thrombosis Neg Hx   . Pulmonary embolism Neg Hx   . Heart disease Neg Hx   . Heart failure Neg Hx     Social History   Tobacco Use  . Smoking status: Former Smoker    Types: Cigarettes    Quit date: 09/19/2009    Years since quitting: 9.7  . Smokeless tobacco: Never Used  Substance Use Topics  . Alcohol use: No    Alcohol/week: 2.0 standard drinks    Types: 2 Shots of liquor per week  . Drug use: No    Home Medications Prior to Admission medications   Medication Sig Start Date End Date Taking? Authorizing Provider  albuterol (PROVENTIL HFA;VENTOLIN HFA) 108 (90 BASE) MCG/ACT inhaler Inhale 2 puffs into the lungs every 4 (four) hours as needed for wheezing or shortness of breath. 09/08/13   Lindell Spar I, NP  albuterol (PROVENTIL) (2.5 MG/3ML) 0.083% nebulizer solution Take 2.5 mg by nebulization every 4 (four) hours as needed for wheezing or shortness of breath.    [provider]  buPROPion (WELLBUTRIN XL) 300 MG 24 hr tablet Take 1 tablet (300 mg total) by mouth every morning. 02/16/19 02/16/20  Cloria Spring, MD  clonazePAM (KLONOPIN) 1 MG  tablet Take 1 tablet (1 mg total) by mouth at bedtime. 02/16/19 02/16/20  Myrlene Broker, MD  FLUoxetine (PROZAC) 40 MG capsule Take 1 capsule (40 mg total) by mouth daily. 02/16/19 02/16/20  Myrlene Broker, MD  Fluticasone-Salmeterol (ADVAIR) 250-50 MCG/DOSE AEPB Inhale 1 puff into the lungs 2 (two) times daily.    [provider]  gabapentin (NEURONTIN) 300 MG capsule Take 1 capsule (300 mg total) by mouth 2 (two) times daily. For agitation/pain managment 02/16/19   Myrlene Broker, MD  lidocaine (LIDODERM) 5 % Place 1 patch onto the skin daily as needed (for pain). Remove & Discard patch within 12 hours or as directed by MD    [provider]  pantoprazole (PROTONIX) 40 MG tablet Take 1 tablet (40 mg total) by mouth daily. For acid reflux 09/08/13   Armandina Stammer I, NP  rOPINIRole (REQUIP) 0.5 MG tablet Take 1 tablet (0.5 mg total) by mouth at bedtime. For restless leg syndrome 09/08/13   Armandina Stammer I, NP    Allergies    Sulfa antibiotics  Review of Systems   Review of Systems  Constitutional: Negative for chills and fever.  Respiratory: Negative for shortness of breath.   Cardiovascular: Negative for chest pain.  Gastrointestinal: Positive for abdominal pain. Negative for blood in stool, constipation, diarrhea, nausea and vomiting.  Genitourinary: Positive for frequency. Negative for dysuria and hematuria.  Neurological: Negative for syncope.  All other systems reviewed and are negative.  Physical Exam Updated Vital Signs BP 108/77 (BP Location: Left Arm)   Pulse 92   Temp 98.1 F (36.7 C) (Oral)   Resp (!) 22   Ht 5\' 3"  (1.6 m)   Wt 113.4 kg   SpO2 99%   BMI 44.29 kg/m   Physical Exam Vitals and nursing note reviewed.  Constitutional:      General: She is not in acute distress.    Appearance: She is well-developed. She is not toxic-appearing.  HENT:     Head: Normocephalic and atraumatic.  Eyes:     General:        Right eye: No discharge.        Left eye: No discharge.     Conjunctiva/sclera: Conjunctivae normal.  Cardiovascular:     Rate and Rhythm: Normal rate and regular rhythm.  Pulmonary:     Effort: Pulmonary effort is normal. No respiratory distress.     Breath sounds: Normal breath sounds. No wheezing, rhonchi or rales.  Abdominal:     General: There is no distension.     Palpations: Abdomen is soft.     Tenderness: There is abdominal tenderness (Generalized, most prominent in the left lower quadrant. L mid abdomen is somewhat firm. ). There is no right CVA tenderness, left CVA tenderness, guarding or rebound.  Musculoskeletal:     Cervical back: Neck supple.  Skin:    General:  Skin is warm and dry.     Findings: No rash.  Neurological:     Mental Status: She is alert.     Comments: Clear speech.   Psychiatric:        Behavior: Behavior normal.    ED Results / Procedures / Treatments   Labs (all labs ordered are listed, but only abnormal results are displayed) Labs Reviewed  COMPREHENSIVE METABOLIC PANEL - Abnormal; Notable for the following components:      Result Value   Glucose, Bld 131 (*)    Calcium 8.0 (*)  Total Protein 5.8 (*)    Albumin 3.2 (*)    All other components within normal limits  CBC WITH DIFFERENTIAL/PLATELET - Abnormal; Notable for the following components:   WBC 15.8 (*)    RBC 3.63 (*)    Hemoglobin 10.5 (*)    HCT 33.4 (*)    RDW 15.6 (*)    Platelets 401 (*)    Neutro Abs 13.0 (*)    Basophils Absolute 0.2 (*)    Abs Immature Granulocytes 0.11 (*)    All other components within normal limits  SARS CORONAVIRUS 2 BY RT PCR (HOSPITAL ORDER, PERFORMED IN [facility])  LIPASE, BLOOD    EKG None  Radiology CT Abdomen Pelvis W Contrast  Result Date: 06/26/2019 CLINICAL DATA:  Abdominal pain on the left EXAM: CT ABDOMEN AND PELVIS WITH CONTRAST TECHNIQUE: Multidetector CT imaging of the abdomen and pelvis was performed using the standard protocol following bolus administration of intravenous contrast. CONTRAST:  OMNIPAQUE IOHEXOL 300 MG/ML  SOLN COMPARISON:  None. FINDINGS: Lower chest: No acute abnormality. Hepatobiliary: No focal liver abnormality is seen. No gallstones, gallbladder wall thickening, or biliary dilatation. Pancreas: Unremarkable. No pancreatic ductal dilatation or surrounding inflammatory changes. Spleen: Normal in size without focal abnormality. Adrenals/Urinary Tract: Adrenal glands are within normal limits. Kidneys are well visualize without renal calculi or obstructive changes. Bladder is well distended. Stomach/Bowel: The appendix is within normal limits. No obstructive or inflammatory  changes of the colon are noted. The small bowel and stomach are within normal limits. Vascular/Lymphatic: No significant vascular findings are present. No enlarged abdominal or pelvic lymph nodes. Reproductive: Status post hysterectomy. No adnexal masses. Other: There is a large hematoma identified in the left rectus muscle which measures 10.6 x 8.8 cm in greatest transverse and AP dimension. It extends for approximately 20 cm in craniocaudad projection. Additionally some hemorrhage into the pelvic space is noted with mild mass effect upon the bladder. No free fluid is noted. Musculoskeletal: Degenerative changes of lumbar spine are noted. IMPRESSION: Large left rectus hematoma with some extension into the pelvic space. No definitive active extravasation is noted at this time. Correlate with any recent history of blunt trauma. Electronically Signed   By: Alcide Clever M.D.   On: 06/26/2019 13:47    Procedures Procedures (including critical care time)  Medications Ordered in ED Medications  sodium chloride 0.9 % bolus 1,000 mL (0 mLs Intravenous Stopped 06/26/19 1127)  ondansetron (ZOFRAN) injection 4 mg (4 mg Intravenous Given 06/26/19 0933)  morphine 4 MG/ML injection 4 mg (4 mg Intravenous Given 06/26/19 0935)  HYDROmorphone (DILAUDID) injection 1 mg (1 mg Intravenous Given 06/26/19 1211)  iohexol (OMNIPAQUE) 300 MG/ML solution 100 mL (100 mLs Intravenous Contrast Given 06/26/19 1255)    ED Course  I have reviewed the triage vital signs and the nursing notes.  Pertinent labs & imaging results that were available during my care of the patient were reviewed by me and considered in my medical decision making (see chart for details).    MDM Rules/Calculators/A&P                     Patient presents to the ED with complaints of abdominal pain. Nontoxic, vitals without significant abnormality.  Patient has generalized abdominal tenderness most focal in the left lower quadrant.   Ddx: Nephrolithiasis,  diverticulitis, perforation, obstruction, colitis, pyelonephritis, mass, abscess  Additional history obtained:  Additional history obtained from chart review and nursing note reviewed. Lab  Tests:  I Ordered, reviewed, and interpreted labs, which included:  CBC: Leukocytosis of 15.8.  Anemia which is new, however most recent labs were 3 years prior. CMP: Mild hypocalcemia, no significant electrolyte derangement. Renal function & LFTs WNL.  Lipase: WNL  Imaging Studies ordered:  I ordered imaging studies which included CT A/P w contrast, I independently visualized and interpreted imaging which showed Large left rectus hematoma with some extension into the pelvic space. No definitive active extravasation is noted at this time.  ED Course:  Initial assessment of the patient I have ordered morphine and Zofran for pain management as well as fluids.  12:05: RE-EVAL: Patient initially had some pain relief with morphine, however pain has returned, will give 1 mg of Dilaudid.  Patient pending CT abdomen/pelvis, waiting on renal function at this time.  13:34: RE-EVAL: Patient feeling much better, pain well controlled.  13:59: RE-EVAL: Further discussed with the patient, she notes that she did have a fall 3 to 4 days ago, but did not think much of it.  She is not anticoagulated.  She remains with good pain control.  Suspect hematoma secondary to fall and that hematoma is leading to mild anemia.   14:40: CONSULT: Discussed with general surgeon Dr. Lovell Sheehan, OK to discharge, no NSAIDs/aspirin, can follow up in clinic. Appreciate consultation.   I discussed results, treatment plan, need for follow-up, and return precautions with the patient. Provided opportunity for questions, patient confirmed understanding and is in agreement with plan.   Findings and plan of care discussed with supervising physician Dr. Deretha Emory who is in agreement.   Portions of this note were generated with [employer]. Dictation errors may occur despite best attempts at proofreading.  Final Clinical Impression(s) / ED Diagnoses Final diagnoses:  Traumatic rectus hematoma, initial encounter    Rx / DC Orders ED Discharge Orders         Ordered    oxyCODONE-acetaminophen (PERCOCET/ROXICET) 5-325 MG tablet  Every 6 hours PRN     06/26/19 1532           Chace Klippel, Pleas Koch, PA-C 06/26/19 1603  Patient requested alternative pharmacy as prior was closed, prescription modified.    Desmond Lope 06/26/19 1633    Vanetta Mulders, MD 07/26/19 [phone]

## 2019-06-26 NOTE — ED Triage Notes (Signed)
Patient presents to the ED with sudden left flank pain that began at 6 am today.  Patient has received Advil and Toradal by EMS with no relief.  Per EMS vital signs WDL, CBG 129, EKG read NSR.

## 2019-06-26 NOTE — Discharge Instructions (Addendum)
You were seen in the emergency department today for abdominal pain.  Your CT scan showed that you have a hematoma to your abdomen which we suspect is causing your pain.  Your hemoglobin is mildly low at 10.5 which is likely from the hematoma.  This should likely absorb itself.  We are sending you home with Percocet to help with pain.  -Percocet-this is a narcotic/controlled substance medication that has potential addicting qualities.  We recommend that you take 1-2 tablets every 6 hours as needed for severe pain.  Do not drive or operate heavy machinery when taking this medicine as it can be sedating. Do not drink alcohol or take other sedating medications when taking this medicine for safety reasons.  Keep this out of reach of small children.  Please be aware this medicine has Tylenol in it (325 mg/tab) do not exceed the maximum dose of Tylenol in a day per over the counter recommendations should you decide to supplement with Tylenol over the counter.   We have prescribed you new medication(s) today. Discuss the medications prescribed today with your pharmacist as they can have adverse effects and interactions with your other medicines including over the counter and prescribed medications. Seek medical evaluation if you start to experience new or abnormal symptoms after taking one of these medicines, seek care immediately if you start to experience difficulty breathing, feeling of your throat closing, facial swelling, or rash as these could be indications of a more serious allergic reaction  Do not take any nonsteroidal anti-inflammatory medication such as ibuprofen, Advil, Motrin, naproxen, Goody powders, etc. and do not take aspirin.  Do not take blood thinners.  You may follow-up with general surgeon Dr. Lovell Sheehan, please call his office for follow-up tomorrow morning.  If you are feeling much better he did not need to follow-up with him he can follow-up with your primary care provider.  Return to the ER  for new or worsening symptoms including but not limited to increased pain, expanding swelling, blood in your urine or stool, lightheadedness or dizziness, passing out, or any other concerns.

## 2019-07-05 ENCOUNTER — Ambulatory Visit: Payer: Medicare Other | Admitting: General Surgery

## 2019-07-05 ENCOUNTER — Other Ambulatory Visit: Payer: Self-pay

## 2019-07-05 ENCOUNTER — Encounter: Payer: Self-pay | Admitting: General Surgery

## 2019-07-05 VITALS — BP 120/78 | HR 90 | Temp 98.2°F | Resp 90 | Ht 63.0 in | Wt 259.0 lb

## 2019-07-05 DIAGNOSIS — S301XXA Contusion of abdominal wall, initial encounter: Secondary | ICD-10-CM | POA: Diagnosis not present

## 2019-07-06 NOTE — Progress Notes (Signed)
Rebecca [name]; [id number]; 1955-12-30   HPI Patient is a 64 year old white female who was referred to my care for follow-up of an abdominal wall hematoma that was found in the emergency room on 06/26/2019.  The patient states she fell against something a week earlier and then presented to the emergency room with abdominal pain.  CT scan of the abdomen revealed a very large left rectus abdominis hematoma.  I was consulted by telephone and told the emergency room that there was no indication for acute surgical intervention.  Since that time, the patient has been doing well and states her abdominal pain has eased.  She denies any lightheadedness.  She has 0 out of 10 abdominal pain.  She does take ibuprofen chronically for arthritic pain.  She does not take aspirin or BC powders. Past Medical History:  Diagnosis Date  . Asthma   . Chronic pain began in 2011 after jooint replacements  . Depression   . Reflux     Past Surgical History:  Procedure Laterality Date  . ABDOMINAL HYSTERECTOMY    . BREAST CYST EXCISION Left   . FOOT SURGERY  2011 -both feet  . KNEE SURGERY     left knee  . SHOULDER SURGERY  2011    Family History  Problem Relation Age of Onset  . Bipolar disorder Sister   . Alcohol abuse Maternal Grandfather   . Breast cancer Maternal Aunt   . Breast cancer Maternal Grandmother   . Breast cancer Cousin   . Deep vein thrombosis Neg Hx   . Pulmonary embolism Neg Hx   . Heart disease Neg Hx   . Heart failure Neg Hx     Current Outpatient Medications on File Prior to Visit  Medication Sig Dispense Refill  . albuterol (PROVENTIL HFA;VENTOLIN HFA) 108 (90 BASE) MCG/ACT inhaler Inhale 2 puffs into the lungs every 4 (four) hours as needed for wheezing or shortness of breath.    Marland Kitchen albuterol (PROVENTIL) (2.5 MG/3ML) 0.083% nebulizer solution Take 2.5 mg by nebulization every 4 (four) hours as needed for wheezing or shortness of breath.    Marland Kitchen buPROPion (WELLBUTRIN XL) 300 MG 24 hr  tablet Take 1 tablet (300 mg total) by mouth every morning. 90 tablet 2  . clonazePAM (KLONOPIN) 1 MG tablet Take 1 tablet (1 mg total) by mouth at bedtime. 30 tablet 5  . FLUoxetine (PROZAC) 40 MG capsule Take 1 capsule (40 mg total) by mouth daily. 90 capsule 2  . gabapentin (NEURONTIN) 300 MG capsule Take 1 capsule (300 mg total) by mouth 2 (two) times daily. For agitation/pain managment 180 capsule 2  . pantoprazole (PROTONIX) 40 MG tablet Take 1 tablet (40 mg total) by mouth daily. For acid reflux    . rOPINIRole (REQUIP) 0.5 MG tablet Take 1 tablet (0.5 mg total) by mouth at bedtime. For restless leg syndrome    . SYMBICORT 160-4.5 MCG/ACT inhaler Inhale 2 puffs into the lungs 2 (two) times daily.     No current facility-administered medications on file prior to visit.    Allergies  Allergen Reactions  . Sulfa Antibiotics Other (See Comments)    unknown    Social History   Substance and Sexual Activity  Alcohol Use No  . Alcohol/week: 2.0 standard drinks  . Types: 2 Shots of liquor per week    Social History   Tobacco Use  Smoking Status Former Smoker  . Types: Cigarettes  . Quit date: 09/19/2009  . Years since quitting:  9.8  Smokeless Tobacco Never Used    Review of Systems  Constitutional: Negative.   HENT: Negative.   Respiratory: Positive for cough, shortness of breath and wheezing.   Gastrointestinal: Positive for abdominal pain.  Genitourinary: Positive for frequency and urgency.  Musculoskeletal: Positive for back pain and joint pain.  Skin: Negative.   Neurological: Negative.   Endo/Heme/Allergies: Negative.   Psychiatric/Behavioral: Negative.     Objective   Vitals:   07/05/19 1423  BP: 120/78  Pulse: 90  Resp: (!) 90  Temp: 98.2 F (36.8 C)  SpO2: 94%    Physical Exam Vitals reviewed.  Constitutional:      Appearance: Normal appearance. She is obese. She is not ill-appearing.  HENT:     Head: Normocephalic and atraumatic.   Cardiovascular:     Rate and Rhythm: Regular rhythm.     Heart sounds: Normal heart sounds. No murmur. No friction rub. No gallop.   Pulmonary:     Effort: Pulmonary effort is normal. No respiratory distress.     Breath sounds: Normal breath sounds. No stridor. No wheezing, rhonchi or rales.  Abdominal:     General: There is no distension.     Palpations: Abdomen is soft. There is no mass.     Tenderness: There is no abdominal tenderness. There is no guarding or rebound.     Hernia: No hernia is present.     Comments: No ecchymosis or palpable mass noted in the left rectus muscle or abdominal wall.  Skin:    General: Skin is warm and dry.  Neurological:     Mental Status: She is alert and oriented to person, place, and time.   ER note reviewed, CT scan images personally reviewed  Assessment  Left rectus sheath hematoma, resolving Plan   Reassured patient that this should resolve without any further issues.  She was told to return should she have any issues in the next few months.  I will see her for follow-up in a few months as needed.

## 2019-08-17 ENCOUNTER — Encounter (HOSPITAL_COMMUNITY): Payer: Self-pay | Admitting: Psychiatry

## 2019-08-17 ENCOUNTER — Other Ambulatory Visit: Payer: Self-pay

## 2019-08-17 ENCOUNTER — Ambulatory Visit (INDEPENDENT_AMBULATORY_CARE_PROVIDER_SITE_OTHER): Payer: Medicare Other | Admitting: Psychiatry

## 2019-08-17 DIAGNOSIS — F333 Major depressive disorder, recurrent, severe with psychotic symptoms: Secondary | ICD-10-CM

## 2019-08-17 MED ORDER — FLUOXETINE HCL 40 MG PO CAPS: 40.0000 mg | ORAL_CAPSULE | Freq: Every day | ORAL | 2 refills | Status: DC

## 2019-08-17 MED ORDER — GABAPENTIN 300 MG PO CAPS: 300.0000 mg | ORAL_CAPSULE | Freq: Two times a day (BID) | ORAL | 2 refills | Status: DC

## 2019-08-17 MED ORDER — BUPROPION HCL ER (XL) 300 MG PO TB24: 300.0000 mg | ORAL_TABLET | ORAL | 2 refills | Status: DC

## 2019-08-17 MED ORDER — CLONAZEPAM 1 MG PO TABS: 1.0000 mg | ORAL_TABLET | Freq: Every day | ORAL | 5 refills | Status: DC

## 2019-08-17 NOTE — Progress Notes (Signed)
Virtual Visit via Telephone Note  I connected with [name] on 08/17/19 at 11:00 AM EDT by telephone and verified that I am speaking with the correct person using two identifiers.   I discussed the limitations, risks, security and privacy concerns of performing an evaluation and management service by telephone and the availability of in person appointments. I also discussed with the patient that there may be a patient responsible charge related to this service. The patient expressed understanding and agreed to proceed    I discussed the assessment and treatment plan with the patient. The patient was provided an opportunity to ask questions and all were answered. The patient agreed with the plan and demonstrated an understanding of the instructions.   The patient was advised to call back or seek an in-person evaluation if the symptoms worsen or if the condition fails to improve as anticipated.  I provided 15 minutes of non-face-to-face time during this encounter. Location: Provider office, patient home  Diannia Ruder, MD  [facility] MD/PA/NP OP Progress Note  08/17/2019 11:11 AM Rebecca [name]  MRN:  [id number]  Chief Complaint:  Chief Complaint    Depression; Anxiety; Follow-up     HPI: Patient is a 64year-old separated white female who lives alone in [place]. 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 She has one daughter and 2 grandchildren  The patient was [facility] by [facility] where she was hospitalized July 31 through September 08, 2013 after an intentional overdose on Ambien and oxycodone. The patient has had one prior overdose attempt in 2004. This occurred after she found that her first husband had had an affair with her sister years prior.  After the hospital patient the patient was followed for time by [facility] in [place]. She did fairly well on Paxil CR. Most recently she has  been treated [facility] primary care and Abilify was added. Over the last year however she has lost many important people in her life. 2 of her aunt her best friend another friend and her dog all died within a short period of time. She was going downhill and getting more and more depressed and was dealing with that by drinking 2-3 ounces of liquor per day. She got increasingly despondent and eventually decided to take her life and wrote a suicide note to her family. Reportedly she was found the next morning and brought to the ER in the ICU and consequently to the behavioral health hospital.  While there are Wellbutrin and trazodone were added. The patient wasn't sleeping well prior to hospitalization but she sleeping much better now. Her mood is improved but her energy and motivation are still poor and she's developed tremor in both hands predicted on the left. 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 It's hard for her to type because of the tremor and she can't remember anything. She's no longer suicidal but just doesn't have much energy to do much of anything right  The patient returns for follow-up after 6 months.  In looking at her records it looks like she took a fall in May and had a subsequent abdominal hematoma.  It is taking a long time to resolve but she has been followed by Dr. Lovell Sheehan in general surgery.  It is taking a long time to resolve that she  has been followed by Dr. Lovell SheehanJenkins in general surgery.  It is no longer as painful as it had been in the beginning.  She states that when it initially happened she called her daughter to come to be with her in the emergency room and the daughter never called her back.  Eventually her parents came to help her as well as 2 nieces.  She realizes for sure that her daughter is not going to be responsive to her.  This made her depressed for a  little while but she seems to gotten over it.  She states that her mood is good she is sleeping well her energy is good and she is staying active.  She has gotten her coronavirus vaccines Visit Diagnosis:    ICD-10-CM   1. Severe recurrent major depressive disorder with psychotic features (HCC)  F33.3     Past Psychiatric History: 2 prior psychiatric hospitalizations for depression in 2004 and 2015  Past Medical History:  Past Medical History:  Diagnosis Date  . Asthma   . Chronic pain began in 2011 after jooint replacements  . Depression   . Reflux     Past Surgical History:  Procedure Laterality Date  . ABDOMINAL HYSTERECTOMY    . BREAST CYST EXCISION Left   . FOOT SURGERY  2011 -both feet  . KNEE SURGERY     left knee  . SHOULDER SURGERY  2011    Family Psychiatric History: see below  Family History:  Family History  Problem Relation Age of Onset  . Bipolar disorder Sister   . Alcohol abuse Maternal Grandfather   . Breast cancer Maternal Aunt   . Breast cancer Maternal Grandmother   . Breast cancer Cousin   . Deep vein thrombosis Neg Hx   . Pulmonary embolism Neg Hx   . Heart disease Neg Hx   . Heart failure Neg Hx     Social History:  Social History   Socioeconomic History  . Marital status: Divorced    Spouse name: Not on file  . Number of children: Not on file  . Years of education: Not on file  . Highest education level: Not on file  Occupational History  . Not on file  Tobacco Use  . Smoking status: Former Smoker    Types: Cigarettes    Quit date: 09/19/2009    Years since quitting: 9.9  . Smokeless tobacco: Never Used  Substance and Sexual Activity  . Alcohol use: No    Alcohol/week: 2.0 standard drinks    Types: 2 Shots of liquor per week  . Drug use: No  . Sexual activity: Yes  Other Topics Concern  . Not on file  Social History Narrative  . Not on file   Social Determinants of Health   Financial Resource Strain:   . Difficulty of  Paying Living Expenses:   Food Insecurity:   . Worried About Programme researcher, broadcasting/film/videounning Out of Food in the Last Year:   . Baristaan Out of Food in the Last Year:   Transportation Needs:   . Freight forwarderLack of Transportation (Medical):   Marland Kitchen. Lack of Transportation (Non-Medical):   Physical Activity:   . Days of Exercise per Week:   . Minutes of Exercise per Session:   Stress:   . Feeling of Stress :   Social Connections:   . Frequency of Communication with Friends and Family:   . Frequency of Social Gatherings with Friends and Family:   . Attends Religious Services:   .  Active Member of Clubs or Organizations:   . Attends Banker Meetings:   Marland Kitchen Marital Status:     Allergies:  Allergies  Allergen Reactions  . Sulfa Antibiotics Other (See Comments)    unknown    Metabolic Disorder Labs: No results found for: HGBA1C, MPG No results found for: PROLACTIN No results found for: CHOL, TRIG, HDL, CHOLHDL, VLDL, LDLCALC Lab Results  Component Value Date   TSH 2.310 09/06/2013    Therapeutic Level Labs: No results found for: LITHIUM No results found for: VALPROATE No components found for:  CBMZ  Current Medications: Current Outpatient Medications  Medication Sig Dispense Refill  . albuterol (PROVENTIL HFA;VENTOLIN HFA) 108 (90 BASE) MCG/ACT inhaler Inhale 2 puffs into the lungs every 4 (four) hours as needed for wheezing or shortness of breath.    Marland Kitchen albuterol (PROVENTIL) (2.5 MG/3ML) 0.083% nebulizer solution Take 2.5 mg by nebulization every 4 (four) hours as needed for wheezing or shortness of breath.    Marland Kitchen buPROPion (WELLBUTRIN XL) 300 MG 24 hr tablet Take 1 tablet (300 mg total) by mouth every morning. 90 tablet 2  . clonazePAM (KLONOPIN) 1 MG tablet Take 1 tablet (1 mg total) by mouth at bedtime. 30 tablet 5  . FLUoxetine (PROZAC) 40 MG capsule Take 1 capsule (40 mg total) by mouth daily. 90 capsule 2  . gabapentin (NEURONTIN) 300 MG capsule Take 1 capsule (300 mg total) by mouth 2 (two) times daily.  For agitation/pain managment 180 capsule 2  . pantoprazole (PROTONIX) 40 MG tablet Take 1 tablet (40 mg total) by mouth daily. For acid reflux    . rOPINIRole (REQUIP) 0.5 MG tablet Take 1 tablet (0.5 mg total) by mouth at bedtime. For restless leg syndrome    . SYMBICORT 160-4.5 MCG/ACT inhaler Inhale 2 puffs into the lungs 2 (two) times daily.     No current facility-administered medications for this visit.     Musculoskeletal: Strength & Muscle Tone: within normal limits Gait & Station: normal Patient leans: N/A  Psychiatric Specialty Exam: Review of Systems  Gastrointestinal: Positive for abdominal pain.  All other systems reviewed and are negative.   There were no vitals taken for this visit.There is no height or weight on file to calculate BMI.  General Appearance: NA  Eye Contact:  NA  Speech:  Clear and Coherent  Volume:  Normal  Mood:  Euthymic  Affect:  NA  Thought Process:  Goal Directed  Orientation:  Full (Time, Place, and Person)  Thought Content: WDL   Suicidal Thoughts:  No  Homicidal Thoughts:  No  Memory:  Immediate;   Good Recent;   Good Remote;   Good  Judgement:  Good  Insight:  Good  Psychomotor Activity:  Normal  Concentration:  Concentration: Good and Attention Span: Good  Recall:  Good  Fund of Knowledge: Good  Language: Good  Akathisia:  No  Handed:  Right  AIMS (if indicated): not done  Assets:  Communication Skills Desire for Improvement Physical Health Resilience Social Support Talents/Skills  ADL's:  Intact  Cognition: WNL  Sleep:  Good   Screenings: AUDIT     [facility]  Alcohol Use Disorder Identification Test Final Score (AUDIT) 40       Assessment and Plan: This patient is a 64 year old female with a history of depression.  She continues to do well on her current regimen.  She will continue clonazepam 1 mg at  bedtime for sleep and anxiety,  gabapentin 300 mg twice daily for anxiety, Wellbutrin XL 300 mg as well as Prozac 40 mg daily for depression.  She will return to see me in 6 months or call sooner as needed   Diannia Ruder, MD 08/17/2019, 11:11 AM

## 2019-10-11 ENCOUNTER — Ambulatory Visit (HOSPITAL_COMMUNITY)
Admission: RE | Admit: 2019-10-11 | Discharge: 2019-10-11 | Disposition: A | Discharge status: Home / Self Care | Payer: Medicare Other | Source: Ambulatory Visit | Attending: Internal Medicine | Admitting: Internal Medicine

## 2019-10-11 ENCOUNTER — Encounter: Payer: Self-pay | Admitting: Internal Medicine

## 2019-10-11 ENCOUNTER — Other Ambulatory Visit (HOSPITAL_COMMUNITY)
Admission: RE | Admit: 2019-10-11 | Discharge: 2019-10-11 | Disposition: A | Discharge status: Home / Self Care | Payer: Medicare Other | Source: Ambulatory Visit | Attending: Internal Medicine | Admitting: Internal Medicine

## 2019-10-11 ENCOUNTER — Other Ambulatory Visit: Payer: Self-pay

## 2019-10-11 ENCOUNTER — Ambulatory Visit (INDEPENDENT_AMBULATORY_CARE_PROVIDER_SITE_OTHER): Payer: Medicare Other | Admitting: Internal Medicine

## 2019-10-11 DIAGNOSIS — R0609 Other forms of dyspnea: Secondary | ICD-10-CM

## 2019-10-11 DIAGNOSIS — R0600 Dyspnea, unspecified: Secondary | ICD-10-CM | POA: Insufficient documentation

## 2019-10-11 LAB — CBC WITH DIFFERENTIAL/PLATELET
Abs Immature Granulocytes: 0.05 10*3/uL (ref 0.00–0.07)
Basophils Absolute: 0.1 10*3/uL (ref 0.0–0.1)
Basophils Relative: 1 %
Eosinophils Absolute: 0.5 10*3/uL (ref 0.0–0.5)
Eosinophils Relative: 4 %
HCT: 41.7 % (ref 36.0–46.0)
Hemoglobin: 13.1 g/dL (ref 12.0–15.0)
Immature Granulocytes: 1 %
Lymphocytes Relative: 21 %
Lymphs Abs: 2.2 10*3/uL (ref 0.7–4.0)
MCH: 27.9 pg (ref 26.0–34.0)
MCHC: 31.4 g/dL (ref 30.0–36.0)
MCV: 88.9 fL (ref 80.0–100.0)
Monocytes Absolute: 0.7 10*3/uL (ref 0.1–1.0)
Monocytes Relative: 6 %
Neutro Abs: 7 10*3/uL (ref 1.7–7.7)
Neutrophils Relative %: 67 %
Platelets: 411 10*3/uL — ABNORMAL HIGH (ref 150–400)
RBC: 4.69 MIL/uL (ref 3.87–5.11)
RDW: 16.5 % — ABNORMAL HIGH (ref 11.5–15.5)
WBC: 10.5 10*3/uL (ref 4.0–10.5)
nRBC: 0 % (ref 0.0–0.2)

## 2019-10-11 LAB — BASIC METABOLIC PANEL
Anion gap: 8 (ref 5–15)
BUN: 19 mg/dL (ref 8–23)
CO2: 27 mmol/L (ref 22–32)
Calcium: 8.9 mg/dL (ref 8.9–10.3)
Chloride: 103 mmol/L (ref 98–111)
Creatinine, Ser: 0.89 mg/dL (ref 0.44–1.00)
GFR calc Af Amer: >60 mL/min (ref >60)
GFR calc non Af Amer: >60 mL/min (ref >60)
Glucose, Bld: 104 mg/dL — ABNORMAL HIGH (ref 70–99)
Potassium: 4.4 mmol/L (ref 3.5–5.1)
Sodium: 138 mmol/L (ref 135–145)

## 2019-10-11 LAB — TSH: TSH: 2.286 u[IU]/mL (ref 0.350–4.500)

## 2019-10-11 LAB — D-DIMER, QUANTITATIVE: D-Dimer, Quant: 0.75 ug/mL-FEU — ABNORMAL HIGH (ref 0.00–0.50)

## 2019-10-11 LAB — BRAIN NATRIURETIC PEPTIDE: B Natriuretic Peptide: 41 pg/mL (ref 0.0–100.0)

## 2019-10-11 MED ORDER — BREZTRI AEROSPHERE 160-9-4.8 MCG/ACT IN AERO: 2.0000 | INHALATION_SPRAY | Freq: Two times a day (BID) | RESPIRATORY_TRACT | 11 refills | Status: DC

## 2019-10-11 NOTE — Assessment & Plan Note (Addendum)
Wt around 242 baseline summer 07/2017   Body mass index is 46.59 kg/m.  = 263 lbs  Lab Results  Component Value Date   TSH 2.286 10/11/2019     Contributing to doe plus gerd risk reviewed the need and the process to achieve and maintain neg calorie balance > defer f/u primary care including intermittently monitoring thyroid status            Each maintenance medication was reviewed in detail including emphasizing most importantly the difference between maintenance and prns and under what circumstances the prns are to be triggered using an action plan format where appropriate.  Total time for H and P, chart review, counseling, teaching device and generating customized AVS unique to this office visit / charting = 70 min

## 2019-10-11 NOTE — Patient Instructions (Signed)
Plan A = Automatic = Always=    Breztri Take 2 puffs first thing in am and then another 2 puffs about 12 hours later.      Plan B = Backup (to supplement plan A, not to replace it) Only use your albuterol inhaler as a rescue medication to be used if you can't catch your breath by resting or doing a relaxed purse lip breathing pattern.  - The less you use it, the better it will work when you need it. - Ok to use the inhaler up to 2 puffs  every 4 hours if you must but call for appointment if use goes up over your usual need - Don't leave home without it !!  (think of it like the spare tire for your car)   Plan C = Crisis (instead of Plan B but only if Plan B stops working) - only use your albuterol nebulizer if you first try Plan B and it fails to help > ok to use the nebulizer up to every 4 hours but if start needing it regularly call for immediate appointment   Also:  Try albuterol 15 min before an activity that you know would make you short of breath and see if it makes any difference and if makes none then don't take it after activity unless you can't catch your breath.      Try to quit smoking if at all possible  Please remember to go to the lab and x-ray department at [facility]   for your tests - we will call you with the results when they are available.      Please schedule a follow up office visit in 4-6 weeks with PFTs first available,   Call sooner if needed

## 2019-10-11 NOTE — Assessment & Plan Note (Addendum)
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 with DDX of  difficult airways management almost all start with A and  include Adherence, Ace Inhibitors, Acid Reflux, Active Sinus Disease, Alpha 1 Antitripsin deficiency, Anxiety masquerading as Airways dz,  ABPA,  Allergy(esp in young), Aspiration (esp in elderly), Adverse effects of meds,  Active smoking or vaping, A bunch of PE's (a small clot burden can't cause this syndrome unless there is already severe underlying pulm or vascular dz with poor reserve) plus two Bs  = Bronchiectasis and Beta blocker use..and one C= CHF   Adherence is always the initial "prime suspect" and is a multilayered concern that requires a "trust but verify" approach in every patient - starting with knowing how to use medications, especially inhalers, correctly, keeping up with refills and understanding the fundamental difference between maintenance and prns vs those medications only taken for a very short course and then stopped and not refilled.  - see hfa teaching - return with all meds in hand using a trust but verify approach to confirm accurate Medication  Reconciliation The principal here is that until we are certain that the  patients are doing what we've asked, it makes no sense to ask them to do more.   Active smoker > obviously also near to top the usual list of suspects   ? Acid (or non-acid) GERD > always difficult to exclude as up to 75% of pts in some series report no assoc GI/ Heartburn symptoms> rec continue h)  acid suppression    ? Allergy /asthma > high dose ICS for now pending pfts - - The proper method of use, as well as anticipated side effects, of a metered-dose inhaler are discussed and demonstrated to the patient.  Re saba: I spent extra time with pt today reviewing appropriate use of  albuterol for prn use on exertion with the following points: 1) saba is for relief of sob that does not improve by walking a slower pace or resting but rather if the pt does not improve after trying this first. 2) If the pt is convinced, as many are, that saba helps recover from activity faster then it's easy to tell if this is the case by re-challenging : ie stop, take the inhaler, then p 5 minutes try the exact same activity (intensity of workload) that just caused the symptoms and see if they are substantially diminished or not after saba 3) if there is an activity that reproducibly causes the symptoms, try the saba 15 min before the activity on alternate days   If in fact the saba really does help, then fine to continue to use it prn but advised may need to look closer at the maintenance regimen being used to achieve better control of airways disease with exertion.   ? Anxiety/depresssion with wt gain/ deconditioning  > usually at the bottom of this list of usual suspects but   note already on psychotropics and may interfere with adherence and ability to quit smoking  also interpretation of response or lack thereof to symptom management which can be quite subjective.   ? Anemia / thyroid dz > ruled out today  ? A bunch of PE's, at risk due to wt / chronic L leg swelling:  D dimer  high normal value (seen commonly in the elderly or chronically ill)  may miss small peripheral pe, the clot burden with sob is moderately high and the d dimer  has a very high neg pred value if used in this setting.    ? chf component > cxr and low bnp argue against   >>> f/u with pft in next 6-8 weeks

## 2019-10-11 NOTE — Progress Notes (Signed)
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 Silvestre [name].  Pt c/o cough with clear sputum in the am's and wheezing x 6 months. She also c/o SOB "just about all the time"- worse with exertion.   Dyspnea:  [facility] parking due to arthritis back and L knee one aisle at grocery  Cough: more in am's > thick white  Sleep: bed is flat/ R side  SABA use: ?  Improves p saba - never rechallenges  No obvious day to day or daytime variability or assoc excess/ purulent sputum or mucus plugs or hemoptysis or cp or chest tightness, subjective wheeze or overt sinus or hb symptoms.   Sleeping  without nocturnal  or early am exacerbation  of respiratory  c/o's or need for noct saba. Also denies any obvious fluctuation of symptoms with weather or environmental changes or other aggravating or alleviating factors except as outlined above   No unusual exposure hx or h/o childhood pna/ asthma or knowledge of premature birth.  Current Allergies, Complete Past Medical History, Past Surgical History, Family History, and Social History were reviewed in [employer] record.  ROS  The following are not active complaints unless bolded Hoarseness, sore throat, dysphagia, dental problems, itching, sneezing,  nasal congestion or discharge of excess mucus or purulent secretions, ear ache,   fever, chills, sweats, unintended wt loss or wt gain, classically pleuritic or  exertional cp,  orthopnea pnd or arm/hand swelling  or leg swelling L>R chronically , presyncope, palpitations, abdominal pain, anorexia, nausea, vomiting, diarrhea  or change in bowel habits or change in bladder habits, change in stools or change in urine, dysuria, hematuria,  rash, arthralgias, visual complaints, headache, numbness, weakness or ataxia or problems with walking or coordination,  change in mood or  memory.           Past Medical History:  Diagnosis Date   Asthma    Chronic pain began in 2011 after jooint replacements   Depression    Reflux     Outpatient Medications Prior to Visit  Medication Sig Dispense Refill   albuterol (PROVENTIL HFA;VENTOLIN HFA) 108 (90 BASE) MCG/ACT inhaler Inhale 2 puffs into the lungs every 4 (four) hours as needed for wheezing or shortness of breath.     albuterol (PROVENTIL) (2.5 MG/3ML) 0.083% nebulizer solution Take 2.5 mg by nebulization every 4 (four) hours as needed for wheezing or shortness of breath.     buPROPion (WELLBUTRIN XL) 300 MG 24 hr tablet Take 1 tablet (300 mg total) by mouth every morning. 90 tablet 2   CALCIUM PO Take 1 tablet by mouth daily.     clonazePAM (KLONOPIN) 1 MG tablet Take 1 tablet (1 mg total) by mouth at bedtime. 30 tablet 5   FLUoxetine (PROZAC) 40 MG capsule Take 1 capsule (40 mg total) by mouth daily. 90 capsule 2  gabapentin (NEURONTIN) 300 MG capsule Take 1 capsule (300 mg total) by mouth 2 (two) times daily. For agitation/pain managment 180 capsule 2   ibuprofen (ADVIL) 600 MG tablet Take 600 mg by mouth in the morning and at bedtime.     pantoprazole (PROTONIX) 40 MG tablet Take 1 tablet (40 mg total) by mouth daily. For acid reflux     rOPINIRole (REQUIP) 0.5 MG tablet Take 1 tablet (0.5 mg total) by mouth at bedtime. For restless leg syndrome     SYMBICORT 160-4.5 MCG/ACT inhaler Inhale 2 puffs into the lungs 2 (two) times daily.     No facility-administered medications prior to visit.      Objective:     BP 114/86 (BP Location: Left Arm, Cuff Size: Large)    Pulse 70    Temp (!) 97.1 F (36.2 C) (Temporal)    Ht 5\' 3"  (1.6 m)    Wt 263 lb (119.3 kg)    SpO2 96% Comment: on RA   BMI 46.59 kg/m   SpO2: 96 % (on RA)  Obese very pleasant amb wf nad   HEENT : pt wearing mask not removed for exam due to covid - 19 concerns.    NECK :  without JVD/Nodes/TM/ nl carotid upstrokes bilaterally   LUNGS: no acc muscle use,  Mild barrel  contour chest wall with bilateral  Distant bs c trace scattered  wheeze and  without cough on insp or exp maneuvers  and mild  Hyperresonant  to  percussion bilaterally     CV:  RRR  no s3 or murmur or increase in P2, and L > R ankle pitting edema   ABD:  Obese soft and nontender with pos end  insp Hoover's  in the supine position. No bruits or organomegaly appreciated, bowel sounds nl  MS:   Nl gait/  ext warm without deformities, calf tenderness, cyanosis or clubbing No obvious joint restrictions   SKIN: warm and dry without lesions    NEURO:  alert, approp, nl sensorium with  no motor or cerebellar deficits apparent.        CXR PA and Lateral:   10/11/2019 :    I personally reviewed images and  y impression as follows:  Reduced lung vol, min CM / no acute changes   Labs ordered/ reviewed:      Chemistry      Component Value Date/Time   NA 138 10/11/2019 1034   K 4.4 10/11/2019 1034   CL 103 10/11/2019 1034   CO2 27 10/11/2019 1034   BUN 19 10/11/2019 1034   CREATININE 0.89 10/11/2019 1034      Component Value Date/Time   CALCIUM 8.9 10/11/2019 1034   ALKPHOS 97 06/26/2019 1120   AST 18 06/26/2019 1120   ALT 16 06/26/2019 1120   BILITOT 0.3 06/26/2019 1120        Lab Results  Component Value Date   WBC 10.5 10/11/2019   HGB 13.1 10/11/2019   HCT 41.7 10/11/2019   MCV 88.9 10/11/2019   PLT 411 (H) 10/11/2019       EOS                                                               0.5  10/11/2019   Lab Results  Component Value Date   DDIMER 0.75 (H) 10/11/2019      Lab Results  Component Value Date   TSH 2.286 10/11/2019      BNP    10/11/2019     = 41               Assessment   DOE (dyspnea on exertion) 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 with DDX of  difficult airways management almost all start with A and  include Adherence, Ace Inhibitors, Acid Reflux, Active Sinus Disease, Alpha 1 Antitripsin deficiency, Anxiety masquerading as Airways dz,  ABPA,  Allergy(esp in young), Aspiration (esp in elderly), Adverse effects of meds,  Active smoking or vaping, A bunch of PE's (a small clot burden can't cause this syndrome unless there is already severe underlying pulm or vascular dz with poor reserve) plus two Bs  = Bronchiectasis and Beta blocker use..and one C= CHF   Adherence is always the initial "prime suspect" and is a multilayered concern that requires a "trust but verify" approach in every patient - starting with knowing how to use medications, especially inhalers, correctly, keeping up with refills and understanding the fundamental difference between maintenance and prns vs those medications only taken for a very short course and then stopped and not refilled.  - see hfa teaching - return with all meds in hand using a trust but verify approach to confirm accurate Medication  Reconciliation The principal here is that until we are certain that the  patients are doing what we've asked, it makes no sense to ask them to do more.   Active smoker > obviously also near to top the usual list of suspects   ? Acid (or non-acid) GERD > always difficult to exclude as up to 75% of pts in some series report no assoc GI/ Heartburn symptoms> rec continue h)  acid suppression    ? Allergy /asthma >  high dose ICS for now pending pfts - - The proper method of use, as well as anticipated side effects, of a metered-dose inhaler are discussed and demonstrated to the patient.  Re saba: I spent extra time with pt today reviewing appropriate use of albuterol for prn use on exertion with the following points: 1) saba is for relief of sob that does not improve by walking a slower pace or resting but rather if the pt does not improve after trying this first. 2) If the pt is convinced, as many are, that saba helps recover from activity faster then it's easy to tell if this is the case by re-challenging : ie stop, take the inhaler, then p 5 minutes try the exact same activity (intensity of workload) that just caused the symptoms and see if they are substantially diminished or not after saba 3) if there is an activity that reproducibly causes the symptoms, try the saba 15 min before the activity on alternate days   If in fact the saba really does help, then fine to continue to use it prn but advised may need to look closer at the maintenance regimen being used to achieve better control of airways disease with exertion.   ? Anxiety/depresssion with wt gain/ deconditioning  > usually at the bottom of this list  of usual suspects but   note already on psychotropics and may interfere with adherence and ability to quit smoking  also interpretation of response or lack thereof to symptom management which can be quite subjective.   ? Anemia / thyroid dz > ruled out today  ? A bunch of PE's, at risk due to wt / chronic L leg swelling:  D dimer   high normal value (seen commonly in the elderly or chronically ill)  may miss small peripheral pe, the clot burden with sob is moderately high and the d dimer  has a very high neg pred value if used in this setting.    ? chf component > cxr and low bnp argue against   >>> f/u with pft in next 6-8 weeks     Morbid obesity (HCC) Wt around 242 baseline summer 07/2017    Body mass index is 46.59 kg/m.  = 263 lbs  Lab Results  Component Value Date   TSH 2.286 10/11/2019     Contributing to doe plus gerd risk reviewed the need and the process to achieve and maintain neg calorie balance > defer f/u primary care including intermittently monitoring thyroid status            Each maintenance medication was reviewed in detail including emphasizing most importantly the difference between maintenance and prns and under what circumstances the prns are to be triggered using an action plan format where appropriate.  Total time for H and P, chart review, counseling, teaching device and generating customized AVS unique to this office visit / charting = 70 min           Sandrea Hughs, MD 10/11/2019

## 2019-10-12 LAB — ALPHA-1 ANTITRYPSIN PHENOTYPE: A-1 Antitrypsin, Ser: 158 mg/dL (ref 101–187)

## 2019-10-12 NOTE — Progress Notes (Signed)
Called and spoke with patient about xray result per Dr Wert. All questions answered and patient expressed full understanding. Nothing further needed at this time.

## 2019-10-13 ENCOUNTER — Telehealth: Payer: Self-pay | Admitting: Internal Medicine

## 2019-10-13 LAB — IGE: IgE (Immunoglobulin E), Serum: 121 IU/mL (ref 6–495)

## 2019-10-15 NOTE — Progress Notes (Signed)
Pt seen results and notes on mychart- see phone note

## 2019-11-04 ENCOUNTER — Other Ambulatory Visit (HOSPITAL_COMMUNITY)
Admission: RE | Admit: 2019-11-04 | Discharge: 2019-11-04 | Disposition: A | Discharge status: Home / Self Care | Payer: Medicare Other | Source: Ambulatory Visit | Attending: Internal Medicine | Admitting: Internal Medicine

## 2019-11-04 ENCOUNTER — Other Ambulatory Visit: Payer: Self-pay

## 2019-11-04 DIAGNOSIS — Z01812 Encounter for preprocedural laboratory examination: Secondary | ICD-10-CM | POA: Diagnosis present

## 2019-11-04 DIAGNOSIS — Z20822 Contact with and (suspected) exposure to covid-19: Secondary | ICD-10-CM | POA: Diagnosis not present

## 2019-11-04 LAB — SARS CORONAVIRUS 2 (TAT 6-24 HRS): SARS Coronavirus 2: NEGATIVE

## 2019-11-07 ENCOUNTER — Other Ambulatory Visit: Payer: Self-pay

## 2019-11-07 ENCOUNTER — Ambulatory Visit (HOSPITAL_COMMUNITY)
Admission: RE | Admit: 2019-11-07 | Discharge: 2019-11-07 | Disposition: A | Discharge status: Home / Self Care | Payer: Medicare Other | Source: Ambulatory Visit | Attending: Internal Medicine | Admitting: Internal Medicine

## 2019-11-07 DIAGNOSIS — R0600 Dyspnea, unspecified: Secondary | ICD-10-CM | POA: Insufficient documentation

## 2019-11-07 DIAGNOSIS — R0609 Other forms of dyspnea: Secondary | ICD-10-CM

## 2019-11-07 LAB — PULMONARY FUNCTION TEST
DL/VA % pred: 127 %
DL/VA: 5.35 ml/min/mmHg/L
DLCO unc % pred: 94 %
DLCO unc: 18.84 ml/min/mmHg
FEF 25-75 Post: 2.05 L/sec
FEF 25-75 Pre: 0.85 L/sec
FEF2575-%Change-Post: 142 %
FEF2575-%Pred-Post: 92 %
FEF2575-%Pred-Pre: 38 %
FEV1-%Change-Post: 29 %
FEV1-%Pred-Post: 68 %
FEV1-%Pred-Pre: 52 %
FEV1-Post: 1.69 L
FEV1-Pre: 1.31 L
FEV1FVC-%Change-Post: 10 %
FEV1FVC-%Pred-Pre: 91 %
FEV6-%Change-Post: 16 %
FEV6-%Pred-Post: 69 %
FEV6-%Pred-Pre: 59 %
FEV6-Post: 2.15 L
FEV6-Pre: 1.84 L
FEV6FVC-%Pred-Post: 103 %
FEV6FVC-%Pred-Pre: 103 %
FVC-%Change-Post: 16 %
FVC-%Pred-Post: 66 %
FVC-%Pred-Pre: 57 %
FVC-Post: 2.15 L
FVC-Pre: 1.84 L
Post FEV1/FVC ratio: 79 %
Post FEV6/FVC ratio: 100 %
Pre FEV1/FVC ratio: 71 %
Pre FEV6/FVC Ratio: 100 %
RV % pred: 103 %
RV: 2.11 L
TLC % pred: 85 %
TLC: 4.33 L

## 2019-11-07 MED ORDER — ALBUTEROL SULFATE (2.5 MG/3ML) 0.083% IN NEBU
2.5000 mg | INHALATION_SOLUTION | Freq: Once | RESPIRATORY_TRACT | Status: AC
  Administered 2019-11-07: 2.5 mg via RESPIRATORY_TRACT

## 2019-11-08 NOTE — Progress Notes (Signed)
Spoke with pt and notified of results per Dr. Wert. Pt verbalized understanding and denied any questions. 

## 2019-11-22 ENCOUNTER — Telehealth (HOSPITAL_COMMUNITY): Payer: Self-pay | Admitting: Psychiatry

## 2019-11-22 NOTE — Telephone Encounter (Signed)
Called to schedule f/u appt, left vm 

## 2019-11-24 ENCOUNTER — Ambulatory Visit: Payer: Medicare Other | Admitting: Internal Medicine

## 2019-11-24 ENCOUNTER — Encounter: Payer: Self-pay | Admitting: Internal Medicine

## 2019-11-24 ENCOUNTER — Other Ambulatory Visit: Payer: Self-pay

## 2019-11-24 DIAGNOSIS — R0609 Other forms of dyspnea: Secondary | ICD-10-CM

## 2019-11-24 DIAGNOSIS — J4530 Mild persistent asthma, uncomplicated: Secondary | ICD-10-CM | POA: Diagnosis not present

## 2019-11-24 DIAGNOSIS — R0600 Dyspnea, unspecified: Secondary | ICD-10-CM | POA: Diagnosis not present

## 2019-11-24 DIAGNOSIS — F17210 Nicotine dependence, cigarettes, uncomplicated: Secondary | ICD-10-CM | POA: Diagnosis not present

## 2019-11-24 NOTE — Assessment & Plan Note (Signed)
Referred for lung ca screening 11/24/2019   Counseled re importance of smoking cessation but did not meet time criteria for separate billing

## 2019-11-24 NOTE — Assessment & Plan Note (Signed)
All goals of chronic asthma control met including optimal function and elimination of symptoms with minimal need for rescue therapy.  Contingencies discussed in full including contacting this office immediately if not controlling the symptoms using the rule of two's.      >>> f/u yearly advised, call sooner prn

## 2019-11-24 NOTE — Patient Instructions (Addendum)
The key is to stop smoking completely before smoking completely stops you!  We will schedule you a televist with Kandice Robinsons NP who will talk to you about the lung cancer screening program  Weight control is simply a matter of calorie balance which needs to be tilted in your favor by eating less and exercising more.  To get the most out of exercise, you need to be continuously aware that you are short of breath, but never out of breath, for 30 minutes daily. As you improve, it will actually be easier for you to do the same amount of exercise  in  30 minutes so always push to the level where you are short of breath.  If this does not result in gradual weight reduction then I strongly recommend you see a nutritionist with a food diary x 2 weeks so that we can work out a negative calorie balance which is universally effective in steady weight loss programs.  Think of your calorie balance like you do your bank account where in this case you want the balance to go down so you must take in less calories than you burn up.  It's just that simple:  Hard to do, but easy to understand.  Good luck!    Please schedule a follow up visit in 12 months but call sooner if needed

## 2019-11-24 NOTE — Assessment & Plan Note (Signed)
Active smoker - 10/11/2019  After extensive coaching inhaler device,  effectiveness =    75% (short Ti)  > try breztri 2bid  - Allergy profile 10/11/2019 >  Eos 0.5 /  IgE  121 > 10/13/2019 rec add singulair  - alpha one AT  10/11/19   MM   158  - PFT's  11/07/2019    FEV1 1.69 (68 % ) ratio 0.79  p 29 % improvement from saba p 0 prior to study with DLCO  19.99 (94%) corrects to 4.23 (126%)  for alv volume and FV curve min concave  And ERV 22%    Much better with rx of AB with breztri 2bid > no change rx

## 2019-11-24 NOTE — Assessment & Plan Note (Addendum)
ERV 22% by PFTs  11/07/19   Body mass index is 46.23 kg/m.  -  trending down slightly , encouraged Lab Results  Component Value Date   TSH 2.286 10/11/2019     Contributing to gerd risk/ doe/reviewed the need and the process to achieve and maintain neg calorie balance > defer f/u primary care including intermittently monitoring thyroid status          Each maintenance medication was reviewed in detail including emphasizing most importantly the difference between maintenance and prns and under what circumstances the prns are to be triggered using an action plan format where appropriate.  Total time for H and P, chart review, counseling, reviewing hfa device and generating customized AVS unique to this office visit / charting  = 35 min

## 2019-11-24 NOTE — Progress Notes (Addendum)
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 Pulmonary Consult    Referred by Dr. Silvestre Moment.  Pt c/o cough with clear sputum in the am's and wheezing x 6 months. She also c/o SOB "just about all the time"- worse with exertion.   Dyspnea:  [facility] parking due to arthritis back and L knee one aisle at grocery  Cough: more in am's > thick white  Sleep: bed is flat/ R side  SABA use: ?  Improves p saba - never rechallenges rec Plan A = Automatic = Always=    Breztri Take 2 puffs first thing in am and then another 2 puffs about 12 hours later.  Plan B = Backup (to supplement plan A, not to replace it) Only use your albuterol inhaler as a rescue medication Plan C = Crisis (instead of Plan B but only if Plan B stops working) - only use your albuterol nebulizer if you first try Plan B  Also:  Try albuterol 15 min before an activity that you know would make you short of breath and see if it makes any difference and if makes none then don't take it after activity unless you can't catch your breath.  Try to quit smoking if at all possible    11/24/2019  f/u ov/[place] office/Rebecca [name] re: AB/still smoking with ERV 22% Chief Complaint  Patient presents with  . Follow-up    Breathing has improved since the last visit. She is using her albuterol inhaler inhaler 3 x per wk and has not used neb.    Dyspnea:  Climbing on yard equipment, mb and back easier  Cough: none  Sleeping: flat bed one pillow R  side fine  SABA use: much less 02: not    No obvious day to day or daytime variability or assoc excess/ purulent sputum or mucus plugs or hemoptysis or cp or chest tightness, subjective wheeze or overt sinus or hb symptoms.   Sleeping  without nocturnal  or early am exacerbation  of respiratory  c/o's or need for noct saba. Also denies any obvious fluctuation of symptoms with weather or environmental changes or other aggravating or alleviating factors except as outlined above   No unusual exposure hx or h/o childhood pna/ asthma or knowledge of premature birth.  Current Allergies, Complete Past Medical History, Past Surgical History, Family History, and Social History were reviewed in [employer] record.  ROS  The following are not active complaints unless bolded Hoarseness, sore throat, dysphagia, dental problems, itching, sneezing,  nasal congestion or discharge of excess mucus or purulent secretions, ear ache,   fever, chills, sweats, unintended wt loss or wt gain, classically pleuritic or exertional cp,  orthopnea pnd or arm/hand swelling  or leg swelling, presyncope, palpitations, abdominal pain, anorexia, nausea, vomiting, diarrhea  or change in bowel habits or change in bladder habits, change in stools or change in urine, dysuria, hematuria,  rash, arthralgias, visual complaints, headache, numbness, weakness or ataxia or problems with walking or coordination,  change in mood or  memory.        Current Meds  Medication Sig  . albuterol (PROVENTIL HFA;VENTOLIN HFA) 108 (90 BASE) MCG/ACT inhaler Inhale 2 puffs into the lungs every 4 (four) hours as needed for wheezing or shortness of breath.  Marland Kitchen albuterol (PROVENTIL) (2.5 MG/3ML) 0.083% nebulizer solution Take 2.5 mg by nebulization every 4 (four) hours as needed for wheezing or shortness of breath.  . Budeson-Glycopyrrol-Formoterol (BREZTRI AEROSPHERE) 160-9-4.8 MCG/ACT AERO Inhale 2 puffs into the lungs 2 (two) times  daily.  Marland Kitchen buPROPion (WELLBUTRIN XL) 300 MG 24 hr tablet Take 1 tablet (300 mg total) by mouth every morning.  Marland Kitchen CALCIUM PO Take 1 tablet by mouth daily.  . clonazePAM (KLONOPIN) 1 MG tablet Take 1 tablet (1 mg total) by mouth at bedtime.  Marland Kitchen FLUoxetine (PROZAC) 40 MG capsule Take 1 capsule (40 mg total) by mouth daily.  Marland Kitchen gabapentin (NEURONTIN) 300 MG capsule Take 1 capsule (300 mg total) by mouth 2 (two) times daily. For agitation/pain managment  . ibuprofen (ADVIL) 600 MG tablet Take 600 mg by mouth in the morning and at bedtime.  . pantoprazole (PROTONIX) 40 MG tablet Take 1 tablet (40 mg total) by mouth daily. For acid reflux  . rOPINIRole (REQUIP) 0.5 MG tablet Take 1 tablet (0.5 mg total) by mouth at bedtime. For restless leg syndrome           Objective:     Wt Readings from Last 3 Encounters:  11/24/19 261 lb (118.4 kg)  10/11/19 263 lb (119.3 kg)  07/05/19 259 lb (117.5 kg)     Vital signs reviewed - Note on arrival 11/24/2019  02 sats  95% on RA    HEENT : pt wearing mask not removed for exam due to covid -19 concerns.    NECK :  without JVD/Nodes/TM/ nl carotid upstrokes bilaterally   LUNGS: no acc muscle use,  Nl contour chest which is clear to A and P bilaterally without cough on insp or exp maneuvers   CV:  RRR  no s3 or murmur or increase in P2, and no edema   ABD:  Obese soft and nontender with nl inspiratory excursion in the supine position. No bruits or organomegaly appreciated, bowel sounds nl  MS:  Nl gait/ ext warm without deformities, calf tenderness, cyanosis or clubbing No obvious joint restrictions   SKIN: warm and dry without lesions    NEURO:  alert, approp, nl sensorium with  no motor or cerebellar deficits apparent.                  Assessment

## 2019-12-07 ENCOUNTER — Ambulatory Visit: Payer: Medicare Other | Admitting: Acute Care

## 2020-02-16 ENCOUNTER — Other Ambulatory Visit (HOSPITAL_COMMUNITY): Payer: Self-pay | Admitting: Psychiatry

## 2020-02-16 ENCOUNTER — Encounter (HOSPITAL_COMMUNITY): Payer: Self-pay | Admitting: Psychiatry

## 2020-02-16 ENCOUNTER — Other Ambulatory Visit: Payer: Self-pay

## 2020-02-16 ENCOUNTER — Telehealth (INDEPENDENT_AMBULATORY_CARE_PROVIDER_SITE_OTHER): Payer: Medicare Other | Admitting: Psychiatry

## 2020-02-16 DIAGNOSIS — F333 Major depressive disorder, recurrent, severe with psychotic symptoms: Secondary | ICD-10-CM

## 2020-02-16 MED ORDER — FLUOXETINE HCL 40 MG PO CAPS: 40.0000 mg | ORAL_CAPSULE | Freq: Every day | ORAL | 2 refills | Status: DC

## 2020-02-16 MED ORDER — CLONAZEPAM 1 MG PO TABS: 1.0000 mg | ORAL_TABLET | Freq: Every day | ORAL | 5 refills | Status: DC

## 2020-02-16 MED ORDER — BUPROPION HCL ER (XL) 300 MG PO TB24: 300.0000 mg | ORAL_TABLET | ORAL | 2 refills | Status: DC

## 2020-02-16 MED ORDER — GABAPENTIN 300 MG PO CAPS
300.0000 mg | ORAL_CAPSULE | Freq: Two times a day (BID) | ORAL | 2 refills | Status: DC
Start: 2020-02-16 — End: 2020-07-18

## 2020-02-16 MED ORDER — GABAPENTIN 300 MG PO CAPS
300.0000 mg | ORAL_CAPSULE | Freq: Two times a day (BID) | ORAL | 2 refills | Status: DC
Start: 2020-02-16 — End: 2020-02-16

## 2020-02-16 NOTE — Progress Notes (Signed)
Virtual Visit via Telephone Note  I connected with Rebecca [name] on 02/16/20 at  1:00 PM EST by telephone and verified that I am speaking with the correct person using two identifiers.  Location: Patient: home Provider:home   I discussed the limitations, risks, security and privacy concerns of performing an evaluation and management service by telephone and the availability of in person appointments. I also discussed with the patient that there may be a patient responsible charge related to this service. The patient expressed understanding and agreed to proceed    I discussed the assessment and treatment plan with the patient. The patient was provided an opportunity to ask questions and all were answered. The patient agreed with the plan and demonstrated an understanding of the instructions.   The patient was advised to call back or seek an in-person evaluation if the symptoms worsen or if the condition fails to improve as anticipated.  I provided 15 minutes of non-face-to-face time during this encounter.   Diannia Ruder, MD  [facility] MD/PA/NP OP Progress Note  02/16/2020 1:12 PM Rebecca [name]  MRN:  [id number]  Chief Complaint:  Chief Complaint    Depression; Anxiety; Follow-up     HPI: Patient is a 65year-old separated white female who lives alone in [place]. 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 She has one daughter and 2 grandchildren  The patient was referred by [facility] where she was hospitalized July 31 through August 6, 2015after an intentional overdose on Ambien and oxycodone. The patient has had one prior overdose attempt in 2004. This occurred after she found that her first husband had had an affair with her sister years prior.  After the hospital patient the patient was followed for time by [facility] in [place]. She did fairly well on Paxil CR. Most recently she has  been treated [facility] primary care and Abilify was added. Over the last year however she has lost many important people in her life. 2 of her aunt her best friend another friend and her dog all died within a short period of time. She was going downhill and getting more and more depressed and was dealing with that by drinking 2-3 ounces of liquor per day. She got increasingly despondent and eventually decided to take her life and wrote a suicide note to her family. Reportedly she was found the next morning and brought to the ER in the ICU and consequently to the behavioral health hospital.  While there are Wellbutrin and trazodone were added. The patient wasn't sleeping well prior to hospitalization but she sleeping much better now. Her mood is improved but her energy and motivation are still poor and she's developed tremor in both hands predicted on the left. 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 It's hard for her to type because of the tremor and she can't remember anything. She's no longer suicidal but just doesn't have much energy to do much of anything   The patient returns for follow-up after 6 months.  She states that she is doing very well.  She has been Christmas with her nephew's family.  She is still not heard anything from her daughter in more than 4 years but she seems to have come to terms with it.  She denies depression severe anxiety panic attacks  or suicidal ideation.  Her energy is good. Visit Diagnosis:    ICD-10-CM   1. Severe recurrent major depressive disorder with psychotic features (HCC)  F33.3     Past Psychiatric History: 2 prior hospitalizations for depression in 2004 and 2015  Past Medical History:  Past Medical History:  Diagnosis Date  . Asthma   . Chronic pain began in 2011 after jooint replacements  . Depression   . Reflux     Past Surgical  History:  Procedure Laterality Date  . ABDOMINAL HYSTERECTOMY    . BREAST CYST EXCISION Left   . FOOT SURGERY  2011 -both feet  . KNEE SURGERY     left knee  . SHOULDER SURGERY  2011    Family Psychiatric History: see below  Family History:  Family History  Problem Relation Age of Onset  . Bipolar disorder Sister   . Alcohol abuse Maternal Grandfather   . Breast cancer Maternal Aunt   . Breast cancer Maternal Grandmother   . Breast cancer Cousin   . Deep vein thrombosis Neg Hx   . Pulmonary embolism Neg Hx   . Heart disease Neg Hx   . Heart failure Neg Hx     Social History:  Social History   Socioeconomic History  . Marital status: Divorced    Spouse name: Not on file  . Number of children: Not on file  . Years of education: Not on file  . Highest education level: Not on file  Occupational History  . Not on file  Tobacco Use  . Smoking status: Former Smoker    Packs/day: 1.00    Years: 20.00    Pack years: 20.00    Types: Cigarettes  . Smokeless tobacco: Never Used  . Tobacco comment: currently 1/2 ppd 11/24/19//lmr  Substance and Sexual Activity  . Alcohol use: No    Alcohol/week: 2.0 standard drinks    Types: 2 Shots of liquor per week  . Drug use: No  . Sexual activity: Yes  Other Topics Concern  . Not on file  Social History Narrative  . Not on file   Social Determinants of Health   Financial Resource Strain: Not on file  Food Insecurity: Not on file  Transportation Needs: Not on file  Physical Activity: Not on file  Stress: Not on file  Social Connections: Not on file    Allergies:  Allergies  Allergen Reactions  . Sulfa Antibiotics Other (See Comments)    unknown    Metabolic Disorder Labs: No results found for: HGBA1C, MPG No results found for: PROLACTIN No results found for: CHOL, TRIG, HDL, CHOLHDL, VLDL, LDLCALC Lab Results  Component Value Date   TSH 2.286 10/11/2019   TSH 2.310 09/06/2013    Therapeutic Level Labs: No  results found for: LITHIUM No results found for: VALPROATE No components found for:  CBMZ  Current Medications: Current Outpatient Medications  Medication Sig Dispense Refill  . albuterol (PROVENTIL HFA;VENTOLIN HFA) 108 (90 BASE) MCG/ACT inhaler Inhale 2 puffs into the lungs every 4 (four) hours as needed for wheezing or shortness of breath.    Marland Kitchen albuterol (PROVENTIL) (2.5 MG/3ML) 0.083% nebulizer solution Take 2.5 mg by nebulization every 4 (four) hours as needed for wheezing or shortness of breath.    . Budeson-Glycopyrrol-Formoterol (BREZTRI AEROSPHERE) 160-9-4.8 MCG/ACT AERO Inhale 2 puffs into the lungs 2 (two) times daily. 10.7 g 11  . buPROPion (WELLBUTRIN XL) 300 MG 24 hr tablet Take 1 tablet (300 mg total) by  mouth every morning. 90 tablet 2  . CALCIUM PO Take 1 tablet by mouth daily.    . clonazePAM (KLONOPIN) 1 MG tablet Take 1 tablet (1 mg total) by mouth at bedtime. 30 tablet 5  . FLUoxetine (PROZAC) 40 MG capsule Take 1 capsule (40 mg total) by mouth daily. 90 capsule 2  . gabapentin (NEURONTIN) 300 MG capsule Take 1 capsule (300 mg total) by mouth 2 (two) times daily. For agitation/pain managment 180 capsule 2  . ibuprofen (ADVIL) 600 MG tablet Take 600 mg by mouth in the morning and at bedtime.    . pantoprazole (PROTONIX) 40 MG tablet Take 1 tablet (40 mg total) by mouth daily. For acid reflux    . rOPINIRole (REQUIP) 0.5 MG tablet Take 1 tablet (0.5 mg total) by mouth at bedtime. For restless leg syndrome     No current facility-administered medications for this visit.     Musculoskeletal: Strength & Muscle Tone: within normal limits Gait & Station: normal Patient leans: N/A  Psychiatric Specialty Exam: Review of Systems  All other systems reviewed and are negative.   There were no vitals taken for this visit.There is no height or weight on file to calculate BMI.  General Appearance: NA  Eye Contact:  NA  Speech:  Clear and Coherent  Volume:  Normal  Mood:   Euthymic  Affect:  NA  Thought Process:  Goal Directed  Orientation:  Full (Time, Place, and Person)  Thought Content: WDL   Suicidal Thoughts:  No  Homicidal Thoughts:  No  Memory:  Immediate;   Good Recent;   Good Remote;   Good  Judgement:  Good  Insight:  Good  Psychomotor Activity:  Normal  Concentration:  Concentration: Good and Attention Span: Good  Recall:  Good  Fund of Knowledge: Good  Language: Good  Akathisia:  No  Handed:  Right  AIMS (if indicated): not done  Assets:  Communication Skills Desire for Improvement Physical Health Resilience Social Support Talents/Skills  ADL's:  Intact  Cognition: WNL  Sleep:  Good   Screenings: AUDIT   Flowsheet Row [facility]  Alcohol Use Disorder Identification Test Final Score (AUDIT) 40       Assessment and Plan: This patient is a 65 year old female with a history of depression and anxiety.  She continues to do very well on her current regimen.  She will continue clonazepam 1 mg at bedtime for sleep and anxiety, gabapentin 300 mg twice daily for anxiety, Wellbutrin XL 300 mg as well as Prozac 40 mg daily for depression.  She will return to see me in 6 months or call sooner as needed   Diannia Ruder, MD 02/16/2020, 1:12 PM

## 2020-02-20 ENCOUNTER — Other Ambulatory Visit (HOSPITAL_COMMUNITY): Payer: Self-pay | Admitting: Family Medicine

## 2020-02-20 DIAGNOSIS — Z1231 Encounter for screening mammogram for malignant neoplasm of breast: Secondary | ICD-10-CM

## 2020-02-22 ENCOUNTER — Ambulatory Visit (HOSPITAL_COMMUNITY)
Admission: RE | Admit: 2020-02-22 | Discharge: 2020-02-22 | Disposition: A | Discharge status: Home / Self Care | Payer: Medicare Other | Source: Ambulatory Visit | Attending: Family Medicine | Admitting: Family Medicine

## 2020-02-22 ENCOUNTER — Other Ambulatory Visit: Payer: Self-pay

## 2020-02-22 DIAGNOSIS — Z1231 Encounter for screening mammogram for malignant neoplasm of breast: Secondary | ICD-10-CM | POA: Diagnosis not present

## 2020-02-27 DIAGNOSIS — D649 Anemia, unspecified: Secondary | ICD-10-CM | POA: Diagnosis not present

## 2020-02-27 DIAGNOSIS — R7309 Other abnormal glucose: Secondary | ICD-10-CM | POA: Diagnosis not present

## 2020-02-27 DIAGNOSIS — E785 Hyperlipidemia, unspecified: Secondary | ICD-10-CM | POA: Diagnosis not present

## 2020-02-27 DIAGNOSIS — Z0000 Encounter for general adult medical examination without abnormal findings: Secondary | ICD-10-CM | POA: Diagnosis not present

## 2020-02-27 DIAGNOSIS — Z23 Encounter for immunization: Secondary | ICD-10-CM | POA: Diagnosis not present

## 2020-03-22 ENCOUNTER — Other Ambulatory Visit: Payer: Self-pay | Admitting: *Deleted

## 2020-03-22 DIAGNOSIS — Z87891 Personal history of nicotine dependence: Secondary | ICD-10-CM

## 2020-04-11 ENCOUNTER — Ambulatory Visit (INDEPENDENT_AMBULATORY_CARE_PROVIDER_SITE_OTHER): Payer: Medicare Other | Admitting: Acute Care

## 2020-04-11 ENCOUNTER — Ambulatory Visit (HOSPITAL_COMMUNITY)
Admission: RE | Admit: 2020-04-11 | Discharge: 2020-04-11 | Disposition: A | Discharge status: Home / Self Care | Payer: Medicare Other | Source: Ambulatory Visit | Attending: Acute Care | Admitting: Acute Care

## 2020-04-11 ENCOUNTER — Other Ambulatory Visit: Payer: Self-pay

## 2020-04-11 ENCOUNTER — Encounter: Payer: Self-pay | Admitting: Acute Care

## 2020-04-11 DIAGNOSIS — Z122 Encounter for screening for malignant neoplasm of respiratory organs: Secondary | ICD-10-CM

## 2020-04-11 DIAGNOSIS — Z87891 Personal history of nicotine dependence: Secondary | ICD-10-CM | POA: Insufficient documentation

## 2020-04-11 NOTE — Patient Instructions (Signed)
Thank you for participating in the [facility]. It was our pleasure to meet you today. We will call you with the results of your scan within the next few days. Your scan will be assigned a Lung RADS category score by the physicians reading the scans.  This Lung RADS score determines follow up scanning.  See below for description of categories, and follow up screening recommendations. We will be in touch to schedule your follow up screening annually or based on recommendations of our providers. We will fax a copy of your scan results to your Primary Care Physician, or the physician who referred you to the program, to ensure they have the results. Please call the office if you have any questions or concerns regarding your scanning experience or results.  Our office number is [phone]. Please speak with Denise Phelps, RN. She is our Lung Cancer Screening RN. If she is unavailable when you call, please have the office staff send her a message. She will return your call at her earliest convenience. Remember, if your scan is normal, we will scan you annually as long as you continue to meet the criteria for the program. (Age 55-77, Current smoker or smoker who has quit within the last 15 years). If you are a smoker, remember, quitting is the single most powerful action that you can take to decrease your risk of lung cancer and other pulmonary, breathing related problems. We know quitting is hard, and we are here to help.  Please let us know if there is anything we can do to help you meet your goal of quitting. If you are a former smoker, congratulations. We are proud of you! Remain smoke free! Remember you can refer friends or family members through the number above.  We will screen them to make sure they meet criteria for the program. Thank you for helping us take better care of you by participating in Lung Screening.  Lung RADS Categories:  Lung RADS 1: no nodules  or definitely non-concerning nodules.  Recommendation is for a repeat annual scan in 12 months.  Lung RADS 2:  nodules that are non-concerning in appearance and behavior with a very low likelihood of becoming an active cancer. Recommendation is for a repeat annual scan in 12 months.  Lung RADS 3: nodules that are probably non-concerning , includes nodules with a low likelihood of becoming an active cancer.  Recommendation is for a 6-month repeat screening scan. Often noted after an upper respiratory illness. We will be in touch to make sure you have no questions, and to schedule your 6-month scan.  Lung RADS 4 A: nodules with concerning findings, recommendation is most often for a follow up scan in 3 months or additional testing based on our provider's assessment of the scan. We will be in touch to make sure you have no questions and to schedule the recommended 3 month follow up scan.  Lung RADS 4 B:  indicates findings that are concerning. We will be in touch with you to schedule additional diagnostic testing based on our provider's  assessment of the scan.   

## 2020-04-11 NOTE — Progress Notes (Signed)
Virtual Visit via Telephone Note  I connected with Rebecca [name] on 04/11/20 at 12:00 PM EST by telephone and verified that I am speaking with the correct person using two identifiers.  Location: Patient: At home Provider: [address] [place], [place], [address]   I discussed the limitations, risks, security and privacy concerns of performing an evaluation and management service by telephone and the availability of in person appointments. I also discussed with the patient that there may be a patient responsible charge related to this service. The patient expressed understanding and agreed to proceed.      Shared Decision Making Visit Lung Cancer Screening Program [phone])   Eligibility:  Age 65 y.o.  Pack Years Smoking History Calculation 35 pack year smoking history (# packs/per year x # years smoked)  Recent History of coughing up blood  no  Unexplained weight loss? no ( >Than 15 pounds within the last 6 months )  Prior History Lung / other cancer no (Diagnosis within the last 5 years already requiring surveillance chest CT Scans).  Smoking Status Former Smoker  Former Smokers: Years since quit: < 1 year  Quit Date: 11/2019  Visit Components:  Discussion included one or more decision making aids. yes  Discussion included risk/benefits of screening. yes  Discussion included potential follow up diagnostic testing for abnormal scans. yes  Discussion included meaning and risk of over diagnosis. yes  Discussion included meaning and risk of False Positives. yes  Discussion included meaning of total radiation exposure. yes  Counseling Included:  Importance of adherence to annual lung cancer LDCT screening. yes  Impact of comorbidities on ability to participate in the program. yes  Ability and willingness to under diagnostic treatment. yes  Smoking Cessation Counseling:  Current Smokers:   Discussed importance of smoking cessation.  yes  Information about tobacco cessation classes and interventions provided to patient. yes  Patient provided with "ticket" for LDCT Scan. yes  Symptomatic Patient. no  CounselingNA  Diagnosis Code: Tobacco Use Z72.0  Asymptomatic Patient yes  Counseling (Intermediate counseling: > three minutes counseling) C6237  Former Smokers:   Discussed the importance of maintaining cigarette abstinence. yes  Diagnosis Code: Personal History of Nicotine Dependence. S28.315  Information about tobacco cessation classes and interventions provided to patient. Yes  Patient provided with "ticket" for LDCT Scan. yes  Written Order for Lung Cancer Screening with LDCT placed in Epic. Yes (CT Chest Lung Cancer Screening Low Dose W/O CM) [id number] Z12.2-Screening of respiratory organs Z87.891-Personal history of nicotine dependence  I spent 25 minutes of face to face time with Rebecca [name] discussing the risks and benefits of lung cancer screening. We viewed a power point together that explained in detail the above noted topics. We took the time to pause the power point at intervals to allow for questions to be asked and answered to ensure understanding. We discussed that she had taken the single most powerful action possible to decrease her risk of developing lung cancer when she quit smoking. I counseled her to remain smoke free, and to contact me if she ever had the desire to smoke again so that I can provide resources and tools to help support the effort to remain smoke free. We discussed the time and location of the scan, and that either  Rebecca Miyamoto RN or I will call with the results within  24-48 hours of receiving them. She has my card and contact information in the event she needs to speak with me,  in addition to a copy of the power point we reviewed as a resource. She verbalized understanding of all of the above and had no further questions upon leaving the office.     I explained to the patient  that there has been a high incidence of coronary artery disease noted on these exams. I explained that this is a non-gated exam therefore degree or severity cannot be determined. This patient is currently on statin therapy. I have asked the patient to follow-up with their PCP regarding any incidental finding of coronary artery disease and management with diet or medication as they feel is clinically indicated. The patient verbalized understanding of the above and had no further questions.     [name], NP 04/11/2020

## 2020-04-13 DIAGNOSIS — J439 Emphysema, unspecified: Secondary | ICD-10-CM | POA: Insufficient documentation

## 2020-04-18 ENCOUNTER — Other Ambulatory Visit: Payer: Self-pay | Admitting: *Deleted

## 2020-04-18 DIAGNOSIS — Z87891 Personal history of nicotine dependence: Secondary | ICD-10-CM

## 2020-05-17 DIAGNOSIS — Z23 Encounter for immunization: Secondary | ICD-10-CM | POA: Diagnosis not present

## 2020-05-28 ENCOUNTER — Telehealth: Payer: Self-pay | Admitting: Internal Medicine

## 2020-05-28 NOTE — Telephone Encounter (Signed)
disregard

## 2020-06-26 DIAGNOSIS — J45909 Unspecified asthma, uncomplicated: Secondary | ICD-10-CM | POA: Diagnosis not present

## 2020-06-26 DIAGNOSIS — E785 Hyperlipidemia, unspecified: Secondary | ICD-10-CM | POA: Diagnosis not present

## 2020-06-26 DIAGNOSIS — E7800 Pure hypercholesterolemia, unspecified: Secondary | ICD-10-CM | POA: Diagnosis not present

## 2020-06-26 DIAGNOSIS — J4530 Mild persistent asthma, uncomplicated: Secondary | ICD-10-CM | POA: Diagnosis not present

## 2020-06-26 DIAGNOSIS — K219 Gastro-esophageal reflux disease without esophagitis: Secondary | ICD-10-CM | POA: Diagnosis not present

## 2020-06-26 DIAGNOSIS — J4520 Mild intermittent asthma, uncomplicated: Secondary | ICD-10-CM | POA: Diagnosis not present

## 2020-06-26 DIAGNOSIS — M1710 Unilateral primary osteoarthritis, unspecified knee: Secondary | ICD-10-CM | POA: Diagnosis not present

## 2020-06-26 DIAGNOSIS — M1990 Unspecified osteoarthritis, unspecified site: Secondary | ICD-10-CM | POA: Diagnosis not present

## 2020-06-26 DIAGNOSIS — G8929 Other chronic pain: Secondary | ICD-10-CM | POA: Diagnosis not present

## 2020-07-03 ENCOUNTER — Other Ambulatory Visit: Payer: Self-pay | Admitting: Nurse Practitioner

## 2020-07-03 DIAGNOSIS — Z13820 Encounter for screening for osteoporosis: Secondary | ICD-10-CM

## 2020-07-17 ENCOUNTER — Telehealth (HOSPITAL_COMMUNITY): Payer: Self-pay | Admitting: *Deleted

## 2020-07-17 NOTE — Telephone Encounter (Signed)
Patient is needing refills for all of her medications provider prescribes for her. Patient appt was cancelled due to provider being on medical leave. Message was left to call office to resch appt.  

## 2020-07-18 ENCOUNTER — Other Ambulatory Visit (HOSPITAL_COMMUNITY): Payer: Self-pay | Admitting: Psychiatry

## 2020-07-18 MED ORDER — GABAPENTIN 300 MG PO CAPS
300.0000 mg | ORAL_CAPSULE | Freq: Two times a day (BID) | ORAL | 2 refills | Status: DC
Start: 2020-07-18 — End: 2020-09-12

## 2020-07-18 MED ORDER — FLUOXETINE HCL 40 MG PO CAPS: 40.0000 mg | ORAL_CAPSULE | Freq: Every day | ORAL | 2 refills | Status: DC

## 2020-07-18 MED ORDER — CLONAZEPAM 1 MG PO TABS: 1.0000 mg | ORAL_TABLET | Freq: Every day | ORAL | 5 refills | Status: DC

## 2020-07-18 MED ORDER — BUPROPION HCL ER (XL) 300 MG PO TB24: 300.0000 mg | ORAL_TABLET | ORAL | 2 refills | Status: DC

## 2020-07-18 NOTE — Telephone Encounter (Signed)
sent 

## 2020-07-26 ENCOUNTER — Other Ambulatory Visit (HOSPITAL_COMMUNITY): Payer: Self-pay | Admitting: Nurse Practitioner

## 2020-07-26 DIAGNOSIS — Z13820 Encounter for screening for osteoporosis: Secondary | ICD-10-CM

## 2020-08-01 ENCOUNTER — Other Ambulatory Visit (HOSPITAL_COMMUNITY): Payer: Medicare Other

## 2020-08-09 ENCOUNTER — Encounter (HOSPITAL_COMMUNITY): Payer: Self-pay

## 2020-08-09 ENCOUNTER — Other Ambulatory Visit (HOSPITAL_COMMUNITY): Payer: Medicare Other

## 2020-08-15 ENCOUNTER — Telehealth (HOSPITAL_COMMUNITY): Payer: Medicare Other | Admitting: Psychiatry

## 2020-08-20 ENCOUNTER — Other Ambulatory Visit: Payer: Self-pay | Admitting: Internal Medicine

## 2020-08-20 MED ORDER — BREZTRI AEROSPHERE 160-9-4.8 MCG/ACT IN AERO: 2.0000 | INHALATION_SPRAY | Freq: Two times a day (BID) | RESPIRATORY_TRACT | 3 refills | Status: DC

## 2020-08-23 ENCOUNTER — Telehealth: Payer: Self-pay

## 2020-08-23 NOTE — Telephone Encounter (Signed)
Received fax from [employer] requesting either Medicare Beneficiary ID or SSN in order to continue utilizing the prescriptions savings plan for [facility]. Copy of Medicare A & B card faxed back. Nothing further needed at this time.  Phone# [phone] Fax# [phone]

## 2020-08-26 DIAGNOSIS — J4520 Mild intermittent asthma, uncomplicated: Secondary | ICD-10-CM | POA: Diagnosis not present

## 2020-08-26 DIAGNOSIS — K219 Gastro-esophageal reflux disease without esophagitis: Secondary | ICD-10-CM | POA: Diagnosis not present

## 2020-08-26 DIAGNOSIS — J4530 Mild persistent asthma, uncomplicated: Secondary | ICD-10-CM | POA: Diagnosis not present

## 2020-08-26 DIAGNOSIS — G8929 Other chronic pain: Secondary | ICD-10-CM | POA: Diagnosis not present

## 2020-08-31 MED ORDER — BREZTRI AEROSPHERE 160-9-4.8 MCG/ACT IN AERO: 2.0000 | INHALATION_SPRAY | Freq: Two times a day (BID) | RESPIRATORY_TRACT | 3 refills | Status: DC

## 2020-08-31 NOTE — Addendum Note (Signed)
Addended by: Arvilla Market on: 08/31/2020 02:28 PM   Modules accepted: Orders

## 2020-09-12 ENCOUNTER — Telehealth (INDEPENDENT_AMBULATORY_CARE_PROVIDER_SITE_OTHER): Payer: Medicare Other | Admitting: Psychiatry

## 2020-09-12 ENCOUNTER — Other Ambulatory Visit: Payer: Self-pay

## 2020-09-12 ENCOUNTER — Encounter (HOSPITAL_COMMUNITY): Payer: Self-pay | Admitting: Psychiatry

## 2020-09-12 DIAGNOSIS — F333 Major depressive disorder, recurrent, severe with psychotic symptoms: Secondary | ICD-10-CM | POA: Diagnosis not present

## 2020-09-12 MED ORDER — CLONAZEPAM 1 MG PO TABS: 1.0000 mg | ORAL_TABLET | Freq: Every day | ORAL | 5 refills | Status: DC

## 2020-09-12 MED ORDER — BUPROPION HCL ER (XL) 300 MG PO TB24: 300.0000 mg | ORAL_TABLET | ORAL | 2 refills | Status: DC

## 2020-09-12 MED ORDER — FLUOXETINE HCL 40 MG PO CAPS: 40.0000 mg | ORAL_CAPSULE | Freq: Every day | ORAL | 2 refills | Status: DC

## 2020-09-12 MED ORDER — GABAPENTIN 300 MG PO CAPS
300.0000 mg | ORAL_CAPSULE | Freq: Two times a day (BID) | ORAL | 2 refills | Status: DC
Start: 2020-09-12 — End: 2021-02-14

## 2020-09-12 NOTE — Progress Notes (Signed)
Virtual Visit via Telephone Note  I connected with Rebecca [name] on 09/12/20 at  2:20 PM EDT by telephone and verified that I am speaking with the correct person using two identifiers.  Location: Patient: home Provider: home office   I discussed the limitations, risks, security and privacy concerns of performing an evaluation and management service by telephone and the availability of in person appointments. I also discussed with the patient that there may be a patient responsible charge related to this service. The patient expressed understanding and agreed to proceed.     I discussed the assessment and treatment plan with the patient. The patient was provided an opportunity to ask questions and all were answered. The patient agreed with the plan and demonstrated an understanding of the instructions.   The patient was advised to call back or seek an in-person evaluation if the symptoms worsen or if the condition fails to improve as anticipated.  I provided 15 minutes of non-face-to-face time during this encounter.   Diannia Ruder, MD  [facility] MD/PA/NP OP Progress Note  09/12/2020 3:15 PM Rebecca [name]  MRN:  [id number]  Chief Complaint:  Chief Complaint   Anxiety; Depression; Follow-up    HPI: Patient is a 65 year-old separated white female who lives alone in [place]. 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 She has one daughter and 2 grandchildren  The patient returns for follow-up after 6 months.  She states that she continues to do very well.  She still has not heard from her daughter and its been 5 years.  She has decided not to worry about it as there is nothing she can do.  She is enjoying time with her friends and family.  Her mood is good she has good energy she has not had no recent health changes.  She is sleeping and eating well.  She feels that her current regimen is working quite well for her Visit Diagnosis:    ICD-10-CM    1. Severe recurrent major depressive disorder with psychotic features (HCC)  F33.3       Past Psychiatric History: 2 prior hospitalizations for depression in 2004 and 2015  Past Medical History:  Past Medical History:  Diagnosis Date   Asthma    Chronic pain began in 2011 after jooint replacements   Depression    Reflux     Past Surgical History:  Procedure Laterality Date   ABDOMINAL HYSTERECTOMY     BREAST CYST EXCISION Left    FOOT SURGERY  2011 -both feet   KNEE SURGERY     left knee   SHOULDER SURGERY  2011    Family Psychiatric History: see below  Family History:  Family History  Problem Relation Age of Onset   Bipolar disorder Sister    Alcohol abuse Maternal Grandfather    Breast cancer Maternal Aunt    Breast cancer Maternal Grandmother    Breast cancer Cousin    Deep vein thrombosis Neg Hx    Pulmonary embolism Neg Hx    Heart disease Neg Hx    Heart failure Neg Hx     Social History:  Social History   Socioeconomic History   Marital status: Divorced    Spouse name: Not on file   Number of children: Not on file   Years of education: Not on file   Highest education level: Not on file  Occupational History   Not on file  Tobacco  Use   Smoking status: Former    Packs/day: 1.00    Years: 35.00    Pack years: 35.00    Types: Cigarettes    Quit date: 02/2019    Years since quitting: 1.6   Smokeless tobacco: Never   Tobacco comments:    not smoking-AH/04/11/2020  Substance and Sexual Activity   Alcohol use: No    Alcohol/week: 2.0 standard drinks    Types: 2 Shots of liquor per week   Drug use: No   Sexual activity: Yes  Other Topics Concern   Not on file  Social History Narrative   Not on file   Social Determinants of Health   Financial Resource Strain: Not on file  Food Insecurity: Not on file  Transportation Needs: Not on file  Physical Activity: Not on file  Stress: Not on file  Social Connections: Not on file    Allergies:   Allergies  Allergen Reactions   Other Other (See Comments)   Sulfa Antibiotics Other (See Comments)    unknown    Metabolic Disorder Labs: No results found for: HGBA1C, MPG No results found for: PROLACTIN No results found for: CHOL, TRIG, HDL, CHOLHDL, VLDL, LDLCALC Lab Results  Component Value Date   TSH 2.286 10/11/2019   TSH 2.310 09/06/2013    Therapeutic Level Labs: No results found for: LITHIUM No results found for: VALPROATE No components found for:  CBMZ  Current Medications: Current Outpatient Medications  Medication Sig Dispense Refill   albuterol (PROVENTIL HFA;VENTOLIN HFA) 108 (90 BASE) MCG/ACT inhaler Inhale 2 puffs into the lungs every 4 (four) hours as needed for wheezing or shortness of breath.     albuterol (PROVENTIL) (2.5 MG/3ML) 0.083% nebulizer solution Take 2.5 mg by nebulization every 4 (four) hours as needed for wheezing or shortness of breath.     Budeson-Glycopyrrol-Formoterol (BREZTRI AEROSPHERE) 160-9-4.8 MCG/ACT AERO Inhale 2 puffs into the lungs 2 (two) times daily. 32.1 g 3   buPROPion (WELLBUTRIN XL) 300 MG 24 hr tablet Take 1 tablet (300 mg total) by mouth every morning. 90 tablet 2   CALCIUM PO Take 1 tablet by mouth daily.     clonazePAM (KLONOPIN) 1 MG tablet Take 1 tablet (1 mg total) by mouth at bedtime. 30 tablet 5   FLUoxetine (PROZAC) 40 MG capsule Take 1 capsule (40 mg total) by mouth daily. 90 capsule 2   gabapentin (NEURONTIN) 300 MG capsule Take 1 capsule (300 mg total) by mouth 2 (two) times daily. For agitation/pain managment 180 capsule 2   ibuprofen (ADVIL) 600 MG tablet Take 600 mg by mouth in the morning and at bedtime.     pantoprazole (PROTONIX) 40 MG tablet Take 1 tablet (40 mg total) by mouth daily. For acid reflux     rOPINIRole (REQUIP) 0.5 MG tablet Take 1 tablet (0.5 mg total) by mouth at bedtime. For restless leg syndrome     No current facility-administered medications for this visit.      Musculoskeletal: Strength & Muscle Tone: within normal limits Gait & Station: normal Patient leans: N/A  Psychiatric Specialty Exam: Review of Systems  All other systems reviewed and are negative.  There were no vitals taken for this visit.There is no height or weight on file to calculate BMI.  General Appearance: NA  Eye Contact:  NA  Speech:  Clear and Coherent  Volume:  Normal  Mood:  Euthymic  Affect:  NA  Thought Process:  Goal Directed  Orientation:  Full (Time, Place,  and Person)  Thought Content: WDL   Suicidal Thoughts:  No  Homicidal Thoughts:  No  Memory:  Immediate;   Good Recent;   Good Remote;   Good  Judgement:  Good  Insight:  Good  Psychomotor Activity:  Normal  Concentration:  Concentration: Good and Attention Span: Good  Recall:  Good  Fund of Knowledge: Good  Language: Good  Akathisia:  No  Handed:  Right  AIMS (if indicated): not done  Assets:  Communication Skills Desire for Improvement Physical Health Resilience Social Support Talents/Skills  ADL's:  Intact  Cognition: WNL  Sleep:  Good   Screenings: AUDIT    Flowsheet Row [facility]  Alcohol Use Disorder Identification Test Final Score (AUDIT) 40      PHQ2-9    Flowsheet Row [facility]  PHQ-2 Total Score 0      Flowsheet Row Video Visit from 09/12/2020 [facility]  C-SSRS RISK CATEGORY No Risk        Assessment and Plan: This patient is a 64 year old female with a history of depression and anxiety.  She is doing very well on her current regimen.  She will continue clonazepam 1 mg at bedtime for sleep and anxiety, gabapentin 300 mg twice daily for anxiety, Wellbutrin XL 300 mg as well as Prozac 40 mg daily for depression.  She will return to see me in 6 months   Diannia Ruder,  MD 09/12/2020, 3:15 PM

## 2020-10-23 DIAGNOSIS — J4520 Mild intermittent asthma, uncomplicated: Secondary | ICD-10-CM | POA: Diagnosis not present

## 2020-10-23 DIAGNOSIS — K219 Gastro-esophageal reflux disease without esophagitis: Secondary | ICD-10-CM | POA: Diagnosis not present

## 2020-10-23 DIAGNOSIS — M1710 Unilateral primary osteoarthritis, unspecified knee: Secondary | ICD-10-CM | POA: Diagnosis not present

## 2020-10-23 DIAGNOSIS — J4530 Mild persistent asthma, uncomplicated: Secondary | ICD-10-CM | POA: Diagnosis not present

## 2020-10-23 DIAGNOSIS — G8929 Other chronic pain: Secondary | ICD-10-CM | POA: Diagnosis not present

## 2020-10-23 DIAGNOSIS — E7800 Pure hypercholesterolemia, unspecified: Secondary | ICD-10-CM | POA: Diagnosis not present

## 2020-10-23 DIAGNOSIS — J45909 Unspecified asthma, uncomplicated: Secondary | ICD-10-CM | POA: Diagnosis not present

## 2020-10-23 DIAGNOSIS — E785 Hyperlipidemia, unspecified: Secondary | ICD-10-CM | POA: Diagnosis not present

## 2020-10-23 DIAGNOSIS — M1990 Unspecified osteoarthritis, unspecified site: Secondary | ICD-10-CM | POA: Diagnosis not present

## 2021-02-13 ENCOUNTER — Telehealth (HOSPITAL_COMMUNITY): Payer: Self-pay | Admitting: *Deleted

## 2021-02-13 NOTE — Telephone Encounter (Signed)
Patient called stating that she would like to change her pharmacy to [facility]. Pt pharmacy called stating they would like for all of patient medications to be transfer to them now. Appt was made for patient.

## 2021-02-14 ENCOUNTER — Other Ambulatory Visit (HOSPITAL_COMMUNITY): Payer: Self-pay | Admitting: Psychiatry

## 2021-02-14 MED ORDER — CLONAZEPAM 1 MG PO TABS: 1.0000 mg | ORAL_TABLET | Freq: Every day | ORAL | 5 refills | Status: DC

## 2021-02-14 MED ORDER — FLUOXETINE HCL 40 MG PO CAPS: 40.0000 mg | ORAL_CAPSULE | Freq: Every day | ORAL | 2 refills | Status: DC

## 2021-02-14 MED ORDER — GABAPENTIN 300 MG PO CAPS: 300.0000 mg | ORAL_CAPSULE | Freq: Two times a day (BID) | ORAL | 2 refills | Status: DC

## 2021-02-14 NOTE — Telephone Encounter (Signed)
sent 

## 2021-02-21 ENCOUNTER — Telehealth (HOSPITAL_COMMUNITY): Payer: Self-pay | Admitting: *Deleted

## 2021-02-21 ENCOUNTER — Other Ambulatory Visit (HOSPITAL_COMMUNITY): Payer: Self-pay | Admitting: Psychiatry

## 2021-02-21 MED ORDER — BUPROPION HCL ER (XL) 300 MG PO TB24: 300.0000 mg | ORAL_TABLET | ORAL | 2 refills | Status: DC

## 2021-02-21 NOTE — Telephone Encounter (Signed)
Patient pharmacy requesting refills for patient Wellburtin XL.

## 2021-02-21 NOTE — Telephone Encounter (Signed)
sent 

## 2021-02-25 DIAGNOSIS — N959 Unspecified menopausal and perimenopausal disorder: Secondary | ICD-10-CM | POA: Diagnosis not present

## 2021-02-25 DIAGNOSIS — Z0000 Encounter for general adult medical examination without abnormal findings: Secondary | ICD-10-CM | POA: Diagnosis not present

## 2021-02-25 DIAGNOSIS — J431 Panlobular emphysema: Secondary | ICD-10-CM | POA: Diagnosis not present

## 2021-02-25 DIAGNOSIS — M1990 Unspecified osteoarthritis, unspecified site: Secondary | ICD-10-CM | POA: Diagnosis not present

## 2021-02-25 DIAGNOSIS — J45909 Unspecified asthma, uncomplicated: Secondary | ICD-10-CM | POA: Diagnosis not present

## 2021-02-25 DIAGNOSIS — E785 Hyperlipidemia, unspecified: Secondary | ICD-10-CM | POA: Diagnosis not present

## 2021-02-25 DIAGNOSIS — J4520 Mild intermittent asthma, uncomplicated: Secondary | ICD-10-CM | POA: Diagnosis not present

## 2021-02-25 DIAGNOSIS — K219 Gastro-esophageal reflux disease without esophagitis: Secondary | ICD-10-CM | POA: Diagnosis not present

## 2021-02-25 DIAGNOSIS — J4530 Mild persistent asthma, uncomplicated: Secondary | ICD-10-CM | POA: Diagnosis not present

## 2021-02-25 DIAGNOSIS — M1710 Unilateral primary osteoarthritis, unspecified knee: Secondary | ICD-10-CM | POA: Diagnosis not present

## 2021-02-25 DIAGNOSIS — E7800 Pure hypercholesterolemia, unspecified: Secondary | ICD-10-CM | POA: Diagnosis not present

## 2021-02-25 DIAGNOSIS — G8929 Other chronic pain: Secondary | ICD-10-CM | POA: Diagnosis not present

## 2021-02-25 NOTE — Telephone Encounter (Signed)
noted 

## 2021-03-04 ENCOUNTER — Other Ambulatory Visit (HOSPITAL_COMMUNITY): Payer: Self-pay | Admitting: Family Medicine

## 2021-03-04 DIAGNOSIS — Z1231 Encounter for screening mammogram for malignant neoplasm of breast: Secondary | ICD-10-CM

## 2021-03-06 ENCOUNTER — Inpatient Hospital Stay (HOSPITAL_COMMUNITY): Admission: RE | Admit: 2021-03-06 | Payer: PPO | Source: Ambulatory Visit

## 2021-03-06 ENCOUNTER — Ambulatory Visit (HOSPITAL_COMMUNITY)
Admission: RE | Admit: 2021-03-06 | Discharge: 2021-03-06 | Disposition: A | Discharge status: Home / Self Care | Payer: PPO | Source: Ambulatory Visit | Attending: Family Medicine | Admitting: Family Medicine

## 2021-03-06 ENCOUNTER — Other Ambulatory Visit: Payer: Self-pay

## 2021-03-06 ENCOUNTER — Ambulatory Visit (HOSPITAL_COMMUNITY)
Admission: RE | Admit: 2021-03-06 | Discharge: 2021-03-06 | Disposition: A | Discharge status: Home / Self Care | Payer: PPO | Source: Ambulatory Visit | Attending: Nurse Practitioner | Admitting: Nurse Practitioner

## 2021-03-06 ENCOUNTER — Encounter (HOSPITAL_COMMUNITY): Payer: Self-pay

## 2021-03-06 ENCOUNTER — Encounter (HOSPITAL_COMMUNITY): Payer: PPO

## 2021-03-06 DIAGNOSIS — Z780 Asymptomatic menopausal state: Secondary | ICD-10-CM | POA: Diagnosis not present

## 2021-03-06 DIAGNOSIS — M8589 Other specified disorders of bone density and structure, multiple sites: Secondary | ICD-10-CM | POA: Diagnosis not present

## 2021-03-06 DIAGNOSIS — Z1231 Encounter for screening mammogram for malignant neoplasm of breast: Secondary | ICD-10-CM | POA: Diagnosis not present

## 2021-03-06 DIAGNOSIS — Z13820 Encounter for screening for osteoporosis: Secondary | ICD-10-CM | POA: Insufficient documentation

## 2021-03-14 ENCOUNTER — Other Ambulatory Visit (HOSPITAL_COMMUNITY): Payer: Self-pay | Admitting: Psychiatry

## 2021-03-18 ENCOUNTER — Telehealth (INDEPENDENT_AMBULATORY_CARE_PROVIDER_SITE_OTHER): Payer: PPO | Admitting: Psychiatry

## 2021-03-18 ENCOUNTER — Other Ambulatory Visit: Payer: Self-pay

## 2021-03-18 ENCOUNTER — Encounter (HOSPITAL_COMMUNITY): Payer: Self-pay | Admitting: Psychiatry

## 2021-03-18 DIAGNOSIS — F333 Major depressive disorder, recurrent, severe with psychotic symptoms: Secondary | ICD-10-CM | POA: Diagnosis not present

## 2021-03-18 MED ORDER — GABAPENTIN 300 MG PO CAPS: 300.0000 mg | ORAL_CAPSULE | Freq: Two times a day (BID) | ORAL | 2 refills | Status: DC

## 2021-03-18 MED ORDER — BUPROPION HCL ER (XL) 300 MG PO TB24: 300.0000 mg | ORAL_TABLET | ORAL | 2 refills | Status: DC

## 2021-03-18 MED ORDER — CLONAZEPAM 1 MG PO TABS: 1.0000 mg | ORAL_TABLET | Freq: Every day | ORAL | 5 refills | Status: DC

## 2021-03-18 MED ORDER — FLUOXETINE HCL 40 MG PO CAPS: 40.0000 mg | ORAL_CAPSULE | Freq: Every day | ORAL | 2 refills | Status: DC

## 2021-03-18 NOTE — Progress Notes (Signed)
Virtual Visit via Telephone Note  I connected with Rebecca [name] on 03/18/21 at  2:40 PM EST by telephone and verified that I am speaking with the correct person using two identifiers.  Location: Patient: home Provider: office   I discussed the limitations, risks, security and privacy concerns of performing an evaluation and management service by telephone and the availability of in person appointments. I also discussed with the patient that there may be a patient responsible charge related to this service. The patient expressed understanding and agreed to proceed.      I discussed the assessment and treatment plan with the patient. The patient was provided an opportunity to ask questions and all were answered. The patient agreed with the plan and demonstrated an understanding of the instructions.   The patient was advised to call back or seek an in-person evaluation if the symptoms worsen or if the condition fails to improve as anticipated.  I provided 15 minutes of non-face-to-face time during this encounter.   [name], MD  [facility] MD/PA/NP OP Progress Note  03/18/2021 2:49 PM Rebecca [name]  MRN:  [id number]  Chief Complaint:  Chief Complaint   Depression; Anxiety; Follow-up    HPI: Patient is a 66 year-old separated white female who lives alone in [place]. 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 She has one daughter and 2 grandchildren  The patient returns after 6 months regarding her depression and anxiety.  She continues to do very well.  She is still not had any connection with her daughter and the daughter refuses to contact her.  Is going on 61 years now.  She states there is nothing more that she can do.  She is very close to her nephew and his family as well as other family members.  Her parents are both still alive although her mother was recently diagnosed with breast cancer.  Nevertheless the patient states her mood  has been good she denies significant depression or anxiety.  She is sleeping well and she has had no health changes.  Feels that her current regimen is working well for her Visit Diagnosis:    ICD-10-CM   1. Severe recurrent major depressive disorder with psychotic features ([place])  F33.3       Past Psychiatric History: 2 prior hospitalizations for depression in 2004 and 2015  Past Medical History:  Past Medical History:  Diagnosis Date   Asthma    Chronic pain began in 2011 after jooint replacements   Depression    Reflux     Past Surgical History:  Procedure Laterality Date   ABDOMINAL HYSTERECTOMY     BREAST CYST EXCISION Left    FOOT SURGERY  2011 -both feet   KNEE SURGERY     left knee   SHOULDER SURGERY  2011    Family Psychiatric History: see below  Family History:  Family History  Problem Relation Age of Onset   Bipolar disorder Sister    Alcohol abuse Maternal Grandfather    Breast cancer Maternal Aunt    Breast cancer Maternal Grandmother    Breast cancer Cousin    Deep vein thrombosis Neg Hx    Pulmonary embolism Neg Hx    Heart disease Neg Hx    Heart failure Neg Hx     Social History:  Social History   Socioeconomic History   Marital status: Divorced    Spouse name: Not on file   Number  of children: Not on file   Years of education: Not on file   Highest education level: Not on file  Occupational History   Not on file  Tobacco Use   Smoking status: Former    Packs/day: 1.00    Years: 35.00    Pack years: 35.00    Types: Cigarettes    Quit date: 02/2019    Years since quitting: 2.1   Smokeless tobacco: Never   Tobacco comments:    not smoking-AH/04/11/2020  Substance and Sexual Activity   Alcohol use: No    Alcohol/week: 2.0 standard drinks    Types: 2 Shots of liquor per week   Drug use: No   Sexual activity: Yes  Other Topics Concern   Not on file  Social History Narrative   Not on file   Social Determinants of Health    Financial Resource Strain: Not on file  Food Insecurity: Not on file  Transportation Needs: Not on file  Physical Activity: Not on file  Stress: Not on file  Social Connections: Not on file    Allergies:  Allergies  Allergen Reactions   Other Other (See Comments)   Sulfa Antibiotics Other (See Comments)    unknown    Metabolic Disorder Labs: No results found for: HGBA1C, MPG No results found for: PROLACTIN No results found for: CHOL, TRIG, HDL, CHOLHDL, VLDL, LDLCALC Lab Results  Component Value Date   TSH 2.286 10/11/2019   TSH 2.310 09/06/2013    Therapeutic Level Labs: No results found for: LITHIUM No results found for: VALPROATE No components found for:  CBMZ  Current Medications: Current Outpatient Medications  Medication Sig Dispense Refill   albuterol (PROVENTIL HFA;VENTOLIN HFA) 108 (90 BASE) MCG/ACT inhaler Inhale 2 puffs into the lungs every 4 (four) hours as needed for wheezing or shortness of breath.     albuterol (PROVENTIL) (2.5 MG/3ML) 0.083% nebulizer solution Take 2.5 mg by nebulization every 4 (four) hours as needed for wheezing or shortness of breath.     Budeson-Glycopyrrol-Formoterol (BREZTRI AEROSPHERE) 160-9-4.8 MCG/ACT AERO Inhale 2 puffs into the lungs 2 (two) times daily. 32.1 g 3   buPROPion (WELLBUTRIN XL) 300 MG 24 hr tablet Take 1 tablet (300 mg total) by mouth every morning. 90 tablet 2   CALCIUM PO Take 1 tablet by mouth daily.     clonazePAM (KLONOPIN) 1 MG tablet Take 1 tablet (1 mg total) by mouth at bedtime. 30 tablet 5   FLUoxetine (PROZAC) 40 MG capsule Take 1 capsule (40 mg total) by mouth daily. 90 capsule 2   gabapentin (NEURONTIN) 300 MG capsule Take 1 capsule (300 mg total) by mouth 2 (two) times daily. For agitation/pain managment 180 capsule 2   ibuprofen (ADVIL) 600 MG tablet Take 600 mg by mouth in the morning and at bedtime.     pantoprazole (PROTONIX) 40 MG tablet Take 1 tablet (40 mg total) by mouth daily. For acid  reflux     rOPINIRole (REQUIP) 0.5 MG tablet Take 1 tablet (0.5 mg total) by mouth at bedtime. For restless leg syndrome     No current facility-administered medications for this visit.     Musculoskeletal: Strength & Muscle Tone: na Gait & Station: na Patient leans: N/A  Psychiatric Specialty Exam: Review of Systems  All other systems reviewed and are negative.  There were no vitals taken for this visit.There is no height or weight on file to calculate BMI.  General Appearance: NA  Eye Contact:  NA  Speech:  Clear and Coherent  Volume:  Normal  Mood:  Euthymic  Affect:  NA  Thought Process:  Goal Directed  Orientation:  Full (Time, Place, and Person)  Thought Content: WDL   Suicidal Thoughts:  No  Homicidal Thoughts:  No  Memory:  Immediate;   Good Recent;   Good Remote;   Good  Judgement:  Good  Insight:  Good  Psychomotor Activity:  Normal  Concentration:  Concentration: Good and Attention Span: Good  Recall:  Good  Fund of Knowledge: Good  Language: Good  Akathisia:  No  Handed:  Right  AIMS (if indicated): not done  Assets:  Communication Skills Desire for Improvement Physical Health Resilience Social Support Talents/Skills  ADL's:  Intact  Cognition: WNL  Sleep:  Good   Screenings: AUDIT    Flowsheet Row Admission (Discharged) from 09/04/2013 in [facility]  Alcohol Use Disorder Identification Test Final Score (AUDIT) 40      PHQ2-9    Flowsheet Row Video Visit from 03/18/2021 in [facility] Video Visit from 09/12/2020 in [facility]  PHQ-2 Total Score 0 0      Flowsheet Row Video Visit from 03/18/2021 in [facility] Video Visit from 09/12/2020 in [facility] No Risk No Risk        Assessment and Plan: This patient is a  66 year old female with a history of depression and anxiety.  She continues to do well on her current regimen.  She will continue clonazepam 1 mg at bedtime for sleep and anxiety, gabapentin 300 mg twice daily for anxiety, Wellbutrin XL 300 mg as well as Prozac 40 mg daily for depression.  She will return to see me in 30-month   [name], MD 03/18/2021, 2:49 PM

## 2021-03-19 ENCOUNTER — Telehealth (HOSPITAL_COMMUNITY): Payer: Self-pay | Admitting: Psychiatry

## 2021-04-16 DIAGNOSIS — M1990 Unspecified osteoarthritis, unspecified site: Secondary | ICD-10-CM | POA: Diagnosis not present

## 2021-04-16 DIAGNOSIS — G2581 Restless legs syndrome: Secondary | ICD-10-CM | POA: Diagnosis not present

## 2021-04-16 DIAGNOSIS — J4520 Mild intermittent asthma, uncomplicated: Secondary | ICD-10-CM | POA: Diagnosis not present

## 2021-05-08 ENCOUNTER — Other Ambulatory Visit: Payer: Self-pay | Admitting: Student in an Organized Health Care Education/Training Program

## 2021-05-21 ENCOUNTER — Other Ambulatory Visit: Payer: Self-pay

## 2021-05-21 NOTE — Patient Outreach (Signed)
[facility]) Care Management ? ?05/21/2021 ? ?Rebecca [name] ?09/02/1955 ?[id number] ? ? ?Patient returning call for emmi and would like care management services. Went over engagement tool and assigned Rebecca Montgomery, RN Care Coordinator for follow up. ? ?Rebecca [name] ?[place] Management Assistant ?[phone] ? ?

## 2021-05-31 ENCOUNTER — Telehealth: Payer: Self-pay

## 2021-05-31 NOTE — Telephone Encounter (Signed)
Left voicemail with call back number for patient to call to schedule annual LDCT  

## 2021-06-04 ENCOUNTER — Telehealth: Payer: Self-pay | Admitting: Acute Care

## 2021-06-04 ENCOUNTER — Other Ambulatory Visit: Payer: Self-pay | Admitting: *Deleted

## 2021-06-04 ENCOUNTER — Telehealth: Payer: Self-pay | Admitting: *Deleted

## 2021-06-04 DIAGNOSIS — Z87891 Personal history of nicotine dependence: Secondary | ICD-10-CM

## 2021-06-04 DIAGNOSIS — Z122 Encounter for screening for malignant neoplasm of respiratory organs: Secondary | ICD-10-CM

## 2021-06-04 MED ORDER — BREZTRI AEROSPHERE 160-9-4.8 MCG/ACT IN AERO
2.0000 | INHALATION_SPRAY | Freq: Two times a day (BID) | RESPIRATORY_TRACT | 3 refills | Status: DC
Start: 2021-06-04 — End: 2021-07-12

## 2021-06-04 NOTE — Telephone Encounter (Signed)
Received fax from [employer] requesting a new prescription for Breztri.  Patient has not been seen in the office since 11/24/19.  ATC patient x1.  The line rang and then went to a busy signal.  Unable to leave a VM.  Will attempt to call her again later. ? ?Called and spoke with patient, advised that we needed to schedule an OV as she had not been seen since 11/24/2019.  She scheduled to see Dr. Melvyn Novas on 08/05/21 at 11:30 am.  Advised to arrive by 11:15 am for check in.  She also stated that she had been trying to get her LDCT screening rescheduled and had not been able to reach anyone.  Advised I would send a message to our staff to contact her.  She verbalized understanding.  Nothing further needed. ?

## 2021-06-04 NOTE — Telephone Encounter (Signed)
Spoke with pt and scheduled yearly lung screening CT at [facility] 06/18/21. Pt verbalized understanding. Nothing further needed at this time.  ?

## 2021-06-04 NOTE — Telephone Encounter (Signed)
Script faxed to [employer] for [employer]. ?

## 2021-06-18 ENCOUNTER — Ambulatory Visit (HOSPITAL_COMMUNITY)
Admission: RE | Admit: 2021-06-18 | Discharge: 2021-06-18 | Disposition: A | Discharge status: Home / Self Care | Payer: PPO | Source: Ambulatory Visit | Attending: Acute Care | Admitting: Acute Care

## 2021-06-18 DIAGNOSIS — Z87891 Personal history of nicotine dependence: Secondary | ICD-10-CM | POA: Insufficient documentation

## 2021-06-18 DIAGNOSIS — Z122 Encounter for screening for malignant neoplasm of respiratory organs: Secondary | ICD-10-CM | POA: Diagnosis not present

## 2021-06-20 ENCOUNTER — Other Ambulatory Visit: Payer: Self-pay | Admitting: Acute Care

## 2021-06-20 DIAGNOSIS — Z122 Encounter for screening for malignant neoplasm of respiratory organs: Secondary | ICD-10-CM

## 2021-06-20 DIAGNOSIS — Z87891 Personal history of nicotine dependence: Secondary | ICD-10-CM

## 2021-06-21 ENCOUNTER — Other Ambulatory Visit: Payer: Self-pay

## 2021-06-21 MED ORDER — BREZTRI AEROSPHERE 160-9-4.8 MCG/ACT IN AERO: 2.0000 | INHALATION_SPRAY | Freq: Two times a day (BID) | RESPIRATORY_TRACT | 11 refills | Status: DC

## 2021-06-21 NOTE — Telephone Encounter (Signed)
Papers filled out waiting for Dr. Sherene Sires to sign

## 2021-06-21 NOTE — Telephone Encounter (Signed)
Papers filled out signed and faxed to [employer].  Acopy of application in [facility] office and original copy mailed to patient as requested. Nothing further needed.

## 2021-06-21 NOTE — Telephone Encounter (Signed)
Pt states pharmacist from [facility] called pt and denied the Breztri due to needing Medicare card.  [employer] stated she had to submit a new application.  Pt brought in paperwork to be filled out for Dr. Melvyn Novas for financial assistance.  Pt requested original paperwork be mailed back to her once they're faxed.  Please advise.  Phone and address verified. Pt has appt 08/05/21 with Dr. Melvyn Novas.

## 2021-07-06 ENCOUNTER — Encounter: Payer: Self-pay | Admitting: Internal Medicine

## 2021-07-08 ENCOUNTER — Telehealth: Payer: Self-pay | Admitting: Internal Medicine

## 2021-07-08 ENCOUNTER — Other Ambulatory Visit: Payer: Self-pay

## 2021-07-08 MED ORDER — BREZTRI AEROSPHERE 160-9-4.8 MCG/ACT IN AERO
2.0000 | INHALATION_SPRAY | Freq: Two times a day (BID) | RESPIRATORY_TRACT | 11 refills | Status: DC
Start: 2021-07-08 — End: 2021-09-02

## 2021-07-08 NOTE — Telephone Encounter (Signed)
Was addressed with patient earlier in [place] message. Forms have been faxed to Dr. Sherene Sires along with new rx for Dr. Sherene Sires to sign.

## 2021-07-12 MED ORDER — BREZTRI AEROSPHERE 160-9-4.8 MCG/ACT IN AERO: 2.0000 | INHALATION_SPRAY | Freq: Two times a day (BID) | RESPIRATORY_TRACT | 3 refills | Status: DC

## 2021-08-05 ENCOUNTER — Ambulatory Visit: Payer: PPO | Admitting: Internal Medicine

## 2021-09-02 ENCOUNTER — Ambulatory Visit: Payer: PPO | Admitting: Internal Medicine

## 2021-09-02 ENCOUNTER — Encounter: Payer: Self-pay | Admitting: Internal Medicine

## 2021-09-02 DIAGNOSIS — J4530 Mild persistent asthma, uncomplicated: Secondary | ICD-10-CM | POA: Diagnosis not present

## 2021-09-02 NOTE — Patient Instructions (Addendum)
Plan A = Automatic = Always=    Breztri Take 2 puffs first thing in am and then another 2 puffs about 12 hours later.    Work on inhaler technique:  relax and gently blow all the way out then take a nice smooth full deep breath back in, triggering the inhaler at same time you start breathing in.  Hold breath in for at least  5 seconds if you can. Blow out breztri   thru nose. Rinse and gargle with water when done.  If mouth or throat bother you at all,  try brushing teeth/gums/tongue with arm and hammer toothpaste/ make a slurry and gargle and spit out.       Plan B = Backup (to supplement plan A, not to replace it) Only use your albuterol inhaler as a rescue medication to be used if you can't catch your breath by resting or doing a relaxed purse lip breathing pattern.  - The less you use it, the better it will work when you need it. - Ok to use the inhaler up to 2 puffs  every 4 hours if you must but call for appointment if use goes up over your usual need - Don't leave home without it !!  (think of it like the spare tire for your car)   Plan C = Crisis (instead of Plan B but only if Plan B stops working) - only use your albuterol nebulizer if you first try Plan B and it fails to help > ok to use the nebulizer up to every 4 hours but if start needing it regularly call for immediate appointment  Ok to try albuterol 15 min before an activity (on alternating days)  that you know would usually make you short of breath and see if it makes any difference and if makes none then don't take albuterol after activity unless you can't catch your breath as this means it's the resting that helps, not the albuterol.  Please schedule a follow up visit in 12 months but call sooner if needed

## 2021-09-02 NOTE — Progress Notes (Unsigned)
Rebecca [name], female    DOB: 1955/11/20,   MRN: [id number]   Brief patient profile:  66 yowf quit smoking 2021  RN with onset early twenties and was doing fine on advair 250 until Dec 2020 and no better on 500 with cough/wheeze/ sob no better then changed to symicort 160 which seemed to help for a weeks then symptoms resumed so referred to pulmonary clinic in [place]  10/11/2019 by Dr   Rebecca [name]    Baseline wt 242 summer 07/2017   History of Present Illness  10/11/2019  Pulmonary/ 1st office eval/Rebecca [name]  Chief Complaint  Patient presents with   Pulmonary Consult    Referred by Dr. Silvestre Moment.  Pt c/o cough with clear sputum in the am's and wheezing x 6 months. She also c/o SOB "just about all the time"- worse with exertion.   Dyspnea:  HC parking due to arthritis back and L knee one aisle at grocery  Cough: more in am's > thick white  Sleep: bed is flat/ R side  SABA use: ?  Improves p saba - never rechallenges rec Plan A = Automatic = Always=    Breztri Take 2 puffs first thing in am and then another 2 puffs about 12 hours later.  Plan B = Backup (to supplement plan A, not to replace it) Only use your albuterol inhaler as a rescue medication Plan C = Crisis (instead of Plan B but only if Plan B stops working) - only use your albuterol nebulizer if you first try Plan B  Also:  Try albuterol 15 min before an activity that you know would make you short of breath and see if it makes any difference and if makes none then don't take it after activity unless you can't catch your breath.  Try to quit smoking if at all possible    11/24/2019  f/u ov/[place] office/Rebecca [name] re: AB/still smoking with ERV 22% Chief Complaint  Patient presents with   Follow-up    Breathing has improved since the last visit. She is using her albuterol inhaler inhaler 3 x per wk and has not used neb.    Dyspnea:  Climbing on yard equipment, mb and back easier  Cough: none  Sleeping: flat bed one pillow R  side fine  SABA use: much less 02: not using  Rec The key is to stop smoking completely before smoking completely stops you! Weight control is simply a matter of calorie balance      09/02/2021  f/u ov/[place] office/Rebecca [name] re: AB maint on Breztri   Chief Complaint  Patient presents with   Follow-up    Breztri working well.    Dyspnea:  walking to bird feeders 50 uphill s stopping / garage anohter 25 ft overall easier on brezti  Cough: none Sleeping: on R Side down / bed is flat  3 pillows SABA use: neb 4 x weekly / no hfa  02: none  Covid status: vax up to date    No obvious day to day or daytime variability or assoc excess/ purulent sputum or mucus plugs or hemoptysis or cp or chest tightness, subjective wheeze or overt sinus or hb symptoms.   Sleeping  without nocturnal  or early am exacerbation  of respiratory  c/o's or need for noct saba. Also denies any obvious fluctuation of symptoms with weather or environmental changes or other aggravating or alleviating factors except as outlined above   No unusual exposure hx or h/o childhood pna/ asthma or  knowledge of premature birth.  Current Allergies, Complete Past Medical History, Past Surgical History, Family History, and Social History were reviewed in [employer] record.  ROS  The following are not active complaints unless bolded Hoarseness, sore throat, dysphagia, dental problems, itching, sneezing,  nasal congestion or discharge of excess mucus or purulent secretions, ear ache,   fever, chills, sweats, unintended wt loss or wt gain, classically pleuritic or exertional cp,  orthopnea pnd or arm/hand swelling  or leg swelling, presyncope, palpitations, abdominal pain, anorexia, nausea, vomiting, diarrhea  or change in bowel habits or change in bladder habits, change in stools or change in urine, dysuria, hematuria,  rash, arthralgias, visual complaints, headache, numbness, weakness or ataxia or problems with  walking or coordination,  change in mood or  memory.        Current Meds  Medication Sig   albuterol (PROVENTIL HFA;VENTOLIN HFA) 108 (90 BASE) MCG/ACT inhaler Inhale 2 puffs into the lungs every 4 (four) hours as needed for wheezing or shortness of breath.   albuterol (PROVENTIL) (2.5 MG/3ML) 0.083% nebulizer solution Take 2.5 mg by nebulization every 4 (four) hours as needed for wheezing or shortness of breath.   Budeson-Glycopyrrol-Formoterol (BREZTRI AEROSPHERE) 160-9-4.8 MCG/ACT AERO Inhale 2 puffs into the lungs in the morning and at bedtime.   buPROPion (WELLBUTRIN XL) 300 MG 24 hr tablet Take 1 tablet (300 mg total) by mouth every morning.   CALCIUM PO Take 1 tablet by mouth daily.   clonazePAM (KLONOPIN) 1 MG tablet Take 1 tablet (1 mg total) by mouth at bedtime.   FLUoxetine (PROZAC) 40 MG capsule Take 1 capsule (40 mg total) by mouth daily.   gabapentin (NEURONTIN) 300 MG capsule Take 1 capsule (300 mg total) by mouth 2 (two) times daily. For agitation/pain managment   meloxicam (MOBIC) 15 MG tablet Take 15 mg by mouth every morning.   pantoprazole (PROTONIX) 40 MG tablet Take 1 tablet (40 mg total) by mouth daily. For acid reflux   rOPINIRole (REQUIP) 0.5 MG tablet Take 1 tablet (0.5 mg total) by mouth at bedtime. For restless leg syndrome                       Objective:     09/02/2021       273   11/24/19 261 lb (118.4 kg)  10/11/19 263 lb (119.3 kg)  07/05/19 259 lb (117.5 kg)    Vital signs reviewed  09/02/2021  - Note at rest 02 sats  97% on RA   General appearance:    obese wf nad   HEENT : Oropharynx  clear     Nasal turbinates nl    NECK :  without  apparent JVD/ palpable Nodes/TM    LUNGS: no acc muscle use,  Nl contour chest which is clear to A and P bilaterally without cough on insp or exp maneuvers   CV:  RRR  no s3 or murmur or increase in P2, and no edema   ABD: obese soft and nontender with nl inspiratory excursion in the supine position. No  bruits or organomegaly appreciated   MS:  Nl gait/ ext warm without deformities Or obvious joint restrictions  calf tenderness, cyanosis or clubbing    SKIN: warm and dry without lesions    NEURO:  alert, approp, nl sensorium with  no motor or cerebellar deficits apparent.           I personally reviewed images and agree with  radiology impression as follows:   Chest CT 06/18/21  = LDSCT  1. Lung-RADS 2, benign appearance or behavior. Continue annual screening with low-dose chest CT without contrast in 12 months. 2. Emphysema (ICD10-J43.9        Assessment

## 2021-09-03 ENCOUNTER — Encounter: Payer: Self-pay | Admitting: Internal Medicine

## 2021-09-03 NOTE — Assessment & Plan Note (Signed)
ERV 22% by PFTs  11/07/19   Body mass index is 48.4 kg/m.  -  trending up  Lab Results  Component Value Date   TSH 2.286 10/11/2019      Contributing to doe and risk of GERD >>>   reviewed the need and the process to achieve and maintain neg calorie balance > defer f/u primary care including intermittently monitoring thyroid status             Each maintenance medication was reviewed in detail including emphasizing most importantly the difference between maintenance and prns and under what circumstances the prns are to be triggered using an action plan format where appropriate.  Total time for H and P, chart review, counseling, reviewing hfa device(s) and generating customized AVS unique to this office visit / same day charting = 26 min

## 2021-09-03 NOTE — Assessment & Plan Note (Signed)
Quit smoking 2021  - 10/11/2019  After extensive coaching inhaler device,  effectiveness =    75% (short Ti)  > try breztri 2bid  - Allergy profile 10/11/2019 >  Eos 0.5 /  IgE  121 > 10/13/2019 rec add singulair  - alpha one AT  10/11/19   MM   158  - PFT's  11/07/2019    FEV1 1.69 (68 % ) ratio 0.79  p 29 % improvement from saba p 0 prior to study with DLCO  19.99 (94%) corrects to 4.23 (126%)  for alv volume and FV curve min concave  And ERV 22%  > see AB  - LDSCT  06/18/21 mil  Emphysema  - The proper method of use, as well as anticipated side effects, of a metered-dose inhaler were discussed and demonstrated to the patient using teach back method.     Group D (now reclassified as E) in terms of symptom/risk and laba/lama/ICS  therefore appropriate rx at this point >>>  Continue breztri and more approp saba  Re SABA :  I spent extra time with pt today reviewing appropriate use of albuterol for prn use on exertion with the following points: 1) saba is for relief of sob that does not improve by walking a slower pace or resting but rather if the pt does not improve after trying this first. 2) If the pt is convinced, as many are, that saba helps recover from activity faster then it's easy to tell if this is the case by re-challenging : ie stop, take the inhaler, then p 5 minutes try the exact same activity (intensity of workload) that just caused the symptoms and see if they are substantially diminished or not after saba 3) if there is an activity that reproducibly causes the symptoms, try the saba 15 min before the activity on alternate days   If in fact the saba really does help, then fine to continue to use it prn but advised may need to look closer at the maintenance regimen being used to achieve better control of airways disease with exertion.

## 2021-09-16 ENCOUNTER — Telehealth (INDEPENDENT_AMBULATORY_CARE_PROVIDER_SITE_OTHER): Payer: PPO | Admitting: Psychiatry

## 2021-09-16 ENCOUNTER — Encounter (HOSPITAL_COMMUNITY): Payer: Self-pay | Admitting: Psychiatry

## 2021-09-16 DIAGNOSIS — F3342 Major depressive disorder, recurrent, in full remission: Secondary | ICD-10-CM | POA: Diagnosis not present

## 2021-09-16 MED ORDER — GABAPENTIN 300 MG PO CAPS: 300.0000 mg | ORAL_CAPSULE | Freq: Two times a day (BID) | ORAL | 2 refills | Status: DC

## 2021-09-16 MED ORDER — BUPROPION HCL ER (XL) 300 MG PO TB24: 300.0000 mg | ORAL_TABLET | ORAL | 2 refills | Status: DC

## 2021-09-16 MED ORDER — CLONAZEPAM 1 MG PO TABS: 1.0000 mg | ORAL_TABLET | Freq: Every day | ORAL | 5 refills | Status: DC

## 2021-09-16 MED ORDER — FLUOXETINE HCL 40 MG PO CAPS: 40.0000 mg | ORAL_CAPSULE | Freq: Every day | ORAL | 2 refills | Status: DC

## 2021-09-16 NOTE — Progress Notes (Signed)
Virtual Visit via Telephone Note  I connected with Rebecca [name] on 09/16/21 at 11:00 AM EDT by telephone and verified that I am speaking with the correct person using two identifiers.  Location: Patient: home Provider: home office   I discussed the limitations, risks, security and privacy concerns of performing an evaluation and management service by telephone and the availability of in person appointments. I also discussed with the patient that there may be a patient responsible charge related to this service. The patient expressed understanding and agreed to proceed.       I discussed the assessment and treatment plan with the patient. The patient was provided an opportunity to ask questions and all were answered. The patient agreed with the plan and demonstrated an understanding of the instructions.   The patient was advised to call back or seek an in-person evaluation if the symptoms worsen or if the condition fails to improve as anticipated.  I provided 15 minutes of non-face-to-face time during this encounter.   Diannia Ruder, MD  [facility] MD/PA/NP OP Progress Note  09/16/2021 11:15 AM Rebecca [name]  MRN:  [id number]  Chief Complaint:  Chief Complaint  Patient presents with   Anxiety   Depression   Follow-up   HPI: Patient is a 66 year-old separated white female who lives alone in [place]. 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 She has one daughter and 2 grandchildren  The patient returns for follow-up after 6 months regarding her depression and anxiety.  She continues to do very well.  She is very close to her niece, her great niece and other extended family.  Her daughter is still not maintaining any contact with her or anyone else in the family.  She states that she is come to terms with this.  She is reached out to her daughter numerous times and really cannot do anything more.  She states that her mood is stable and she  denies significant depression anxiety difficulty sleeping or suicidal ideation. Visit Diagnosis:    ICD-10-CM   1. Recurrent major depressive disorder, in full remission (HCC)  F33.42       Past Psychiatric History: 2 hospitalizations for depression in 2004 in 2015  Past Medical History:  Past Medical History:  Diagnosis Date   Asthma    Chronic pain began in 2011 after jooint replacements   Depression    Reflux     Past Surgical History:  Procedure Laterality Date   ABDOMINAL HYSTERECTOMY     BREAST CYST EXCISION Left    FOOT SURGERY  2011 -both feet   KNEE SURGERY     left knee   SHOULDER SURGERY  2011    Family Psychiatric History: see below  Family History:  Family History  Problem Relation Age of Onset   Bipolar disorder Sister    Alcohol abuse Maternal Grandfather    Breast cancer Maternal Aunt    Breast cancer Maternal Grandmother    Breast cancer Cousin    Deep vein thrombosis Neg Hx    Pulmonary embolism Neg Hx    Heart disease Neg Hx    Heart failure Neg Hx     Social History:  Social History   Socioeconomic History   Marital status: Divorced    Spouse name: Not on file   Number of children: Not on file   Years of education: Not on file   Highest education level: Not on file  Occupational History   Not on file  Tobacco Use   Smoking status: Former    Packs/day: 1.00    Years: 35.00    Total pack years: 35.00    Types: Cigarettes    Quit date: 02/2019    Years since quitting: 2.6   Smokeless tobacco: Never   Tobacco comments:    not smoking-AH/04/11/2020  Substance and Sexual Activity   Alcohol use: No    Alcohol/week: 2.0 standard drinks of alcohol    Types: 2 Shots of liquor per week   Drug use: No   Sexual activity: Yes  Other Topics Concern   Not on file  Social History Narrative   Not on file   Social Determinants of Health   Financial Resource Strain: Not on file  Food Insecurity: Not on file  Transportation Needs: Not on  file  Physical Activity: Not on file  Stress: Not on file  Social Connections: Not on file    Allergies:  Allergies  Allergen Reactions   Other Other (See Comments)   Sulfa Antibiotics Other (See Comments)    unknown    Metabolic Disorder Labs: No results found for: "HGBA1C", "MPG" No results found for: "PROLACTIN" No results found for: "CHOL", "TRIG", "HDL", "CHOLHDL", "VLDL", "LDLCALC" Lab Results  Component Value Date   TSH 2.286 10/11/2019   TSH 2.310 09/06/2013    Therapeutic Level Labs: No results found for: "LITHIUM" No results found for: "VALPROATE" No results found for: "CBMZ"  Current Medications: Current Outpatient Medications  Medication Sig Dispense Refill   albuterol (PROVENTIL HFA;VENTOLIN HFA) 108 (90 BASE) MCG/ACT inhaler Inhale 2 puffs into the lungs every 4 (four) hours as needed for wheezing or shortness of breath.     albuterol (PROVENTIL) (2.5 MG/3ML) 0.083% nebulizer solution Take 2.5 mg by nebulization every 4 (four) hours as needed for wheezing or shortness of breath.     Budeson-Glycopyrrol-Formoterol (BREZTRI AEROSPHERE) 160-9-4.8 MCG/ACT AERO Inhale 2 puffs into the lungs in the morning and at bedtime. 32.1 g 3   buPROPion (WELLBUTRIN XL) 300 MG 24 hr tablet Take 1 tablet (300 mg total) by mouth every morning. 90 tablet 2   CALCIUM PO Take 1 tablet by mouth daily.     clonazePAM (KLONOPIN) 1 MG tablet Take 1 tablet (1 mg total) by mouth at bedtime. 30 tablet 5   FLUoxetine (PROZAC) 40 MG capsule Take 1 capsule (40 mg total) by mouth daily. 90 capsule 2   gabapentin (NEURONTIN) 300 MG capsule Take 1 capsule (300 mg total) by mouth 2 (two) times daily. For agitation/pain managment 180 capsule 2   meloxicam (MOBIC) 15 MG tablet Take 15 mg by mouth every morning.     pantoprazole (PROTONIX) 40 MG tablet Take 1 tablet (40 mg total) by mouth daily. For acid reflux     rOPINIRole (REQUIP) 0.5 MG tablet Take 1 tablet (0.5 mg total) by mouth at bedtime.  For restless leg syndrome     No current facility-administered medications for this visit.     Musculoskeletal: Strength & Muscle Tone: na Gait & Station: na Patient leans: N/A  Psychiatric Specialty Exam: Review of Systems  Respiratory:  Positive for shortness of breath.   All other systems reviewed and are negative.   There were no vitals taken for this visit.There is no height or weight on file to calculate BMI.  General Appearance: NA  Eye Contact:  NA  Speech:  Clear and Coherent  Volume:  Normal  Mood:  Euthymic  Affect:  NA  Thought Process:  Goal Directed  Orientation:  Full (Time, Place, and Person)  Thought Content: WDL   Suicidal Thoughts:  No  Homicidal Thoughts:  No  Memory:  Immediate;   Good Recent;   Good Remote;   Good  Judgement:  Good  Insight:  Good  Psychomotor Activity:  Normal  Concentration:  Concentration: Good and Attention Span: Good  Recall:  Good  Fund of Knowledge: Good  Language: Good  Akathisia:  No  Handed:  Right  AIMS (if indicated): not done  Assets:  Communication Skills Desire for Improvement Physical Health Resilience Social Support  ADL's:  Intact  Cognition: WNL  Sleep:  Good   Screenings: AUDIT    Flowsheet Row [facility]  Alcohol Use Disorder Identification Test Final Score (AUDIT) 40      PHQ2-9    Flowsheet Row [facility]2021 [facility] Video Visit from 03/18/2021 [facility] Video Visit from 09/12/2020 [facility]  PHQ-2 Total Score 0 0 0      Flowsheet Row Video Visit from 09/16/2021 [facility] Video Visit from 03[facility]18/2021 [facility] Video Visit from [facility]2020 [facility]  C-SSRS RISK CATEGORY No Risk No Risk No Risk        Assessment and Plan: This patient is a 66 year old female with a history of depression and anxiety.  She is doing well on her current regimen.  She will continue clonazepam 1 mg at bedtime for sleep and anxiety, gabapentin 300 mg daily for anxiety, Wellbutrin XL 300 mg as well as Prozac 40 mg daily for depression.  She will return to see me in 6 months  Collaboration of Care: Collaboration of Care: Primary Care Provider AEB notes will be shared with PCP at patient's request  Patient/Guardian was advised Release of Information must be obtained prior to any record release in order to collaborate their care with an outside provider. Patient/Guardian was advised if they have not already done so to contact the registration department to sign all necessary forms in order for [place] to release information regarding their care.   Consent: Patient/Guardian gives verbal consent for treatment and assignment of benefits for services provided during this visit. Patient/Guardian expressed understanding and agreed to proceed.    Diannia Ruder, MD 09/16/2021, 11:15 AM

## 2021-11-01 DIAGNOSIS — K219 Gastro-esophageal reflux disease without esophagitis: Secondary | ICD-10-CM | POA: Diagnosis not present

## 2021-11-01 DIAGNOSIS — G2581 Restless legs syndrome: Secondary | ICD-10-CM | POA: Diagnosis not present

## 2021-11-06 DIAGNOSIS — E785 Hyperlipidemia, unspecified: Secondary | ICD-10-CM | POA: Diagnosis not present

## 2021-11-06 DIAGNOSIS — M1990 Unspecified osteoarthritis, unspecified site: Secondary | ICD-10-CM | POA: Diagnosis not present

## 2021-11-06 DIAGNOSIS — J4530 Mild persistent asthma, uncomplicated: Secondary | ICD-10-CM | POA: Diagnosis not present

## 2021-11-06 DIAGNOSIS — K219 Gastro-esophageal reflux disease without esophagitis: Secondary | ICD-10-CM | POA: Diagnosis not present

## 2021-11-06 DIAGNOSIS — G8929 Other chronic pain: Secondary | ICD-10-CM | POA: Diagnosis not present

## 2021-12-28 DIAGNOSIS — Z6841 Body Mass Index (BMI) 40.0 and over, adult: Secondary | ICD-10-CM | POA: Diagnosis not present

## 2021-12-28 DIAGNOSIS — J219 Acute bronchiolitis, unspecified: Secondary | ICD-10-CM | POA: Diagnosis not present

## 2021-12-28 DIAGNOSIS — R030 Elevated blood-pressure reading, without diagnosis of hypertension: Secondary | ICD-10-CM | POA: Diagnosis not present

## 2022-02-28 DIAGNOSIS — Z6841 Body Mass Index (BMI) 40.0 and over, adult: Secondary | ICD-10-CM | POA: Diagnosis not present

## 2022-02-28 DIAGNOSIS — J431 Panlobular emphysema: Secondary | ICD-10-CM | POA: Diagnosis not present

## 2022-02-28 DIAGNOSIS — E785 Hyperlipidemia, unspecified: Secondary | ICD-10-CM | POA: Diagnosis not present

## 2022-02-28 DIAGNOSIS — K219 Gastro-esophageal reflux disease without esophagitis: Secondary | ICD-10-CM | POA: Diagnosis not present

## 2022-02-28 DIAGNOSIS — Z23 Encounter for immunization: Secondary | ICD-10-CM | POA: Diagnosis not present

## 2022-02-28 DIAGNOSIS — M8580 Other specified disorders of bone density and structure, unspecified site: Secondary | ICD-10-CM | POA: Diagnosis not present

## 2022-02-28 DIAGNOSIS — G2581 Restless legs syndrome: Secondary | ICD-10-CM | POA: Diagnosis not present

## 2022-02-28 DIAGNOSIS — Z1231 Encounter for screening mammogram for malignant neoplasm of breast: Secondary | ICD-10-CM | POA: Diagnosis not present

## 2022-02-28 DIAGNOSIS — M1710 Unilateral primary osteoarthritis, unspecified knee: Secondary | ICD-10-CM | POA: Diagnosis not present

## 2022-02-28 DIAGNOSIS — Z0000 Encounter for general adult medical examination without abnormal findings: Secondary | ICD-10-CM | POA: Diagnosis not present

## 2022-02-28 DIAGNOSIS — Z780 Asymptomatic menopausal state: Secondary | ICD-10-CM | POA: Diagnosis not present

## 2022-02-28 LAB — LAB REPORT - SCANNED
A1c: 5.4
EGFR: 70

## 2022-03-18 ENCOUNTER — Telehealth (HOSPITAL_COMMUNITY): Payer: PPO | Admitting: Psychiatry

## 2022-03-18 ENCOUNTER — Encounter (HOSPITAL_COMMUNITY): Payer: Self-pay | Admitting: Psychiatry

## 2022-03-18 DIAGNOSIS — F3342 Major depressive disorder, recurrent, in full remission: Secondary | ICD-10-CM

## 2022-03-18 MED ORDER — CLONAZEPAM 1 MG PO TABS: 1.0000 mg | ORAL_TABLET | Freq: Every day | ORAL | 5 refills | Status: DC

## 2022-03-18 MED ORDER — BUPROPION HCL ER (XL) 300 MG PO TB24: 300.0000 mg | ORAL_TABLET | ORAL | 2 refills | Status: DC

## 2022-03-18 MED ORDER — FLUOXETINE HCL 40 MG PO CAPS: 40.0000 mg | ORAL_CAPSULE | Freq: Every day | ORAL | 2 refills | Status: DC

## 2022-03-18 MED ORDER — GABAPENTIN 300 MG PO CAPS: 300.0000 mg | ORAL_CAPSULE | Freq: Two times a day (BID) | ORAL | 2 refills | Status: DC

## 2022-03-18 NOTE — Progress Notes (Signed)
Virtual Visit via Telephone Note  I connected with Rebecca [name] on 03/18/22 at  1:00 PM EST by telephone and verified that I am speaking with the correct person using two identifiers.  Location: Patient: home Provider: office   I discussed the limitations, risks, security and privacy concerns of performing an evaluation and management service by telephone and the availability of in person appointments. I also discussed with the patient that there may be a patient responsible charge related to this service. The patient expressed understanding and agreed to proceed.      I discussed the assessment and treatment plan with the patient. The patient was provided an opportunity to ask questions and all were answered. The patient agreed with the plan and demonstrated an understanding of the instructions.   The patient was advised to call back or seek an in-person evaluation if the symptoms worsen or if the condition fails to improve as anticipated.  I provided 15 minutes of non-face-to-face time during this encounter.   [name], MD  [facility] MD/PA/NP OP Progress Note  03/18/2022 1:14 PM Rebecca [name]  MRN:  [id number]  Chief Complaint:  Chief Complaint  Patient presents with   Depression   Anxiety   Follow-up   HPI: Patient is a 67 year-old separated white female who lives alone in [place]. 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 She has one daughter and 2 grandchildren   The patient returns for follow-up after 6 months regarding her depression and anxiety.  By her report she continues to do very well.  She is going to [place] a lot to help care for her elderly parents.  Her mother has metastatic breast cancer and recently took a bad fall.  She is still not had any contact with her daughter but has spent a lot of time with other family members such as her niece and nephew great-niece etc.  The patient denies significant depression  anxiety trouble sleeping or is thoughts of self-harm or suicide.  She states that her medications are working very well and does not want any changes. Visit Diagnosis:    ICD-10-CM   1. Recurrent major depressive disorder, in full remission ([place])  F33.42       Past Psychiatric History: 2 hospitalizations for depression in 2004 and 2015  Past Medical History:  Past Medical History:  Diagnosis Date   Asthma    Chronic pain began in 2011 after jooint replacements   Depression    Reflux     Past Surgical History:  Procedure Laterality Date   ABDOMINAL HYSTERECTOMY     BREAST CYST EXCISION Left    FOOT SURGERY  2011 -both feet   KNEE SURGERY     left knee   SHOULDER SURGERY  2011    Family Psychiatric History: See below  Family History:  Family History  Problem Relation Age of Onset   Bipolar disorder Sister    Alcohol abuse Maternal Grandfather    Breast cancer Maternal Aunt    Breast cancer Maternal Grandmother    Breast cancer Cousin    Deep vein thrombosis Neg Hx    Pulmonary embolism Neg Hx    Heart disease Neg Hx    Heart failure Neg Hx     Social History:  Social History   Socioeconomic History   Marital status: Divorced    Spouse name: Not on file   Number of children: Not on file  Years of education: Not on file   Highest education level: Not on file  Occupational History   Not on file  Tobacco Use   Smoking status: Former    Packs/day: 1.00    Years: 35.00    Total pack years: 35.00    Types: Cigarettes    Quit date: 02/2019    Years since quitting: 3.1   Smokeless tobacco: Never   Tobacco comments:    not smoking-AH/04/11/2020  Substance and Sexual Activity   Alcohol use: No    Alcohol/week: 2.0 standard drinks of alcohol    Types: 2 Shots of liquor per week   Drug use: No   Sexual activity: Yes  Other Topics Concern   Not on file  Social History Narrative   Not on file   Social Determinants of Health   Financial Resource Strain:  Not on file  Food Insecurity: Not on file  Transportation Needs: Not on file  Physical Activity: Not on file  Stress: Not on file  Social Connections: Not on file    Allergies:  Allergies  Allergen Reactions   Other Other (See Comments)   Sulfa Antibiotics Other (See Comments)    unknown    Metabolic Disorder Labs: No results found for: "HGBA1C", "MPG" No results found for: "PROLACTIN" No results found for: "CHOL", "TRIG", "HDL", "CHOLHDL", "VLDL", "LDLCALC" Lab Results  Component Value Date   TSH 2.286 10/11/2019   TSH 2.310 09/06/2013    Therapeutic Level Labs: No results found for: "LITHIUM" No results found for: "VALPROATE" No results found for: "CBMZ"  Current Medications: Current Outpatient Medications  Medication Sig Dispense Refill   albuterol (PROVENTIL HFA;VENTOLIN HFA) 108 (90 BASE) MCG/ACT inhaler Inhale 2 puffs into the lungs every 4 (four) hours as needed for wheezing or shortness of breath.     albuterol (PROVENTIL) (2.5 MG/3ML) 0.083% nebulizer solution Take 2.5 mg by nebulization every 4 (four) hours as needed for wheezing or shortness of breath.     Budeson-Glycopyrrol-Formoterol (BREZTRI AEROSPHERE) 160-9-4.8 MCG/ACT AERO Inhale 2 puffs into the lungs in the morning and at bedtime. 32.1 g 3   buPROPion (WELLBUTRIN XL) 300 MG 24 hr tablet Take 1 tablet (300 mg total) by mouth every morning. 90 tablet 2   CALCIUM PO Take 1 tablet by mouth daily.     clonazePAM (KLONOPIN) 1 MG tablet Take 1 tablet (1 mg total) by mouth at bedtime. 30 tablet 5   FLUoxetine (PROZAC) 40 MG capsule Take 1 capsule (40 mg total) by mouth daily. 90 capsule 2   gabapentin (NEURONTIN) 300 MG capsule Take 1 capsule (300 mg total) by mouth 2 (two) times daily. For agitation/pain managment 180 capsule 2   meloxicam (MOBIC) 15 MG tablet Take 15 mg by mouth every morning.     pantoprazole (PROTONIX) 40 MG tablet Take 1 tablet (40 mg total) by mouth daily. For acid reflux     rOPINIRole  (REQUIP) 0.5 MG tablet Take 1 tablet (0.5 mg total) by mouth at bedtime. For restless leg syndrome     No current facility-administered medications for this visit.     Musculoskeletal: Strength & Muscle Tone: na Gait & Station: na Patient leans: N/A  Psychiatric Specialty Exam: Review of Systems  All other systems reviewed and are negative.   There were no vitals taken for this visit.There is no height or weight on file to calculate BMI.  General Appearance: NA  Eye Contact:  NA  Speech:  Clear and Coherent  Volume:  Normal  Mood:  Euthymic  Affect:  NA  Thought Process:  Goal Directed  Orientation:  Full (Time, Place, and Person)  Thought Content: WDL   Suicidal Thoughts:  No  Homicidal Thoughts:  No  Memory:  Immediate;   Good Recent;   Good Remote;   Fair  Judgement:  Good  Insight:  Good  Psychomotor Activity:  Normal  Concentration:  Concentration: Good and Attention Span: Good  Recall:  Good  Fund of Knowledge: Good  Language: Good  Akathisia:  No  Handed:  Right  AIMS (if indicated): not done  Assets:  Communication Skills Desire for Improvement Physical Health Resilience Social Support Talents/Skills  ADL's:  Intact  Cognition: WNL  Sleep:  Good   Screenings: AUDIT    Flowsheet Row Admission (Discharged) from 09/04/2013 in [facility]  Alcohol Use Disorder Identification Test Final Score (AUDIT) 40      PHQ2-9    Flowsheet Row Video Visit from 09/16/2021 in [facility] at [place] Video Visit from 03/18/2021 in [place] at [place] Video Visit from 09/12/2020 in [place] [facility] Total Score 0 0 0      Flowsheet Row Video Visit from 09/16/2021 in [place] at [place] Video Visit from 03/18/2021 in [place] at [place] Video Visit from 09/12/2020  in [place] at [place] No Risk No Risk No Risk        Assessment and Plan: This patient is a 67 year old female with a history of depression and anxiety.  She continues to do well on her current regimen.  She will continue clonazepam 1 mg at bedtime for sleep and anxiety, gabapentin 300 mg twice daily for anxiety, Wellbutrin XL 300 mg daily as well as Prozac 40 mg daily for depression.  She will return to see me in 6 months  Collaboration of Care: Collaboration of Care: Primary Care Provider AEB notes will be shared with PCP at patient's request  Patient/Guardian was advised Release of Information must be obtained prior to any record release in order to collaborate their care with an outside provider. Patient/Guardian was advised if they have not already done so to contact the registration department to sign all necessary forms in order for [place] to release information regarding their care.   Consent: Patient/Guardian gives verbal consent for treatment and assignment of benefits for services provided during this visit. Patient/Guardian expressed understanding and agreed to proceed.    [name], MD 03/18/2022, 1:15 PM

## 2022-04-04 ENCOUNTER — Emergency Department (HOSPITAL_COMMUNITY)
Admission: EM | Admit: 2022-04-04 | Discharge: 2022-04-04 | Disposition: A | Discharge status: Home / Self Care | Payer: PPO | Attending: Emergency Medicine | Admitting: Emergency Medicine

## 2022-04-04 ENCOUNTER — Other Ambulatory Visit: Payer: Self-pay

## 2022-04-04 ENCOUNTER — Emergency Department (HOSPITAL_COMMUNITY): Payer: PPO

## 2022-04-04 ENCOUNTER — Encounter (HOSPITAL_COMMUNITY): Payer: Self-pay | Admitting: Emergency Medicine

## 2022-04-04 DIAGNOSIS — W010XXA Fall on same level from slipping, tripping and stumbling without subsequent striking against object, initial encounter: Secondary | ICD-10-CM | POA: Diagnosis not present

## 2022-04-04 DIAGNOSIS — S5012XA Contusion of left forearm, initial encounter: Secondary | ICD-10-CM | POA: Diagnosis not present

## 2022-04-04 DIAGNOSIS — S5780XA Crushing injury of unspecified forearm, initial encounter: Secondary | ICD-10-CM | POA: Diagnosis not present

## 2022-04-04 DIAGNOSIS — M25512 Pain in left shoulder: Secondary | ICD-10-CM | POA: Diagnosis not present

## 2022-04-04 DIAGNOSIS — J45909 Unspecified asthma, uncomplicated: Secondary | ICD-10-CM | POA: Diagnosis not present

## 2022-04-04 DIAGNOSIS — Z043 Encounter for examination and observation following other accident: Secondary | ICD-10-CM | POA: Diagnosis not present

## 2022-04-04 DIAGNOSIS — S40012A Contusion of left shoulder, initial encounter: Secondary | ICD-10-CM | POA: Insufficient documentation

## 2022-04-04 DIAGNOSIS — W19XXXA Unspecified fall, initial encounter: Secondary | ICD-10-CM | POA: Diagnosis not present

## 2022-04-04 DIAGNOSIS — Z96652 Presence of left artificial knee joint: Secondary | ICD-10-CM | POA: Diagnosis not present

## 2022-04-04 DIAGNOSIS — S8002XA Contusion of left knee, initial encounter: Secondary | ICD-10-CM | POA: Diagnosis not present

## 2022-04-04 DIAGNOSIS — M25522 Pain in left elbow: Secondary | ICD-10-CM | POA: Diagnosis not present

## 2022-04-04 DIAGNOSIS — R600 Localized edema: Secondary | ICD-10-CM | POA: Diagnosis not present

## 2022-04-04 DIAGNOSIS — I10 Essential (primary) hypertension: Secondary | ICD-10-CM | POA: Diagnosis not present

## 2022-04-04 DIAGNOSIS — R079 Chest pain, unspecified: Secondary | ICD-10-CM | POA: Diagnosis not present

## 2022-04-04 DIAGNOSIS — S59912A Unspecified injury of left forearm, initial encounter: Secondary | ICD-10-CM | POA: Diagnosis present

## 2022-04-04 DIAGNOSIS — Z471 Aftercare following joint replacement surgery: Secondary | ICD-10-CM | POA: Diagnosis not present

## 2022-04-04 MED ORDER — BACITRACIN ZINC 500 UNIT/GM EX OINT
TOPICAL_OINTMENT | Freq: Two times a day (BID) | CUTANEOUS | Status: DC
  Filled 2022-04-04: qty 2.7

## 2022-04-04 MED ORDER — ONDANSETRON HCL 4 MG/2ML IJ SOLN
4.0000 mg | Freq: Once | INTRAMUSCULAR | Status: AC
Start: 2022-04-04 — End: 2022-04-04
  Administered 2022-04-04: 4 mg via INTRAVENOUS
  Filled 2022-04-04: qty 2

## 2022-04-04 MED ORDER — MORPHINE SULFATE (PF) 4 MG/ML IV SOLN
4.0000 mg | Freq: Once | INTRAVENOUS | Status: AC
  Administered 2022-04-04: 4 mg via INTRAVENOUS
  Filled 2022-04-04: qty 1

## 2022-04-04 MED ORDER — HYDROCODONE-ACETAMINOPHEN 5-325 MG PO TABS: 1.0000 | ORAL_TABLET | ORAL | 0 refills | Status: DC | PRN

## 2022-04-04 NOTE — ED Triage Notes (Signed)
Pt to ER via EMS from home with c/o tripping and falling several hours ago.  Pt notes pain to left forearm and shoulder.  Pt denies hitting head, denies dizziness or LOC, denies other injury.

## 2022-04-04 NOTE — ED Provider Notes (Signed)
[facility] Provider Note   CSN: [id number] Arrival date & time: 04/04/22  1600     History  Chief Complaint  Patient presents with   Fall   Arm Injury    Rebecca [name] is a 67 y.o. female.  Pt is a 67 yo female with pmhx significant for asthma, gerd, reflux, and chronic pain.  Pt said her croc shoe got caught on a rug and she tripped and fell.  She landed on her left side.  She denies hitting her head or having a loc.  She was unable to get up.  She eventually crawled to the phone and called EMS.  She has left shoulder and left forearm pain and left knee pain.  She is not on blood thinners.       Home Medications Prior to Admission medications   Medication Sig Start Date End Date Taking? Authorizing Provider  albuterol (PROVENTIL HFA;VENTOLIN HFA) 108 (90 BASE) MCG/ACT inhaler Inhale 2 puffs into the lungs every 4 (four) hours as needed for wheezing or shortness of breath. 09/08/13  Yes Lindell Spar I, NP  albuterol (PROVENTIL) (2.5 MG/3ML) 0.083% nebulizer solution Take 2.5 mg by nebulization every 4 (four) hours as needed for wheezing or shortness of breath.   Yes [provider]  Budeson-Glycopyrrol-Formoterol (BREZTRI AEROSPHERE) 160-9-4.8 MCG/ACT AERO Inhale 2 puffs into the lungs in the morning and at bedtime. 07/12/21  Yes Tanda Rockers, MD  buPROPion (WELLBUTRIN XL) 300 MG 24 hr tablet Take 1 tablet (300 mg total) by mouth every morning. 03/18/22 03/18/23 Yes Cloria Spring, MD  CALCIUM PO Take 1 tablet by mouth daily.   Yes [provider]  FLUoxetine (PROZAC) 40 MG capsule Take 1 capsule (40 mg total) by mouth daily. 03/18/22 03/18/23 Yes Cloria Spring, MD  gabapentin (NEURONTIN) 300 MG capsule Take 1 capsule (300 mg total) by mouth 2 (two) times daily. For agitation/pain managment 03/18/22  Yes Cloria Spring, MD  HYDROcodone-acetaminophen (NORCO/VICODIN) 5-325 MG tablet Take 1 tablet by mouth every 4 (four)  hours as needed. 04/04/22  Yes Isla Pence, MD  meloxicam (MOBIC) 15 MG tablet Take 15 mg by mouth every morning. 07/23/21  Yes [provider]  pantoprazole (PROTONIX) 40 MG tablet Take 1 tablet (40 mg total) by mouth daily. For acid reflux 09/08/13  Yes Nwoko, Herbert Pun I, NP  pramipexole (MIRAPEX) 0.25 MG tablet Take 0.25 mg by mouth daily. 01/21/22  Yes [provider]  clonazePAM (KLONOPIN) 1 MG tablet Take 1 tablet (1 mg total) by mouth at bedtime. Patient not taking: Reported on 04/04/2022 03/18/22   Cloria Spring, MD      Allergies    Other and Sulfa antibiotics    Review of Systems   Review of Systems  Musculoskeletal:        Left shoulder, forearm, elbow, and knee pain  All other systems reviewed and are negative.   Physical Exam Updated Vital Signs BP (!) 144/77   Pulse 74   Temp 98 F (36.7 C) (Oral)   Resp 18   Ht '5\' 3"'$  (1.6 m)   Wt 127 kg   SpO2 97%   BMI 49.60 kg/m  Physical Exam Vitals and nursing note reviewed.  Constitutional:      Appearance: Normal appearance. She is obese.  HENT:     Head: Normocephalic and atraumatic.     Right Ear: External ear normal.     Left Ear:  External ear normal.     Nose: Nose normal.     Mouth/Throat:     Mouth: Mucous membranes are moist.     Pharynx: Oropharynx is clear.  Eyes:     Extraocular Movements: Extraocular movements intact.     Conjunctiva/sclera: Conjunctivae normal.     Pupils: Pupils are equal, round, and reactive to light.  Cardiovascular:     Rate and Rhythm: Normal rate and regular rhythm.     Pulses: Normal pulses.     Heart sounds: Normal heart sounds.  Pulmonary:     Effort: Pulmonary effort is normal.     Breath sounds: Normal breath sounds.  Abdominal:     General: Abdomen is flat. Bowel sounds are normal.     Palpations: Abdomen is soft.  Musculoskeletal:       Arms:     Cervical back: Normal range of motion and neck supple.       Legs:  Skin:    General: Skin is warm.      Capillary Refill: Capillary refill takes less than 2 seconds.  Neurological:     General: No focal deficit present.     Mental Status: She is alert and oriented to person, place, and time.  Psychiatric:        Mood and Affect: Mood normal.        Behavior: Behavior normal.     ED Results / Procedures / Treatments   Labs (all labs ordered are listed, but only abnormal results are displayed) Labs Reviewed - No data to display  EKG None  Radiology DG Forearm Left  Result Date: 04/04/2022 CLINICAL DATA:  Fall, pain EXAM: LEFT FOREARM - 2 VIEW COMPARISON:  None Available. FINDINGS: There is no evidence of fracture or other focal bone lesions. Soft tissue edema about the dorsal forearm. IMPRESSION: No fracture or dislocation of the left forearm. Soft tissue edema about the dorsal forearm. Electronically Signed   By: Delanna Ahmadi M.D.   On: 04/04/2022 17:09   DG Chest 2 View  Result Date: 04/04/2022 CLINICAL DATA:  Pain after fall EXAM: CHEST - 2 VIEW COMPARISON:  Chest x-ray 10/11/2019.  CT 06/18/2021 FINDINGS: No consolidation, pneumothorax or effusion. Normal cardiopericardial silhouette without edema. Degenerative changes of the spine. IMPRESSION: No acute cardiopulmonary disease Electronically Signed   By: Jill Side M.D.   On: 04/04/2022 17:07   DG Pelvis 1-2 Views  Result Date: 04/04/2022 CLINICAL DATA:  Fall EXAM: PELVIS - 1-2 VIEW COMPARISON:  None Available. FINDINGS: There is no evidence of pelvic fracture or diastasis. No pelvic bone lesions are seen. IMPRESSION: Negative. Electronically Signed   By: Ronney Asters M.D.   On: 04/04/2022 17:07   DG Elbow Complete Left  Result Date: 04/04/2022 CLINICAL DATA:  Pain after fall EXAM: LEFT ELBOW - COMPLETE 4 VIEW COMPARISON:  None Available. FINDINGS: There is no evidence of fracture, dislocation, or joint effusion. There is no evidence of arthropathy or other focal bone abnormality. Soft tissues are unremarkable. IMPRESSION: No  acute osseous abnormality Electronically Signed   By: Jill Side M.D.   On: 04/04/2022 17:06   DG Shoulder Left  Result Date: 04/04/2022 CLINICAL DATA:  Pain after fall EXAM: LEFT SHOULDER - 3 VIEW COMPARISON:  None Available. FINDINGS: There is no evidence of fracture or dislocation. There is no evidence of arthropathy or other focal bone abnormality. Soft tissues are unremarkable. IMPRESSION: No acute osseous abnormality Electronically Signed   By: Larose Hires.D.  On: 04/04/2022 17:05   DG Knee Complete 4 Views Left  Result Date: 04/04/2022 CLINICAL DATA:  Fall, pain EXAM: LEFT KNEE - COMPLETE 4+ VIEW COMPARISON:  None Available. FINDINGS: Status post left knee total arthroplasty. No evidence of perihardware fracture or component malpositioning. Soft tissue edema about the anterior and lateral knee. IMPRESSION: 1. Status post left knee total arthroplasty. No evidence of perihardware fracture or component malpositioning. 2. Soft tissue edema about the anterior and lateral knee. Electronically Signed   By: Delanna Ahmadi M.D.   On: 04/04/2022 17:05    Procedures Procedures    Medications Ordered in ED Medications  bacitracin ointment (has no administration in time range)  morphine (PF) 4 MG/ML injection 4 mg (4 mg Intravenous Given 04/04/22 1710)  ondansetron (ZOFRAN) injection 4 mg (4 mg Intravenous Given 04/04/22 1711)    ED Course/ Medical Decision Making/ A&P                             Medical Decision Making Amount and/or Complexity of Data Reviewed Radiology: ordered.  Risk OTC drugs. Prescription drug management.   This patient presents to the ED for concern of fall, this involves an extensive number of treatment options, and is a complaint that carries with it a high risk of complications and morbidity.  The differential diagnosis includes multiple trauma   Co morbidities that complicate the patient evaluation  asthma, gerd, reflux, and chronic pain   Additional  history obtained:  Additional history obtained from epic chart review External records from outside source obtained and reviewed including EMS report  Imaging Studies ordered:  I ordered imaging studies including cxr, left forearm, pelvis, left elbow, left shoulder, left knee  I independently visualized and interpreted imaging which showed  L forearm: No fracture or dislocation of the left forearm. Soft tissue edema  about the dorsal forearm.  CXR: No acute cardiopulmonary disease  Pelvis: Negative.  L elbow: No acute osseous abnormality  L shoulder: No acute osseous abnormality  L knee: . Status post left knee total arthroplasty. No evidence of  perihardware fracture or component malpositioning.  2. Soft tissue edema about the anterior and lateral knee.   I agree with the radiologist interpretation   Cardiac Monitoring:  The patient was maintained on a cardiac monitor.  I personally viewed and interpreted the cardiac monitored which showed an underlying rhythm of: nsr   Medicines ordered and prescription drug management:  I ordered medication including morphine and zofran  for sx  Reevaluation of the patient after these medicines showed that the patient improved I have reviewed the patients home medicines and have made adjustments as needed   Test Considered: X-rays    Critical Interventions:  Pain control    Problem List / ED Course:  Fall:  no fx.  Pt has multiple contusions and abrasions.  She is able to ambulate without any problems.  She is stable for d/c.  Return if worse.  F/u with pcp.   Reevaluation:  After the interventions noted above, I reevaluated the patient and found that they have :improved   Social Determinants of Health:  Lives at home   Dispostion:  After consideration of the diagnostic results and the patients response to treatment, I feel that the patent would benefit from discharge with outpatient f/u.          Final  Clinical Impression(s) / ED Diagnoses Final diagnoses:  Fall, initial encounter  Contusion of left shoulder, initial encounter  Contusion of left forearm, initial encounter  Contusion of left knee, initial encounter    Rx / DC Orders ED Discharge Orders          Ordered    HYDROcodone-acetaminophen (NORCO/VICODIN) 5-325 MG tablet  Every 4 hours PRN        04/04/22 1837              Isla Pence, MD 04/04/22 Bosie Helper

## 2022-04-09 ENCOUNTER — Telehealth: Payer: Self-pay | Admitting: *Deleted

## 2022-04-09 NOTE — Telephone Encounter (Signed)
        Patient  visited [facility] on 04/04/2022  for treatment   Telephone encounter attempt :  1st  A HIPAA compliant voice message was left requesting a return call.  Instructed patient to call back at [phone].  [place] [phone] [address] , [place] [address] Email : [email or web address]

## 2022-04-21 ENCOUNTER — Other Ambulatory Visit: Payer: Self-pay

## 2022-04-21 MED ORDER — BREZTRI AEROSPHERE 160-9-4.8 MCG/ACT IN AERO: 2.0000 | INHALATION_SPRAY | Freq: Two times a day (BID) | RESPIRATORY_TRACT | 3 refills | Status: DC

## 2022-04-23 ENCOUNTER — Other Ambulatory Visit (HOSPITAL_COMMUNITY): Payer: Self-pay | Admitting: Family Medicine

## 2022-04-23 ENCOUNTER — Other Ambulatory Visit (HOSPITAL_COMMUNITY): Payer: Self-pay | Admitting: *Deleted

## 2022-04-23 ENCOUNTER — Ambulatory Visit (HOSPITAL_COMMUNITY)
Admission: RE | Admit: 2022-04-23 | Discharge: 2022-04-23 | Disposition: A | Discharge status: Home / Self Care | Payer: PPO | Source: Ambulatory Visit | Attending: Family Medicine | Admitting: Family Medicine

## 2022-04-23 ENCOUNTER — Encounter (HOSPITAL_COMMUNITY): Payer: Self-pay

## 2022-04-23 DIAGNOSIS — Z1231 Encounter for screening mammogram for malignant neoplasm of breast: Secondary | ICD-10-CM

## 2022-05-01 ENCOUNTER — Other Ambulatory Visit: Payer: Self-pay | Admitting: Acute Care

## 2022-05-01 DIAGNOSIS — Z87891 Personal history of nicotine dependence: Secondary | ICD-10-CM

## 2022-05-01 DIAGNOSIS — Z122 Encounter for screening for malignant neoplasm of respiratory organs: Secondary | ICD-10-CM

## 2022-06-25 ENCOUNTER — Ambulatory Visit (HOSPITAL_COMMUNITY): Admission: RE | Admit: 2022-06-25 | Payer: PPO | Source: Ambulatory Visit

## 2022-08-15 ENCOUNTER — Ambulatory Visit (HOSPITAL_COMMUNITY)
Admission: RE | Admit: 2022-08-15 | Discharge: 2022-08-15 | Disposition: A | Discharge status: Home / Self Care | Payer: PPO | Source: Ambulatory Visit | Attending: Acute Care | Admitting: Acute Care

## 2022-08-15 DIAGNOSIS — Z122 Encounter for screening for malignant neoplasm of respiratory organs: Secondary | ICD-10-CM | POA: Insufficient documentation

## 2022-08-15 DIAGNOSIS — Z87891 Personal history of nicotine dependence: Secondary | ICD-10-CM | POA: Diagnosis not present

## 2022-08-18 ENCOUNTER — Other Ambulatory Visit: Payer: Self-pay | Admitting: Acute Care

## 2022-08-18 DIAGNOSIS — Z122 Encounter for screening for malignant neoplasm of respiratory organs: Secondary | ICD-10-CM

## 2022-08-18 DIAGNOSIS — Z87891 Personal history of nicotine dependence: Secondary | ICD-10-CM

## 2022-08-25 ENCOUNTER — Encounter: Payer: Self-pay | Admitting: Internal Medicine

## 2022-08-31 NOTE — Progress Notes (Unsigned)
Rebecca [name], female    DOB: [birth year],   MRN: [id number]   Brief patient profile:  39 yowf quit smoking 2021  RN with onset early twenties and was doing fine on advair 250 until Dec 2020 and no better on 500 with cough/wheeze/ sob no better then changed to symicort 160 which seemed to help for a weeks then symptoms resumed so referred to pulmonary clinic in [place]  10/11/2019 by Dr   Lenise Arena    Baseline wt 242 summer 07/2017   History of Present Illness  10/11/2019  Pulmonary/ 1st office eval/Lorenna Lurry  Chief Complaint  Patient presents with   Pulmonary Consult    Referred by Dr. Silvestre Moment.  Pt c/o cough with clear sputum in the am's and wheezing x 6 months. She also c/o SOB "just about all the time"- worse with exertion.   Dyspnea:  HC parking due to arthritis back and L knee one aisle at grocery  Cough: more in am's > thick white  Sleep: bed is flat/ R side  SABA use: ?  Improves p saba - never rechallenges rec Plan A = Automatic = Always=    Breztri Take 2 puffs first thing in am and then another 2 puffs about 12 hours later.  Plan B = Backup (to supplement plan A, not to replace it) Only use your albuterol inhaler as a rescue medication Plan C = Crisis (instead of Plan B but only if Plan B stops working) - only use your albuterol nebulizer if you first try Plan B  Also:  Try albuterol 15 min before an activity that you know would make you short of breath and see if it makes any difference and if makes none then don't take it after activity unless you can't catch your breath.  Try to quit smoking if at all possible    11/24/2019  f/u ov/[place] office/Willene Holian re: AB/still smoking with ERV 22% Chief Complaint  Patient presents with   Follow-up    Breathing has improved since the last visit. She is using her albuterol inhaler inhaler 3 x per wk and has not used neb.    Dyspnea:  Climbing on yard equipment, mb and back easier  Cough: none  Sleeping: flat bed one pillow R  side fine  SABA use: much less 02: not using  Rec The key is to stop smoking completely before smoking completely stops you! Weight control is simply a matter of calorie balance      09/02/2021  f/u ov/[place] office/Kimiah Hibner re: AB maint on Breztri   Chief Complaint  Patient presents with   Follow-up    Breztri working well.   Dyspnea:  walking to [employer] 50 uphill s stopping / garage another 25 ft overall easier on brezti  Cough: none Sleeping: on R Side down / bed is flat  3 pillows SABA use: neb 4 x weekly / no hfa  02: none  Covid status: vax up to date  Rec Plan A = Automatic = Always=    Breztri Take 2 puffs first thing in am and then another 2 puffs about 12 hours later.  Work on inhaler technique:     Plan B = Backup (to supplement plan A, not to replace it) Only use your albuterol inhaler as a rescue medication Plan C = Crisis (instead of Plan B but only if Plan B stops working) - only use your albuterol nebulizer if you first try Plan B Ok to try albuterol 15  min before an activity (on alternating days)  that you know would usually make you short of breath      09/01/2022  12 m f/u ov/[place] office/Oanh Devivo re: AB/MO maint on Breztri 2bid Chief Complaint  Patient presents with   Asthmatic bronchitis, mild persistent, uncomplicated  Dyspnea:  no change -  considering joining Y  Cough: none  Sleeping: bed is flat only one pillow under head  SABA use: rarely  02: none     No obvious day to day or daytime variability or assoc excess/ purulent sputum or mucus plugs or hemoptysis or cp or chest tightness, subjective wheeze or overt sinus or hb symptoms.   Sleeping  without nocturnal  or early am exacerbation  of respiratory  c/o's or need for noct saba. Also denies any obvious fluctuation of symptoms with weather or environmental changes or other aggravating or alleviating factors except as outlined above   No unusual exposure hx or h/o childhood pna/ asthma or  knowledge of premature birth.  Current Allergies, Complete Past Medical History, Past Surgical History, Family History, and Social History were reviewed in [employer] record.  ROS  The following are not active complaints unless bolded Hoarseness, sore throat, dysphagia, dental problems, itching, sneezing,  nasal congestion or discharge of excess mucus or purulent secretions, ear ache,   fever, chills, sweats, unintended wt loss or wt gain, classically pleuritic or exertional cp,  orthopnea pnd or arm/hand swelling  or leg swelling, presyncope, palpitations, abdominal pain, anorexia, nausea, vomiting, diarrhea  or change in bowel habits or change in bladder habits, change in stools or change in urine, dysuria, hematuria,  rash, arthralgias, visual complaints, headache, numbness, weakness or ataxia or problems with walking or coordination (R leg weak) ,  change in mood or  memory.        Current Meds  Medication Sig   albuterol (PROVENTIL HFA;VENTOLIN HFA) 108 (90 BASE) MCG/ACT inhaler Inhale 2 puffs into the lungs every 4 (four) hours as needed for wheezing or shortness of breath.   albuterol (PROVENTIL) (2.5 MG/3ML) 0.083% nebulizer solution Take 2.5 mg by nebulization every 4 (four) hours as needed for wheezing or shortness of breath.   Budeson-Glycopyrrol-Formoterol (BREZTRI AEROSPHERE) 160-9-4.8 MCG/ACT AERO Inhale 2 puffs into the lungs in the morning and at bedtime.   buPROPion (WELLBUTRIN XL) 300 MG 24 hr tablet Take 1 tablet (300 mg total) by mouth every morning.   CALCIUM PO Take 1 tablet by mouth daily.   FLUoxetine (PROZAC) 40 MG capsule Take 1 capsule (40 mg total) by mouth daily.   gabapentin (NEURONTIN) 300 MG capsule Take 1 capsule (300 mg total) by mouth 2 (two) times daily. For agitation/pain managment   meloxicam (MOBIC) 15 MG tablet Take 15 mg by mouth every morning.   pantoprazole (PROTONIX) 40 MG tablet Take 1 tablet (40 mg total) by mouth daily. For acid  reflux   pramipexole (MIRAPEX) 0.25 MG tablet Take 0.25 mg by mouth daily.                Objective:    Wts  09/01/2022       291  09/02/2021       273   11/24/19 261 lb (118.4 kg)  10/11/19 263 lb (119.3 kg)  07/05/19 259 lb (117.5 kg)      Vital signs reviewed  09/01/2022  - Note at rest 02 sats  93% on RA   General appearance:  MO (by bmi)  amb  wf nad      HEENT : Oropharynx  clear         NECK :  without  apparent JVD/ palpable Nodes/TM    LUNGS: no acc muscle use,  Nl contour chest which is clear to A and P bilaterally without cough on insp or exp maneuvers   CV:  RRR  no s3 or murmur or increase in P2, and no edema   ABD: obese  soft and nontender    MS:  Nl gait/ ext warm without deformities Or obvious joint restrictions  calf tenderness, cyanosis or clubbing    SKIN: warm and dry without lesions    NEURO:  alert, approp, nl sensorium with  no motor or cerebellar deficits apparent.        I personally reviewed images and agree with radiology impression as follows:   Chest LDSCT     08/15/22    1. Lung-RADS 2, benign appearance or behavior. Continue annual screening with low-dose chest CT without contrast in 12 months. 2. Mild diffuse bronchial wall thickening with mild centrilobular and paraseptal emphysema; imaging findings suggestive of underlying COPD.        Assessment

## 2022-09-01 ENCOUNTER — Ambulatory Visit: Payer: PPO | Admitting: Internal Medicine

## 2022-09-01 ENCOUNTER — Encounter: Payer: Self-pay | Admitting: Internal Medicine

## 2022-09-01 VITALS — BP 141/83 | HR 75 | Ht 63.0 in | Wt 291.0 lb

## 2022-09-01 DIAGNOSIS — J4530 Mild persistent asthma, uncomplicated: Secondary | ICD-10-CM

## 2022-09-01 DIAGNOSIS — Z87891 Personal history of nicotine dependence: Secondary | ICD-10-CM

## 2022-09-01 NOTE — Assessment & Plan Note (Signed)
Quit smoking 2021  Low-dose CT lung cancer screening is recommended for patients who are 14-66 years of age with a 20+ pack-year history of smoking and who are currently smoking or quit <=15 years ago. No coughing up blood  No unintentional weight loss of > 15 pounds in the last 6 months - pt is eligible for scanning yearly until 2036  Discussed in detail all the  indications, usual  risks and alternatives  relative to the benefits with patient who agrees to proceed with w/u as outlined.     F/u yearly in clinic, sooner if needed         Each maintenance medication was reviewed in detail including emphasizing most importantly the difference between maintenance and prns and under what circumstances the prns are to be triggered using an action plan format where appropriate.  Total time for H and P, chart review, counseling, reviewing hfa  device(s) and generating customized AVS unique to this office visit / same day charting = 30 min yearly f/u

## 2022-09-01 NOTE — Assessment & Plan Note (Addendum)
Quit smoking 2021  - 10/11/2019  After extensive coaching inhaler device,  effectiveness =    75% (short Ti)  > try breztri 2bid  - Allergy profile 10/11/2019 >  Eos 0.5 /  IgE  121 > 10/13/2019 rec add singulair  - alpha one AT  10/11/19   MM   158  - PFT's  11/07/2019    FEV1 1.69 (68 % ) ratio 0.79  p 29 % improvement from saba p 0 prior to study with DLCO  19.99 (94%) corrects to 4.23 (126%)  for alv volume and FV curve min concave  And ERV 22%  > see AB  - LDSCT  06/18/21 mild  emphysema  She is clearly more AB than copd so ok to decrease breztri to 2 each am with pm doses prn as long as no noct symptoms and no increased  need for saba.

## 2022-09-01 NOTE — Assessment & Plan Note (Addendum)
ERV 22% by PFTs  11/07/19    -  Body mass index is 51.55 kg/m.  -  trending up  Lab Results  Component Value Date   TSH 2.286 10/11/2019      Contributing to doe arthritis issues and risk of GERD/dvt/PE >>>   reviewed the need and the process to achieve and maintain neg calorie balance > defer f/u primary care including intermittently monitoring thyroid status

## 2022-09-01 NOTE — Patient Instructions (Addendum)
Water aerobics really good choice for you   Ok to leave off pm breztri if doing great with night time symptoms and minimal need for albuterol   Please schedule a follow up visit in 12  months but call sooner if needed

## 2022-09-15 ENCOUNTER — Other Ambulatory Visit: Payer: Self-pay | Admitting: Psychiatry

## 2022-09-15 ENCOUNTER — Other Ambulatory Visit (HOSPITAL_COMMUNITY): Payer: Self-pay | Admitting: Psychiatry

## 2022-10-14 ENCOUNTER — Encounter: Payer: Self-pay | Admitting: Family Medicine

## 2022-10-14 NOTE — Telephone Encounter (Signed)
Patient called/messaged wanting to check to see if we had received records from [facility]. Spoke with patient about the phone situation as well.

## 2022-10-29 DIAGNOSIS — U071 COVID-19: Secondary | ICD-10-CM | POA: Diagnosis not present

## 2022-10-29 DIAGNOSIS — Z6841 Body Mass Index (BMI) 40.0 and over, adult: Secondary | ICD-10-CM | POA: Diagnosis not present

## 2022-11-06 ENCOUNTER — Encounter: Payer: Self-pay | Admitting: Internal Medicine

## 2022-11-06 ENCOUNTER — Other Ambulatory Visit: Payer: Self-pay

## 2022-11-06 MED ORDER — BREZTRI AEROSPHERE 160-9-4.8 MCG/ACT IN AERO: 2.0000 | INHALATION_SPRAY | Freq: Two times a day (BID) | RESPIRATORY_TRACT | 3 refills | Status: DC

## 2022-11-13 ENCOUNTER — Ambulatory Visit: Payer: PPO | Admitting: Family Medicine

## 2022-12-03 NOTE — Patient Instructions (Signed)
        Great to see you today.  I have refilled the medication(s) we provide.   Osteoarthritis: Age and obesity are significant risk factors for osteoarthritis. The increased weight places extra strain on the joints, especially in weight-bearing areas like the knees, hips, and lower back, accelerating cartilage breakdown.     If labs were collected, we will inform you of lab results once received either by echart message or telephone call.   - echart message- for normal results that have been seen by the patient already.   - telephone call: abnormal results or if patient has not viewed results in their echart.   - Please take medications as prescribed. - Follow up with your primary health provider if any health concerns arises. - If symptoms worsen please contact your primary care provider and/or visit the emergency department.

## 2022-12-03 NOTE — Progress Notes (Unsigned)
   New Patient Office Visit   Subjective   Patient ID: Rebecca [name], female    DOB: 1955/02/26  Age: 67 y.o. MRN: [id number]  CC: No chief complaint on file.   HPI Rebecca [name] 67 year old female, presents to establish care. She  has a past medical history of Asthma, Chronic pain (began in 2011 after jooint replacements), Depression, and Reflux.  HPI    Outpatient Encounter Medications as of 12/04/2022  Medication Sig   albuterol (PROVENTIL HFA;VENTOLIN HFA) 108 (90 BASE) MCG/ACT inhaler Inhale 2 puffs into the lungs every 4 (four) hours as needed for wheezing or shortness of breath.   albuterol (PROVENTIL) (2.5 MG/3ML) 0.083% nebulizer solution Take 2.5 mg by nebulization every 4 (four) hours as needed for wheezing or shortness of breath.   Budeson-Glycopyrrol-Formoterol (BREZTRI AEROSPHERE) 160-9-4.8 MCG/ACT AERO Inhale 2 puffs into the lungs in the morning and at bedtime.   buPROPion (WELLBUTRIN XL) 300 MG 24 hr tablet Take 1 tablet (300 mg total) by mouth every morning.   CALCIUM PO Take 1 tablet by mouth daily.   clonazePAM (KLONOPIN) 1 MG tablet Take 1 tablet (1 mg total) by mouth at bedtime. (Patient not taking: Reported on 04/04/2022)   FLUoxetine (PROZAC) 40 MG capsule Take 1 capsule (40 mg total) by mouth daily.   gabapentin (NEURONTIN) 300 MG capsule Take 1 capsule (300 mg total) by mouth 2 (two) times daily. For agitation/pain managment   meloxicam (MOBIC) 15 MG tablet Take 15 mg by mouth every morning.   pantoprazole (PROTONIX) 40 MG tablet Take 1 tablet (40 mg total) by mouth daily. For acid reflux   pramipexole (MIRAPEX) 0.25 MG tablet Take 0.25 mg by mouth daily.   No facility-administered encounter medications on file as of 12/04/2022.    Past Surgical History:  Procedure Laterality Date   ABDOMINAL HYSTERECTOMY     BREAST CYST EXCISION Left    FOOT SURGERY  2011 -both feet   KNEE SURGERY     left knee   SHOULDER SURGERY  2011    ROS    Objective     There were no vitals taken for this visit.  Physical Exam    Assessment & Plan:  There are no diagnoses linked to this encounter.  No follow-ups on file.   Cruzita Lederer Newman Nip, FNP

## 2022-12-04 ENCOUNTER — Encounter: Payer: Self-pay | Admitting: Family Medicine

## 2022-12-04 ENCOUNTER — Ambulatory Visit (INDEPENDENT_AMBULATORY_CARE_PROVIDER_SITE_OTHER): Payer: PPO | Admitting: Family Medicine

## 2022-12-04 VITALS — BP 139/84 | HR 75 | Ht 63.0 in | Wt 287.1 lb

## 2022-12-04 DIAGNOSIS — M25561 Pain in right knee: Secondary | ICD-10-CM

## 2022-12-04 DIAGNOSIS — R7301 Impaired fasting glucose: Secondary | ICD-10-CM

## 2022-12-04 DIAGNOSIS — Z136 Encounter for screening for cardiovascular disorders: Secondary | ICD-10-CM

## 2022-12-04 DIAGNOSIS — R0602 Shortness of breath: Secondary | ICD-10-CM | POA: Diagnosis not present

## 2022-12-04 DIAGNOSIS — Z1159 Encounter for screening for other viral diseases: Secondary | ICD-10-CM

## 2022-12-04 DIAGNOSIS — E559 Vitamin D deficiency, unspecified: Secondary | ICD-10-CM | POA: Diagnosis not present

## 2022-12-04 DIAGNOSIS — M25562 Pain in left knee: Secondary | ICD-10-CM

## 2022-12-04 DIAGNOSIS — E538 Deficiency of other specified B group vitamins: Secondary | ICD-10-CM

## 2022-12-04 DIAGNOSIS — G8929 Other chronic pain: Secondary | ICD-10-CM

## 2022-12-04 DIAGNOSIS — E038 Other specified hypothyroidism: Secondary | ICD-10-CM

## 2022-12-04 DIAGNOSIS — Z23 Encounter for immunization: Secondary | ICD-10-CM

## 2022-12-04 MED ORDER — METHYLPREDNISOLONE ACETATE 40 MG/ML IJ SUSP: 40.0000 mg | Freq: Once | INTRAMUSCULAR | Status: DC

## 2022-12-04 MED ORDER — METHYLPREDNISOLONE ACETATE 80 MG/ML IJ SUSP
80.0000 mg | Freq: Once | INTRAMUSCULAR | Status: AC
  Administered 2022-12-04: 80 mg via INTRAMUSCULAR

## 2022-12-04 NOTE — Progress Notes (Signed)
Pt. Received injection without complications

## 2022-12-04 NOTE — Assessment & Plan Note (Addendum)
Trial on Tylenol arthritis  Depo-Medrol IM injection given today   I explained to the patient that non-pharmacological interventions include  reducing stress on the joints by maintaining an appropriate weight- exercise routine staring  with 30 mins - 5 times per week, application of ice or heat, rest, and recommended range of motion exercises along with gentle stretching. For pain management, Tylenol was advised. The patient was instructed to follow up if symptoms worsen or persist. The patient verbalized understanding of the care plan, and all questions were answered.  Referral can be placed to physical therapy if interested.

## 2022-12-17 DIAGNOSIS — E038 Other specified hypothyroidism: Secondary | ICD-10-CM | POA: Diagnosis not present

## 2022-12-17 DIAGNOSIS — R7301 Impaired fasting glucose: Secondary | ICD-10-CM | POA: Diagnosis not present

## 2022-12-17 DIAGNOSIS — Z136 Encounter for screening for cardiovascular disorders: Secondary | ICD-10-CM | POA: Diagnosis not present

## 2022-12-17 DIAGNOSIS — E559 Vitamin D deficiency, unspecified: Secondary | ICD-10-CM | POA: Diagnosis not present

## 2022-12-17 DIAGNOSIS — Z1159 Encounter for screening for other viral diseases: Secondary | ICD-10-CM | POA: Diagnosis not present

## 2022-12-17 DIAGNOSIS — E538 Deficiency of other specified B group vitamins: Secondary | ICD-10-CM | POA: Diagnosis not present

## 2022-12-19 LAB — CBC WITH DIFFERENTIAL/PLATELET
Basophils Absolute: 0.2 10*3/uL (ref 0.0–0.2)
Basos: 2 %
EOS (ABSOLUTE): 0.6 10*3/uL — ABNORMAL HIGH (ref 0.0–0.4)
Eos: 6 %
Hematocrit: 38.6 % (ref 34.0–46.6)
Hemoglobin: 12.3 g/dL (ref 11.1–15.9)
Immature Grans (Abs): 0.1 10*3/uL (ref 0.0–0.1)
Immature Granulocytes: 1 %
Lymphocytes Absolute: 2 10*3/uL (ref 0.7–3.1)
Lymphs: 20 %
MCH: 27.8 pg (ref 26.6–33.0)
MCHC: 31.9 g/dL (ref 31.5–35.7)
MCV: 87 fL (ref 79–97)
Monocytes Absolute: 0.5 10*3/uL (ref 0.1–0.9)
Monocytes: 5 %
Neutrophils Absolute: 6.8 10*3/uL (ref 1.4–7.0)
Neutrophils: 66 %
Platelets: 494 10*3/uL — ABNORMAL HIGH (ref 150–450)
RBC: 4.42 x10E6/uL (ref 3.77–5.28)
RDW: 14.7 % (ref 11.7–15.4)
WBC: 10.2 10*3/uL (ref 3.4–10.8)

## 2022-12-19 LAB — LIPID PANEL
Chol/HDL Ratio: 2.8 ratio (ref 0.0–4.4)
Cholesterol, Total: 256 mg/dL — ABNORMAL HIGH (ref 100–199)
HDL: 93 mg/dL (ref >39)
LDL Chol Calc (NIH): 150 mg/dL — ABNORMAL HIGH (ref 0–99)
Triglycerides: 81 mg/dL (ref 0–149)
VLDL Cholesterol Cal: 13 mg/dL (ref 5–40)

## 2022-12-19 LAB — CMP14+EGFR
ALT: 16 [IU]/L (ref 0–32)
AST: 17 [IU]/L (ref 0–40)
Albumin: 4.1 g/dL (ref 3.9–4.9)
Alkaline Phosphatase: 118 [IU]/L (ref 44–121)
BUN/Creatinine Ratio: 17 (ref 12–28)
BUN: 15 mg/dL (ref 8–27)
Bilirubin Total: 0.3 mg/dL (ref 0.0–1.2)
CO2: 20 mmol/L (ref 20–29)
Calcium: 9.1 mg/dL (ref 8.7–10.3)
Chloride: 102 mmol/L (ref 96–106)
Creatinine, Ser: 0.9 mg/dL (ref 0.57–1.00)
Globulin, Total: 2.3 g/dL (ref 1.5–4.5)
Glucose: 105 mg/dL — ABNORMAL HIGH (ref 70–99)
Potassium: 4.7 mmol/L (ref 3.5–5.2)
Sodium: 141 mmol/L (ref 134–144)
Total Protein: 6.4 g/dL (ref 6.0–8.5)
eGFR: 71 mL/min/{1.73_m2} (ref >59)

## 2022-12-19 LAB — TSH+FREE T4
Free T4: 1.14 ng/dL (ref 0.82–1.77)
TSH: 1.68 u[IU]/mL (ref 0.450–4.500)

## 2022-12-19 LAB — VITAMIN B12: Vitamin B-12: 228 pg/mL — ABNORMAL LOW (ref 232–1245)

## 2022-12-19 LAB — HEPATITIS C ANTIBODY: Hep C Virus Ab: NONREACTIVE

## 2022-12-19 LAB — BRAIN NATRIURETIC PEPTIDE: BNP: 25.2 pg/mL (ref 0.0–100.0)

## 2022-12-19 LAB — MICROALBUMIN / CREATININE URINE RATIO
Creatinine, Urine: 155.3 mg/dL
Microalb/Creat Ratio: 2 mg/g{creat} (ref 0–29)
Microalbumin, Urine: 3.7 ug/mL

## 2022-12-19 LAB — HEMOGLOBIN A1C
Est. average glucose Bld gHb Est-mCnc: 111 mg/dL
Hgb A1c MFr Bld: 5.5 % (ref 4.8–5.6)

## 2022-12-19 LAB — VITAMIN D 25 HYDROXY (VIT D DEFICIENCY, FRACTURES): Vit D, 25-Hydroxy: 30.1 ng/mL (ref 30.0–100.0)

## 2022-12-19 LAB — RHEUMATOID FACTOR: Rheumatoid fact SerPl-aCnc: <10 [IU]/mL (ref <14.0)

## 2022-12-24 ENCOUNTER — Encounter: Payer: Self-pay | Admitting: Family Medicine

## 2022-12-24 NOTE — Telephone Encounter (Signed)
Pt. Has been informed, she is going to call and schedule when she is available.

## 2022-12-24 NOTE — Telephone Encounter (Signed)
Needs office visit for weight loss management

## 2023-01-06 NOTE — Patient Instructions (Signed)

## 2023-01-06 NOTE — Progress Notes (Unsigned)
   Established Patient Office Visit   Subjective  Patient ID: Rebecca [name], female    DOB: May 05, 1955  Age: 67 y.o. MRN: [id number]  No chief complaint on file.   She  has a past medical history of Allergy, Anxiety, Arthritis, Asthma, Chronic pain (began in 2011 after jooint replacements), Depression, Emphysema of lung (HCC), GERD (gastroesophageal reflux disease), and Reflux.  HPI  ROS    Objective:     There were no vitals taken for this visit. {Vitals History (Optional):23777}  Physical Exam   No results found for any visits on 01/07/23.  The 10-year ASCVD risk score (Arnett DK, et al., 2019) is: 6.7%    Assessment & Plan:  There are no diagnoses linked to this encounter.  No follow-ups on file.   Cruzita Lederer Newman Nip, FNP

## 2023-01-07 ENCOUNTER — Ambulatory Visit: Payer: PPO | Admitting: Family Medicine

## 2023-01-07 ENCOUNTER — Encounter: Payer: Self-pay | Admitting: Family Medicine

## 2023-01-07 VITALS — BP 138/81 | HR 71 | Ht 63.0 in | Wt 292.1 lb

## 2023-01-07 DIAGNOSIS — Z6841 Body Mass Index (BMI) 40.0 and over, adult: Secondary | ICD-10-CM | POA: Diagnosis not present

## 2023-01-07 NOTE — Assessment & Plan Note (Addendum)
With serious comorbidity LDL 150 hyperlipidemia, emphysema , chronic pain due knee pain related to arthritis- status post left knee total arthroplasty  Discussed the importance to start eating 3 meals a day including breakfast, drink 8 glasses of water a day ,reduce portion sizes. reduced carbohydrates limit saturated and trans fat, increase servings of vegetables and limit processed foods. Find an activity that you will enjoy and start to be active at least 5 days a week for 30 minutes each day. Keep a food journal or an activity journal to identify triggers that lead to emotional eating Follow up in 4-12 weeks for Weight loss management   Will consider patient assistance program- will fill out the forms

## 2023-01-08 ENCOUNTER — Encounter: Payer: Self-pay | Admitting: Family Medicine

## 2023-01-09 ENCOUNTER — Encounter: Payer: Self-pay | Admitting: Pharmacist

## 2023-01-09 ENCOUNTER — Telehealth: Payer: Self-pay

## 2023-01-09 ENCOUNTER — Encounter: Payer: Self-pay | Admitting: Family Medicine

## 2023-01-09 NOTE — Telephone Encounter (Signed)
  Please inform her that the pharmacy referral was denied, as they do not provide patient assistance for weight loss medications like Wegovy or Zepbound

## 2023-01-09 NOTE — Telephone Encounter (Unsigned)
Copied from CRM [id number]. Topic: General - Other >> Jan 09, 2023 12:40 PM Alcus Dad H wrote: Reason for CRM: Pt received call from Danella Maiers, [facility] regarding the referral put in for the weight loss patient assistance. Pt stated she think there is a misunderstanding, the patient assistance program is through the manufacturer Wegovy, they are requesting help with completing the application/form.

## 2023-01-10 ENCOUNTER — Encounter: Payer: Self-pay | Admitting: Family Medicine

## 2023-01-12 ENCOUNTER — Other Ambulatory Visit: Payer: Self-pay | Admitting: Family Medicine

## 2023-01-12 MED ORDER — MELOXICAM 15 MG PO TABS: 15.0000 mg | ORAL_TABLET | Freq: Every day | ORAL | 3 refills | Status: DC | PRN

## 2023-01-12 NOTE — Telephone Encounter (Signed)
Where does she want the refill to go?

## 2023-01-12 NOTE — Telephone Encounter (Signed)
sent 

## 2023-01-13 NOTE — Telephone Encounter (Signed)
Thank you :)

## 2023-01-23 NOTE — Telephone Encounter (Signed)
FYI not sure what do with this information

## 2023-01-23 NOTE — Telephone Encounter (Signed)
She wants help getting wt. Loss medication but Merry Proud stated she is not eligible due to her insurance which is why I have not sent it to you previously.

## 2023-01-25 NOTE — Telephone Encounter (Signed)
Okay please let patient know.  Thank you

## 2023-01-26 NOTE — Telephone Encounter (Signed)
Thank you :)

## 2023-01-26 NOTE — Telephone Encounter (Signed)
Yes, she has already been informed. She is looking into other options. But w/out a diabetes diagnoses the medication will not be approved.

## 2023-02-15 ENCOUNTER — Other Ambulatory Visit (HOSPITAL_COMMUNITY): Payer: Self-pay | Admitting: Psychiatry

## 2023-02-16 NOTE — Telephone Encounter (Signed)
 Call for appt

## 2023-02-17 NOTE — Telephone Encounter (Signed)
 Called pt no answer left vm

## 2023-02-18 ENCOUNTER — Other Ambulatory Visit: Payer: Self-pay | Admitting: Family Medicine

## 2023-02-26 ENCOUNTER — Encounter (HOSPITAL_COMMUNITY): Payer: Self-pay | Admitting: Psychiatry

## 2023-02-26 ENCOUNTER — Telehealth (HOSPITAL_COMMUNITY): Payer: PPO | Admitting: Psychiatry

## 2023-02-26 DIAGNOSIS — F3342 Major depressive disorder, recurrent, in full remission: Secondary | ICD-10-CM | POA: Diagnosis not present

## 2023-02-26 MED ORDER — BUPROPION HCL ER (XL) 300 MG PO TB24: 300.0000 mg | ORAL_TABLET | Freq: Every day | ORAL | 2 refills | Status: DC

## 2023-02-26 MED ORDER — FLUOXETINE HCL 40 MG PO CAPS: 40.0000 mg | ORAL_CAPSULE | Freq: Every evening | ORAL | 2 refills | Status: DC

## 2023-02-26 MED ORDER — CLONAZEPAM 1 MG PO TABS: 1.0000 mg | ORAL_TABLET | Freq: Every day | ORAL | 5 refills | Status: DC

## 2023-02-26 MED ORDER — GABAPENTIN 300 MG PO CAPS: 300.0000 mg | ORAL_CAPSULE | Freq: Two times a day (BID) | ORAL | 2 refills | Status: DC

## 2023-02-26 NOTE — Progress Notes (Signed)
Virtual Visit via Video Note  I connected with Rebecca [name] on 02/26/23 at 11:20 AM EST by a video enabled telemedicine application and verified that I am speaking with the correct person using two identifiers.  Location: Patient: home Provider: office   I discussed the limitations of evaluation and management by telemedicine and the availability of in person appointments. The patient expressed understanding and agreed to proceed.    I discussed the assessment and treatment plan with the patient. The patient was provided an opportunity to ask questions and all were answered. The patient agreed with the plan and demonstrated an understanding of the instructions.   The patient was advised to call back or seek an in-person evaluation if the symptoms worsen or if the condition fails to improve as anticipated.  I provided 20 minutes of non-face-to-face time during this encounter.   Diannia Ruder, MD  [facility] MD/PA/NP OP Progress Note  02/26/2023 11:40 AM Rebecca [name]  MRN:  [id number]  Chief Complaint:  Chief Complaint  Patient presents with   Depression   Anxiety   Follow-up   HPI: Patient is a 68 year-old separated white female who lives alone in [place]. 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 She has one daughter and 2 grandchildren  The patient returns for follow-up after about 11 months regarding her depression and anxiety.  She states that she continues to do very well.  She realizes that she is gained quite a bit of weight since COVID and is going to work to get it off through exercise and diet.  She is trying to get on a weight loss medication but unfortunately Medicare will not pay for it.  She states that her daughter is still not talking to her but she has spent a lot of time with extended family and her parents.  She is not living this trouble her.  She denies significant depression anxiety trouble sleeping or thoughts of  self-harm or suicide.  She still feels her medications are working very well.  Visit Diagnosis:    ICD-10-CM   1. Recurrent major depressive disorder, in full remission (HCC)  F33.42       Past Psychiatric History: 2 hospitalizations for depression in2004  and 2015  Past Medical History:  Past Medical History:  Diagnosis Date   Allergy    Anxiety    Arthritis    Asthma    Chronic pain began in 2011 after jooint replacements   Depression    Emphysema of lung (HCC)    GERD (gastroesophageal reflux disease)    Reflux     Past Surgical History:  Procedure Laterality Date   ABDOMINAL HYSTERECTOMY     BREAST CYST EXCISION Left    FOOT SURGERY  2011 -both feet   JOINT REPLACEMENT     KNEE SURGERY     left knee   SHOULDER SURGERY  2011    Family Psychiatric History: See below  Family History:  Family History  Problem Relation Age of Onset   Bipolar disorder Sister    Breast cancer Maternal Aunt    Breast cancer Maternal Grandmother    Alcohol abuse Maternal Grandfather    Breast cancer Cousin    Deep vein thrombosis Neg Hx    Pulmonary embolism Neg Hx    Heart disease Neg Hx    Heart failure Neg Hx     Social History:  Social History   Socioeconomic History  Marital status: Divorced    Spouse name: Not on file   Number of children: 1   Years of education: Not on file   Highest education level: Associate degree: occupational, Scientist, product/process development, or vocational program  Occupational History   Occupation: Retired    Associate Professor: [employer]    Comment: RN  Tobacco Use   Smoking status: Former    Current packs/day: 0.00    Average packs/day: 1 pack/day for 35.0 years (35.0 ttl pk-yrs)    Types: Cigarettes    Start date: 02/1984    Quit date: 02/2019    Years since quitting: 4.0   Smokeless tobacco: Never   Tobacco comments:    not smoking-AH/04/11/2020  Substance and Sexual Activity   Alcohol use: No    Alcohol/week: 2.0 standard drinks of alcohol    Types: 2  Shots of liquor per week   Drug use: No   Sexual activity: Yes  Other Topics Concern   Not on file  Social History Narrative   Not on file   Social Drivers of Health   Financial Resource Strain: Low Risk  (11/30/2022)   Overall Financial Resource Strain (CARDIA)    Difficulty of Paying Living Expenses: Not very hard  Food Insecurity: No Food Insecurity (11/30/2022)   Hunger Vital Sign    Worried About Running Out of Food in the Last Year: Never true    Ran Out of Food in the Last Year: Never true  Transportation Needs: No Transportation Needs (11/30/2022)   PRAPARE - Administrator, Civil Service (Medical): No    Lack of Transportation (Non-Medical): No  Physical Activity: Unknown (11/30/2022)   Exercise Vital Sign    Days of Exercise per Week: Patient declined    Minutes of Exercise per Session: Not on file  Stress: No Stress Concern Present (11/30/2022)   [employer] of Occupational Health - Occupational Stress Questionnaire    Feeling of Stress : Only a little  Social Connections: Unknown (11/30/2022)   Social Connection and Isolation Panel [NHANES]    Frequency of Communication with Friends and Family: More than three times a week    Frequency of Social Gatherings with Friends and Family: Once a week    Attends Religious Services: Patient declined    Database administrator or Organizations: Patient declined    Attends Banker Meetings: Not on file    Marital Status: Divorced    Allergies:  Allergies  Allergen Reactions   Other Other (See Comments)   Sulfa Antibiotics Other (See Comments) and Hives    unknown    Metabolic Disorder Labs: Lab Results  Component Value Date   HGBA1C 5.5 12/17/2022   No results found for: "PROLACTIN" Lab Results  Component Value Date   CHOL 256 (H) 12/17/2022   TRIG 81 12/17/2022   HDL 93 12/17/2022   CHOLHDL 2.8 12/17/2022   LDLCALC 150 (H) 12/17/2022   Lab Results  Component Value Date   TSH  1.680 12/17/2022   TSH 2.286 10/11/2019    Therapeutic Level Labs: No results found for: "LITHIUM" No results found for: "VALPROATE" No results found for: "CBMZ"  Current Medications: Current Outpatient Medications  Medication Sig Dispense Refill   albuterol (PROVENTIL) (2.5 MG/3ML) 0.083% nebulizer solution Take 2.5 mg by nebulization every 4 (four) hours as needed for wheezing or shortness of breath.     albuterol (VENTOLIN HFA) 108 (90 Base) MCG/ACT inhaler INHALE 1 OR 2 PUFFS INTO THE LUNGS EVERY  4 TO 6 HOURS AS NEEDED 8.5 g 3   Budeson-Glycopyrrol-Formoterol (BREZTRI AEROSPHERE) 160-9-4.8 MCG/ACT AERO Inhale 2 puffs into the lungs in the morning and at bedtime. 32.1 g 3   buPROPion (WELLBUTRIN XL) 300 MG 24 hr tablet Take 1 tablet (300 mg total) by mouth daily. 90 tablet 2   CALCIUM PO Take 1 tablet by mouth daily.     clonazePAM (KLONOPIN) 1 MG tablet Take 1 tablet (1 mg total) by mouth at bedtime. 30 tablet 5   cyanocobalamin (VITAMIN B12) 1000 MCG tablet Take 1,000 mcg by mouth daily.     FLUoxetine (PROZAC) 40 MG capsule Take 1 capsule (40 mg total) by mouth every evening. 90 capsule 2   gabapentin (NEURONTIN) 300 MG capsule Take 1 capsule (300 mg total) by mouth 2 (two) times daily. 180 capsule 2   meloxicam (MOBIC) 15 MG tablet Take 1 tablet (15 mg total) by mouth daily as needed for pain. 30 tablet 3   pantoprazole (PROTONIX) 40 MG tablet Take 1 tablet (40 mg total) by mouth daily. For acid reflux     pramipexole (MIRAPEX) 0.25 MG tablet Take 0.25 mg by mouth daily.     No current facility-administered medications for this visit.     Musculoskeletal: Strength & Muscle Tone: within normal limits Gait & Station: normal Patient leans: N/A  Psychiatric Specialty Exam: Review of Systems  All other systems reviewed and are negative.   There were no vitals taken for this visit.There is no height or weight on file to calculate BMI.  General Appearance: Casual and Fairly  Groomed  Eye Contact:  Good  Speech:  Clear and Coherent  Volume:  Normal  Mood:  Euthymic  Affect:  Congruent  Thought Process:  Goal Directed  Orientation:  Full (Time, Place, and Person)  Thought Content: Within normal limits  Suicidal Thoughts:  No  Homicidal Thoughts:  No  Memory:  Immediate;   Good Recent;   Good Remote;   Good  Judgement:  Good  Insight:  Good  Psychomotor Activity:  Normal  Concentration:  Concentration: Good and Attention Span: Good  Recall:  Good  Fund of Knowledge: Good  Language: Good  Akathisia:  No  Handed:  Right  AIMS (if indicated): not done  Assets:  Communication Skills Desire for Improvement Resilience Social Support Talents/Skills  ADL's:  Intact  Cognition: WNL  Sleep:  Good   Screenings: AUDIT    Flowsheet Row [facility]  Alcohol Use Disorder Identification Test Final Score (AUDIT) 40      GAD-7    Flowsheet Row Office Visit from 01/07/2023 in [facility] Office Visit from 12/04/2022 in [facility]  Total GAD-7 Score 13 4      PHQ2-9    Flowsheet Row Office Visit from 01/07/2023 in [facility] Office Visit from 12/04/2022 in [facility] Video Visit from 09/16/2021 in [facility] Outpatient Behavioral Health at [place] Video Visit from 03/18/2021 in [facility] Outpatient Behavior[facility] Health at [place] Video Visit from 09/12/2020 in [facility] Health at [facility] Total Score 5 1 0 0 0  PHQ-9 Total Score 15 6 -- -- --      Flowsheet Row ED from 04/04/2022 in [facility] at [facility] Video Visit from 09/16/2021 in [facility] Outpatient Behavioral Health at [place] Video Visit from 03/18/2021  in [facility] Health Outpatient Behavioral Health at [place]  C-SSRS RISK CATEGORY No Risk No Risk No Risk         Assessment and Plan: This patient is a 68 year old female with a history of depression and anxiety.  She continues to do well on her current regimen.  She will continue clonazepam 1 mg at bedtime for sleep and anxiety, gabapentin 300 mg twice daily for anxiety, Wellbutrin XL 300 mg daily as well as Prozac 40 mg daily for depression.  She will return to see me in 6 months  Collaboration of Care: Collaboration of Care: Primary Care Provider AEB notes are shared with PCP on the epic system  Patient/Guardian was advised Release of Information must be obtained prior to any record release in order to collaborate their care with an outside provider. Patient/Guardian was advised if they have not already done so to contact the registration department to sign all necessary forms in order for [place] to release information regarding their care.   Consent: Patient/Guardian gives verbal consent for treatment and assignment of benefits for services provided during this visit. Patient/Guardian expressed understanding and agreed to proceed.    Diannia Ruder, MD 02/26/2023, 11:40 AM

## 2023-03-31 ENCOUNTER — Ambulatory Visit (INDEPENDENT_AMBULATORY_CARE_PROVIDER_SITE_OTHER): Payer: PPO

## 2023-03-31 VITALS — Ht 63.0 in | Wt 292.0 lb

## 2023-03-31 DIAGNOSIS — Z0000 Encounter for general adult medical examination without abnormal findings: Secondary | ICD-10-CM

## 2023-03-31 DIAGNOSIS — Z1231 Encounter for screening mammogram for malignant neoplasm of breast: Secondary | ICD-10-CM

## 2023-03-31 DIAGNOSIS — Z780 Asymptomatic menopausal state: Secondary | ICD-10-CM | POA: Diagnosis not present

## 2023-03-31 NOTE — Patient Instructions (Signed)
 Rebecca [name] , Thank you for taking time to come for your Medicare Wellness Visit. I appreciate your ongoing commitment to your health goals. Please review the following plan we discussed and let me know if I can assist you in the future.   Referrals/Orders/Follow-Ups/Clinician Recommendations:  Next Medicare Annual Wellness Visit:   April 04, 2024 at 1:10 pm video visit  An order has been placed for you to have your yearly mammogram. Please call the number below to schedule your appointment  [facility] at [facility] [address] [place], [place] [address] [phone]  Schedule your [place] screening mammogram through MyChart!   Log into your MyChart account.  Go to 'Visit' (or 'Appointments' if on mobile App) --> Schedule an Appointment  Under 'Select a Reason for Visit' choose the Mammogram Screening option.  Complete the pre-visit questions and select the time and place that best fits your schedule.  An order for a bone density scan has been placed for you today. Please call the number below to schedule your appt.   [facility] Imaging at [facility] [address] [place], [place] [address] [phone] Make sure to wear two-piece clothing.  No lotions powders or deodorants the day of the appointment Make sure to bring picture ID and insurance card.  Bring list of medications you are currently taking including any supplements.       This is a list of the screening recommended for you and due dates:  Health Maintenance  Topic Date Due   Zoster (Shingles) Vaccine (1 of 2) Never done   Pneumonia Vaccine (2 of 2 - PCV) 09/24/2012   COVID-19 Vaccine (5 - 2024-25 season) 10/05/2022   DEXA scan (bone density measurement)  03/07/2023   Mammogram  04/23/2023   Screening for Lung Cancer  08/15/2023   Medicare Annual Wellness Visit  03/30/2024   DTaP/Tdap/Td vaccine (2 - Td or Tdap) 06/05/2024   Colon Cancer Screening  10/04/2025    Flu Shot  Completed   Hepatitis C Screening  Completed   HPV Vaccine  Aged Out    Advanced directives: (Declined) Advance directive discussed with you today. Even though you declined this today, please call our office should you change your mind, and we can give you the proper paperwork for you to fill out.  Next Medicare Annual Wellness Visit scheduled for next year: yes  Understanding Your Risk for Falls Millions of people have serious injuries from falls each year. It is important to understand your risk of falling. Talk with your health care provider about your risk and what you can do to lower it. If you do have a serious fall, make sure to tell your provider. Falling once raises your risk of falling again. How can falls affect me? Serious injuries from falls are common. These include: Broken bones, such as hip fractures. Head injuries, such as traumatic brain injuries (TBI) or concussions. A fear of falling can cause you to avoid activities and stay at home. This can make your muscles weaker and raise your risk for a fall. What can increase my risk? There are a number of risk factors that increase your risk for falling. The more risk factors you have, the higher your risk of falling. Serious injuries from a fall happen most often to people who are older than 67 years old. Teenagers and young adults ages 23-29 are also at higher risk. Common risk factors include: Weakness in the lower body. Being  generally weak or confused due to long-term (chronic) illness. Dizziness or balance problems. Poor vision. Medicines that cause dizziness or drowsiness. These may include: Medicines for your blood pressure, heart, anxiety, insomnia, or swelling (edema). Pain medicines. Muscle relaxants. Other risk factors include: Drinking alcohol. Having had a fall in the past. Having foot pain or wearing improper footwear. Working at a dangerous job. Having any of the following in your home: Tripping  hazards, such as floor clutter or loose rugs. Poor lighting. Pets. Having dementia or memory loss. What actions can I take to lower my risk of falling?     Physical activity Stay physically fit. Do strength and balance exercises. Consider taking a regular class to build strength and balance. Yoga and tai chi are good options. Vision Have your eyes checked every year and your prescription for glasses or contacts updated as needed. Shoes and walking aids Wear non-skid shoes. Wear shoes that have rubber soles and low heels. Do not wear high heels. Do not walk around the house in socks or slippers. Use a cane or walker as told by your provider. Home safety Attach secure railings on both sides of your stairs. Install grab bars for your bathtub, shower, and toilet. Use a non-skid mat in your bathtub or shower. Attach bath mats securely with double-sided, non-slip rug tape. Use good lighting in all rooms. Keep a flashlight near your bed. Make sure there is a clear path from your bed to the bathroom. Use night-lights. Do not use throw rugs. Make sure all carpeting is taped or tacked down securely. Remove all clutter from walkways and stairways, including extension cords. Repair uneven or broken steps and floors. Avoid walking on icy or slippery surfaces. Walk on the grass instead of on icy or slick sidewalks. Use ice melter to get rid of ice on walkways in the winter. Use a cordless phone. Questions to ask your health care provider Can you help me check my risk for a fall? Do any of my medicines make me more likely to fall? Should I take a vitamin D supplement? What exercises can I do to improve my strength and balance? Should I make an appointment to have my vision checked? Do I need a bone density test to check for weak bones (osteoporosis)? Would it help to use a cane or a walker? Where to find more information Centers for Disease Control and Prevention, STEADI:  TonerPromos.no Community-Based Fall Prevention Programs: TonerPromos.no [employer] on Aging: BaseRingTones.pl Contact a health care provider if: You fall at home. You are afraid of falling at home. You feel weak, drowsy, or dizzy. This information is not intended to replace advice given to you by your health care provider. Make sure you discuss any questions you have with your health care provider. Document Revised: 09/23/2021 Document Reviewed: 09/23/2021 Elsevier Patient Education  2024 [employer].  Preventive Care 65 Years and Older, Female Preventive care refers to lifestyle choices and visits with your health care provider that can promote health and wellness. Preventive care visits are also called wellness exams. What can I expect for my preventive care visit? Counseling Your health care provider may ask you questions about your: Medical history, including: Past medical problems. Family medical history. Pregnancy and menstrual history. History of falls. Current health, including: Memory and ability to understand (cognition). Emotional well-being. Home life and relationship well-being. Sexual activity and sexual health. Lifestyle, including: Alcohol, nicotine or tobacco, and drug use. Access to firearms. Diet, exercise, and sleep habits. Work  and work environment. Sunscreen use. Safety issues such as seatbelt and bike helmet use. Physical exam Your health care provider will check your: Height and weight. These may be used to calculate your BMI (body mass index). BMI is a measurement that tells if you are at a healthy weight. Waist circumference. This measures the distance around your waistline. This measurement also tells if you are at a healthy weight and may help predict your risk of certain diseases, such as type 2 diabetes and high blood pressure. Heart rate and blood pressure. Body temperature. Skin for abnormal spots. What immunizations do I need?  Vaccines are usually  given at various ages, according to a schedule. Your health care provider will recommend vaccines for you based on your age, medical history, and lifestyle or other factors, such as travel or where you work. What tests do I need? Screening Your health care provider may recommend screening tests for certain conditions. This may include: Lipid and cholesterol levels. Hepatitis C test. Hepatitis B test. HIV (human immunodeficiency virus) test. STI (sexually transmitted infection) testing, if you are at risk. Lung cancer screening. Colorectal cancer screening. Diabetes screening. This is done by checking your blood sugar (glucose) after you have not eaten for a while (fasting). Mammogram. Talk with your health care provider about how often you should have regular mammograms. BRCA-related cancer screening. This may be done if you have a family history of breast, ovarian, tubal, or peritoneal cancers. Bone density scan. This is done to screen for osteoporosis. Talk with your health care provider about your test results, treatment options, and if necessary, the need for more tests. Follow these instructions at home: Eating and drinking  Eat a diet that includes fresh fruits and vegetables, whole grains, lean protein, and low-fat dairy products. Limit your intake of foods with high amounts of sugar, saturated fats, and salt. Take vitamin and mineral supplements as recommended by your health care provider. Do not drink alcohol if your health care provider tells you not to drink. If you drink alcohol: Limit how much you have to 0-1 drink a day. Know how much alcohol is in your drink. In the [place] one drink equals one 12 oz bottle of beer (355 mL), one 5 oz glass of wine (148 mL), or one 1 oz glass of hard liquor (44 mL). Lifestyle Brush your teeth every morning and night with fluoride toothpaste. Floss one time each day. Exercise for at least 30 minutes 5 or more days each week. Do not use any  products that contain nicotine or tobacco. These products include cigarettes, chewing tobacco, and vaping devices, such as e-cigarettes. If you need help quitting, ask your health care provider. Do not use drugs. If you are sexually active, practice safe sex. Use a condom or other form of protection in order to prevent STIs. Take aspirin only as told by your health care provider. Make sure that you understand how much to take and what form to take. Work with your health care provider to find out whether it is safe and beneficial for you to take aspirin daily. Ask your health care provider if you need to take a cholesterol-lowering medicine (statin). Find healthy ways to manage stress, such as: Meditation, yoga, or listening to music. Journaling. Talking to a trusted person. Spending time with friends and family. Minimize exposure to UV radiation to reduce your risk of skin cancer. Safety Always wear your seat belt while driving or riding in a vehicle. Do not drive: If  you have been drinking alcohol. Do not ride with someone who has been drinking. When you are tired or distracted. While texting. If you have been using any mind-altering substances or drugs. Wear a helmet and other protective equipment during sports activities. If you have firearms in your house, make sure you follow all gun safety procedures. What's next? Visit your health care provider once a year for an annual wellness visit. Ask your health care provider how often you should have your eyes and teeth checked. Stay up to date on all vaccines. This information is not intended to replace advice given to you by your health care provider. Make sure you discuss any questions you have with your health care provider. Document Revised: 07/18/2020 Document Reviewed: 07/18/2020 Elsevier Patient Education  2024 [employer].

## 2023-03-31 NOTE — Progress Notes (Signed)
 Because this visit was a virtual/telehealth visit,  certain criteria was not obtained, such a blood pressure, CBG if applicable, and timed get up and go. Any medications not marked as "taking" were not mentioned during the medication reconciliation part of the visit. Any vitals not documented were not able to be obtained due to this being a telehealth visit or patient was unable to self-report a recent blood pressure reading due to a lack of equipment at home via telehealth. Vitals that have been documented are verbally provided by the patient.   Subjective:   Rebecca [name] is a 68 y.o. who presents for a Medicare Wellness preventive visit.  Visit Complete: Virtual I connected with  Rebecca [name] on 03/31/23 by a audio enabled telemedicine application and verified that I am speaking with the correct person using two identifiers.  Patient Location: Home  Provider Location: Home Office  I discussed the limitations of evaluation and management by telemedicine. The patient expressed understanding and agreed to proceed.  Vital Signs: Because this visit was a virtual/telehealth visit, some criteria may be missing or patient reported. Any vitals not documented were not able to be obtained and vitals that have been documented are patient reported.  VideoError- Librarian, academic were attempted between this provider and patient, however failed, due to patient having technical difficulties OR patient did not have access to video capability.  We continued and completed visit with audio only.   AWV Questionnaire: No: Patient Medicare AWV questionnaire was not completed prior to this visit.  Cardiac Risk Factors include: advanced age (>58men, >49 women);obesity (BMI >30kg/m2);Other (see comment), Risk factor comments: emphysema     Objective:    Today's Vitals   03/31/23 1310  Weight: 292 lb (132.5 kg)  Height: 5\' 3"  (1.6 m)   Body mass index is 51.73  kg/m.     03/31/2023    1:53 PM 04/04/2022    4:24 PM 12/01/2015   11:00 PM 12/01/2015   10:00 PM 12/01/2015    4:16 PM 09/04/2013    6:24 PM 09/02/2013   11:02 AM  Advanced Directives  Does Patient Have a Medical Advance Directive? No No  No No  Patient has advance directive, copy not in chart  Type of Advance Directive       Healthcare Power of Attorney  Does patient want to make changes to medical advance directive?       No change requested  Copy of Healthcare Power of Attorney in Chart?       Copy requested from family  Would patient like information on creating a medical advance directive? No - Patient declined No - Patient declined Yes - Educational materials given Yes - Spiritual care consult ordered     Pre-existing out of facility DNR order (yellow form or pink MOST form)       No     Information is confidential and restricted. Go to Review Flowsheets to unlock data.    Current Medications (verified) Outpatient Encounter Medications as of 03/31/2023  Medication Sig   albuterol (PROVENTIL) (2.5 MG/3ML) 0.083% nebulizer solution Take 2.5 mg by nebulization every 4 (four) hours as needed for wheezing or shortness of breath.   albuterol (VENTOLIN HFA) 108 (90 Base) MCG/ACT inhaler INHALE 1 OR 2 PUFFS INTO THE LUNGS EVERY 4 TO 6 HOURS AS NEEDED   Budeson-Glycopyrrol-Formoterol (BREZTRI AEROSPHERE) 160-9-4.8 MCG/ACT AERO Inhale 2 puffs into the lungs in the morning and at bedtime.   buPROPion (WELLBUTRIN XL)  300 MG 24 hr tablet Take 1 tablet (300 mg total) by mouth daily.   CALCIUM PO Take 1 tablet by mouth daily.   clonazePAM (KLONOPIN) 1 MG tablet Take 1 tablet (1 mg total) by mouth at bedtime.   cyanocobalamin (VITAMIN B12) 1000 MCG tablet Take 1,000 mcg by mouth daily.   FLUoxetine (PROZAC) 40 MG capsule Take 1 capsule (40 mg total) by mouth every evening.   gabapentin (NEURONTIN) 300 MG capsule Take 1 capsule (300 mg total) by mouth 2 (two) times daily.   meloxicam (MOBIC) 15 MG  tablet Take 1 tablet (15 mg total) by mouth daily as needed for pain.   pantoprazole (PROTONIX) 40 MG tablet Take 1 tablet (40 mg total) by mouth daily. For acid reflux   pramipexole (MIRAPEX) 0.25 MG tablet Take 0.25 mg by mouth daily.   No facility-administered encounter medications on file as of 03/31/2023.    Allergies (verified) Other and Sulfa antibiotics   History: Past Medical History:  Diagnosis Date   Allergy    Anxiety    Arthritis    Asthma    Chronic pain began in 2011 after jooint replacements   Depression    Emphysema of lung (HCC)    GERD (gastroesophageal reflux disease)    Reflux    Past Surgical History:  Procedure Laterality Date   ABDOMINAL HYSTERECTOMY     BREAST CYST EXCISION Left    FOOT SURGERY  2011 -both feet   JOINT REPLACEMENT     KNEE SURGERY     left knee   SHOULDER SURGERY  2011   Family History  Problem Relation Age of Onset   Bipolar disorder Sister    Breast cancer Maternal Aunt    Breast cancer Maternal Grandmother    Alcohol abuse Maternal Grandfather    Breast cancer Cousin    Deep vein thrombosis Neg Hx    Pulmonary embolism Neg Hx    Heart disease Neg Hx    Heart failure Neg Hx    Social History   Socioeconomic History   Marital status: Divorced    Spouse name: Not on file   Number of children: 1   Years of education: Not on file   Highest education level: Associate degree: occupational, Scientist, product/process development, or vocational program  Occupational History   Occupation: Retired    Associate Professor: [employer]    Comment: RN  Tobacco Use   Smoking status: Former    Current packs/day: 0.00    Average packs/day: 1 pack/day for 35.0 years (35.0 ttl pk-yrs)    Types: Cigarettes    Start date: 02/1984    Quit date: 02/2019    Years since quitting: 4.1   Smokeless tobacco: Never   Tobacco comments:    not smoking-AH/04/11/2020  Substance and Sexual Activity   Alcohol use: No    Alcohol/week: 2.0 standard drinks of alcohol    Types: 2  Shots of liquor per week   Drug use: No   Sexual activity: Yes  Other Topics Concern   Not on file  Social History Narrative   Not on file   Social Drivers of Health   Financial Resource Strain: Low Risk  (03/31/2023)   Overall Financial Resource Strain (CARDIA)    Difficulty of Paying Living Expenses: Not very hard  Food Insecurity: No Food Insecurity (03/31/2023)   Hunger Vital Sign    Worried About Running Out of Food in the Last Year: Never true    Ran Out of Food  in the Last Year: Never true  Transportation Needs: No Transportation Needs (03/31/2023)   PRAPARE - Administrator, Civil Service (Medical): No    Lack of Transportation (Non-Medical): No  Physical Activity: Patient Declined (03/31/2023)   Exercise Vital Sign    Days of Exercise per Week: Patient declined    Minutes of Exercise per Session: Patient declined  Stress: No Stress Concern Present (03/31/2023)   [employer] of Occupational Health - Occupational Stress Questionnaire    Feeling of Stress : Not at all  Social Connections: Unknown (03/31/2023)   Social Connection and Isolation Panel [NHANES]    Frequency of Communication with Friends and Family: More than three times a week    Frequency of Social Gatherings with Friends and Family: Once a week    Attends Religious Services: Patient declined    Database administrator or Organizations: Patient declined    Attends Engineer, structural: Patient declined    Marital Status: Divorced    Tobacco Counseling Counseling given: Yes Tobacco comments: not smoking-AH/04/11/2020    Clinical Intake:  Pre-visit preparation completed: Yes  Pain : No/denies pain     BMI - recorded: 51.73 Nutritional Status: BMI > 30  Obese Nutritional Risks: None Diabetes: No  How often do you need to have someone help you when you read instructions, pamphlets, or other written materials from your doctor or pharmacy?: 1 - Never  Interpreter Needed?:  No  Information entered by :: Maryjean Ka CMA   Activities of Daily Living     03/31/2023    1:53 PM  In your present state of health, do you have any difficulty performing the following activities:  Hearing? 0  Vision? 0  Difficulty concentrating or making decisions? 0  Walking or climbing stairs? 0  Dressing or bathing? 0  Doing errands, shopping? 0  Preparing Food and eating ? N  Using the Toilet? N  In the past six months, have you accidently leaked urine? N  Do you have problems with loss of bowel control? N  Managing your Medications? N  Managing your Finances? N  Housekeeping or managing your Housekeeping? N    Patient Care Team: Del Newman Nip, Tenna Child, FNP as PCP - General (Family Medicine) Myrlene Broker, MD as Consulting Physician [facility]) Nyoka Cowden, MD as Consulting Physician (Pulmonary Disease) [name], NP as Nurse Practitioner (Pulmonary Disease)  Indicate any recent Medical Services you may have received from other than [facility] providers in the past year (date may be approximate).     Assessment:   This is a routine wellness examination for [place].  Hearing/Vision screen Hearing Screening - Comments:: Patient denies any hearing difficulties.   Vision Screening - Comments:: Wears rx glasses - up to date with routine eye exams     Goals Addressed             This Visit's Progress    Patient Stated       Lose some weight       Depression Screen     03/31/2023    1:27 PM 01/07/2023    4:22 PM 12/04/2022    8:18 AM 09/16/2021   11:07 AM 03/18/2021    2:37 PM 09/12/2020    3:09 PM  PHQ 2/9 Scores  PHQ - 2 Score 0 5 1     PHQ- 9 Score 0 15 6        Information is confidential and restricted. Go  to Review Flowsheets to unlock data.    Fall Risk     03/31/2023    1:53 PM 01/07/2023    4:24 PM 12/04/2022    8:19 AM 07/05/2019    2:24 PM  Fall Risk   Falls in the past year? 1 1 1 1   Number falls in past yr: 1 1 1    Injury  with Fall? 1 1 1    Risk for fall due to : History of fall(s);Impaired balance/gait;Impaired mobility Impaired balance/gait;Impaired mobility Impaired balance/gait   Follow up Education provided;Falls prevention discussed Follow up appointment Follow up appointment     MEDICARE RISK AT HOME:  Medicare Risk at Home Any stairs in or around the home?: No If so, are there any without handrails?: No Home free of loose throw rugs in walkways, pet beds, electrical cords, etc?: Yes Adequate lighting in your home to reduce risk of falls?: Yes Life alert?: No Use of a cane, walker or w/c?: No Grab bars in the bathroom?: No Shower chair or bench in shower?: No Elevated toilet seat or a handicapped toilet?: No  TIMED UP AND GO:  Was the test performed?  No  Cognitive Function: 6CIT completed        03/31/2023    1:52 PM  6CIT Screen  What Year? 0 points  What month? 0 points  What time? 0 points  Count back from 20 0 points  Months in reverse 0 points  Repeat phrase 0 points  Total Score 0 points    Immunizations Immunization History  Administered Date(s) Administered   Fluad Trivalent(High Dose 65+) 12/04/2022   Influenza Split 11/04/2010, 11/27/2017, 11/16/2018, 12/15/2020   Influenza,inj,Quad PF,6+ Mos 11/18/2011, 02/27/2020   Influenza,inj,quad, With Preservative 12/06/2013   Moderna Sars-Covid-2 Vaccination 04/21/2019, 05/20/2019, 12/24/2019   Pneumococcal Polysaccharide-23 09/25/2011   Tdap 06/06/2014    Screening Tests Health Maintenance  Topic Date Due   Zoster Vaccines- Shingrix (1 of 2) Never done   Pneumonia Vaccine 47+ Years old (2 of 2 - PCV) 09/24/2012   COVID-19 Vaccine (5 - 2024-25 season) 10/05/2022   DEXA SCAN  03/07/2023   MAMMOGRAM  04/23/2023   Lung Cancer Screening  08/15/2023   Medicare Annual Wellness (AWV)  03/30/2024   DTaP/Tdap/Td (2 - Td or Tdap) 06/05/2024   Colonoscopy  10/04/2025   INFLUENZA VACCINE  Completed   Hepatitis C Screening   Completed   HPV VACCINES  Aged Out    Health Maintenance  Health Maintenance Due  Topic Date Due   Zoster Vaccines- Shingrix (1 of 2) Never done   Pneumonia Vaccine 19+ Years old (2 of 2 - PCV) 09/24/2012   COVID-19 Vaccine (5 - 2024-25 season) 10/05/2022   DEXA SCAN  03/07/2023   Health Maintenance Items Addressed: Mammogram ordered, DEXA ordered  Additional Screening:  Vision Screening: Recommended annual ophthalmology exams for early detection of glaucoma and other disorders of the eye.  Dental Screening: Recommended annual dental exams for proper oral hygiene  Community Resource Referral / Chronic Care Management: CRR required this visit?  No   CCM required this visit?  No     Plan:     I have personally reviewed and noted the following in the patient's chart:   Medical and social history Use of alcohol, tobacco or illicit drugs  Current medications and supplements including opioid prescriptions. Patient is not currently taking opioid prescriptions. Functional ability and status Nutritional status Physical activity Advanced directives List of other physicians Hospitalizations, surgeries, and  ER visits in previous 12 months Vitals Screenings to include cognitive, depression, and falls Referrals and appointments  In addition, I have reviewed and discussed with patient certain preventive protocols, quality metrics, and best practice recommendations. A written personalized care plan for preventive services as well as general preventive health recommendations were provided to patient.     Jordan Hawks Mirenda Baltazar, CMA   03/31/2023   After Visit Summary: (MyChart) Due to this being a telephonic visit, the after visit summary with patients personalized plan was offered to patient via MyChart   Notes: Please refer to Routing Comments.

## 2023-04-02 NOTE — Patient Instructions (Signed)
        Great to see you today.  I have refilled the medication(s) we provide.    If labs were collected, we will inform you of lab results once received either by echart message or telephone call.   - echart message- for normal results that have been seen by the patient already.   - telephone call: abnormal results or if patient has not viewed results in their echart.   - Please take medications as prescribed. - Follow up with your primary health provider if any health concerns arises. - If symptoms worsen please contact your primary care provider and/or visit the emergency department.       Check Blood Glucose  daily or as needed   Fasting Blood Glucose (Before Meals):   Target  80-130    Postprandial Blood Glucose (1-2 Hours After Meals):   Target Less than 180     When your blood glucose level falls below 70 mg/dL, it is considered low

## 2023-04-02 NOTE — Progress Notes (Unsigned)
   Established Patient Office Visit   Subjective  Patient ID: Rebecca [name], female    DOB: August 28, 1955  Age: 68 y.o. MRN: [id number]  No chief complaint on file.   She  has a past medical history of Allergy, Anxiety, Arthritis, Asthma, Chronic pain (began in 2011 after jooint replacements), Depression, Emphysema of lung (HCC), GERD (gastroesophageal reflux disease), and Reflux.  HPI  ROS    Objective:     There were no vitals taken for this visit. {Vitals History (Optional):23777}  Physical Exam   No results found for any visits on 04/03/23.  The ASCVD Risk score (Arnett DK, et al., 2019) failed to calculate for the following reasons:   The systolic blood pressure is missing    Assessment & Plan:  There are no diagnoses linked to this encounter.  No follow-ups on file.   Cruzita Lederer Newman Nip, FNP

## 2023-04-03 ENCOUNTER — Ambulatory Visit (INDEPENDENT_AMBULATORY_CARE_PROVIDER_SITE_OTHER): Payer: PPO | Admitting: Family Medicine

## 2023-04-03 ENCOUNTER — Encounter: Payer: Self-pay | Admitting: Family Medicine

## 2023-04-03 VITALS — BP 132/68 | HR 94 | Ht 63.0 in | Wt 295.6 lb

## 2023-04-03 DIAGNOSIS — G8929 Other chronic pain: Secondary | ICD-10-CM | POA: Diagnosis not present

## 2023-04-03 DIAGNOSIS — R600 Localized edema: Secondary | ICD-10-CM | POA: Diagnosis not present

## 2023-04-03 DIAGNOSIS — Z13820 Encounter for screening for osteoporosis: Secondary | ICD-10-CM

## 2023-04-03 DIAGNOSIS — E538 Deficiency of other specified B group vitamins: Secondary | ICD-10-CM | POA: Diagnosis not present

## 2023-04-03 DIAGNOSIS — M25562 Pain in left knee: Secondary | ICD-10-CM

## 2023-04-03 DIAGNOSIS — E782 Mixed hyperlipidemia: Secondary | ICD-10-CM | POA: Diagnosis not present

## 2023-04-03 DIAGNOSIS — M25561 Pain in right knee: Secondary | ICD-10-CM | POA: Diagnosis not present

## 2023-04-03 DIAGNOSIS — Z23 Encounter for immunization: Secondary | ICD-10-CM | POA: Diagnosis not present

## 2023-04-03 DIAGNOSIS — Z1231 Encounter for screening mammogram for malignant neoplasm of breast: Secondary | ICD-10-CM

## 2023-04-03 MED ORDER — FUROSEMIDE 20 MG PO TABS: 20.0000 mg | ORAL_TABLET | ORAL | 2 refills | Status: AC

## 2023-04-03 MED ORDER — BLOOD GLUCOSE MONITORING SUPPL DEVI
1.0000 | Freq: Three times a day (TID) | 0 refills | Status: AC
Start: 2023-04-03 — End: ?

## 2023-04-03 MED ORDER — PREDNISONE 20 MG PO TABS: 20.0000 mg | ORAL_TABLET | Freq: Two times a day (BID) | ORAL | 0 refills | Status: AC

## 2023-04-03 MED ORDER — LANCET DEVICE MISC: 1.0000 | Freq: Three times a day (TID) | 0 refills | Status: AC

## 2023-04-03 MED ORDER — BLOOD GLUCOSE TEST VI STRP: 1.0000 | ORAL_STRIP | Freq: Three times a day (TID) | 0 refills | Status: AC

## 2023-04-03 MED ORDER — LANCETS MISC. MISC: 1.0000 | Freq: Three times a day (TID) | 0 refills | Status: AC

## 2023-04-03 NOTE — Assessment & Plan Note (Signed)
 Negative Homan's sign Lasix 20 mg PRN Advise patient follow a low-salt diet, wear support stockings, maintain an exercise routine 3-5 times a week, keep legs on elevated position raise legs on pillow above your heart while lying down.

## 2023-04-03 NOTE — Assessment & Plan Note (Signed)
 Prednisone 20 mg twice daily x 5 days Advise patient treatment plan for knee pain includes rest, applying ice for 15-20 minutes several times a day, and gentle stretching and strengthening exercises to improve muscle support. Possible Physical therapy referral may help with proper technique. Using a knee brace or compression bandage can provide support, and elevating the leg helps reduce swelling. Low-impact exercises like swimming or cycling should be introduced gradually, and maintaining a healthy weight can prevent future pain.

## 2023-04-04 ENCOUNTER — Encounter: Payer: Self-pay | Admitting: Family Medicine

## 2023-04-05 ENCOUNTER — Encounter: Payer: Self-pay | Admitting: Family Medicine

## 2023-04-05 LAB — CBC WITH DIFFERENTIAL/PLATELET
Basophils Absolute: 0.2 10*3/uL (ref 0.0–0.2)
Basos: 2 %
EOS (ABSOLUTE): 0.5 10*3/uL — ABNORMAL HIGH (ref 0.0–0.4)
Eos: 6 %
Hematocrit: 37.3 % (ref 34.0–46.6)
Hemoglobin: 12 g/dL (ref 11.1–15.9)
Immature Grans (Abs): 0.1 10*3/uL (ref 0.0–0.1)
Immature Granulocytes: 1 %
Lymphocytes Absolute: 1.8 10*3/uL (ref 0.7–3.1)
Lymphs: 21 %
MCH: 27.4 pg (ref 26.6–33.0)
MCHC: 32.2 g/dL (ref 31.5–35.7)
MCV: 85 fL (ref 79–97)
Monocytes Absolute: 0.6 10*3/uL (ref 0.1–0.9)
Monocytes: 7 %
Neutrophils Absolute: 5.6 10*3/uL (ref 1.4–7.0)
Neutrophils: 63 %
Platelets: 441 10*3/uL (ref 150–450)
RBC: 4.38 x10E6/uL (ref 3.77–5.28)
RDW: 13.7 % (ref 11.7–15.4)
WBC: 8.8 10*3/uL (ref 3.4–10.8)

## 2023-04-05 LAB — BMP8+EGFR
BUN/Creatinine Ratio: 14 (ref 12–28)
BUN: 12 mg/dL (ref 8–27)
CO2: 22 mmol/L (ref 20–29)
Calcium: 9.3 mg/dL (ref 8.7–10.3)
Chloride: 101 mmol/L (ref 96–106)
Creatinine, Ser: 0.84 mg/dL (ref 0.57–1.00)
Glucose: 89 mg/dL (ref 70–99)
Potassium: 4.7 mmol/L (ref 3.5–5.2)
Sodium: 139 mmol/L (ref 134–144)
eGFR: 77 mL/min/{1.73_m2} (ref >59)

## 2023-04-05 LAB — LIPID PANEL
Chol/HDL Ratio: 2.5 ratio (ref 0.0–4.4)
Cholesterol, Total: 230 mg/dL — ABNORMAL HIGH (ref 100–199)
HDL: 91 mg/dL (ref >39)
LDL Chol Calc (NIH): 123 mg/dL — ABNORMAL HIGH (ref 0–99)
Triglycerides: 94 mg/dL (ref 0–149)
VLDL Cholesterol Cal: 16 mg/dL (ref 5–40)

## 2023-04-05 LAB — VITAMIN B12

## 2023-04-08 ENCOUNTER — Encounter: Payer: Self-pay | Admitting: Family Medicine

## 2023-04-08 ENCOUNTER — Other Ambulatory Visit: Payer: Self-pay | Admitting: Family Medicine

## 2023-04-08 MED ORDER — ROSUVASTATIN CALCIUM 10 MG PO TABS: 10.0000 mg | ORAL_TABLET | Freq: Every day | ORAL | 3 refills | Status: AC

## 2023-04-08 NOTE — Progress Notes (Unsigned)
cresto 

## 2023-04-09 ENCOUNTER — Other Ambulatory Visit: Payer: Self-pay | Admitting: Family Medicine

## 2023-04-23 ENCOUNTER — Other Ambulatory Visit: Payer: Self-pay | Admitting: Family Medicine

## 2023-04-27 ENCOUNTER — Ambulatory Visit (HOSPITAL_COMMUNITY)

## 2023-04-27 ENCOUNTER — Encounter (HOSPITAL_COMMUNITY): Payer: Self-pay

## 2023-04-27 ENCOUNTER — Other Ambulatory Visit (HOSPITAL_COMMUNITY)

## 2023-05-15 ENCOUNTER — Ambulatory Visit (HOSPITAL_COMMUNITY)
Admission: RE | Admit: 2023-05-15 | Discharge: 2023-05-15 | Disposition: A | Discharge status: Home / Self Care | Source: Ambulatory Visit | Attending: Family Medicine | Admitting: Family Medicine

## 2023-05-15 ENCOUNTER — Encounter (HOSPITAL_COMMUNITY): Payer: Self-pay

## 2023-05-15 DIAGNOSIS — Z87310 Personal history of (healed) osteoporosis fracture: Secondary | ICD-10-CM | POA: Insufficient documentation

## 2023-05-15 DIAGNOSIS — Z1231 Encounter for screening mammogram for malignant neoplasm of breast: Secondary | ICD-10-CM | POA: Diagnosis not present

## 2023-05-15 DIAGNOSIS — M810 Age-related osteoporosis without current pathological fracture: Secondary | ICD-10-CM | POA: Diagnosis not present

## 2023-05-15 DIAGNOSIS — R921 Mammographic calcification found on diagnostic imaging of breast: Secondary | ICD-10-CM | POA: Insufficient documentation

## 2023-05-15 DIAGNOSIS — Z13820 Encounter for screening for osteoporosis: Secondary | ICD-10-CM | POA: Diagnosis not present

## 2023-05-15 DIAGNOSIS — Z780 Asymptomatic menopausal state: Secondary | ICD-10-CM | POA: Diagnosis not present

## 2023-05-20 ENCOUNTER — Other Ambulatory Visit (HOSPITAL_COMMUNITY): Payer: Self-pay | Admitting: Family Medicine

## 2023-05-20 ENCOUNTER — Encounter: Payer: Self-pay | Admitting: Family Medicine

## 2023-05-20 DIAGNOSIS — R928 Other abnormal and inconclusive findings on diagnostic imaging of breast: Secondary | ICD-10-CM

## 2023-05-21 ENCOUNTER — Other Ambulatory Visit: Payer: Self-pay | Admitting: Family Medicine

## 2023-05-21 MED ORDER — ALENDRONATE SODIUM 70 MG PO TABS: 70.0000 mg | ORAL_TABLET | ORAL | 2 refills | Status: DC

## 2023-05-21 NOTE — Telephone Encounter (Signed)
Fosamax sent

## 2023-05-26 ENCOUNTER — Ambulatory Visit (HOSPITAL_COMMUNITY)
Admission: RE | Admit: 2023-05-26 | Discharge: 2023-05-26 | Disposition: A | Discharge status: Home / Self Care | Source: Ambulatory Visit | Attending: Family Medicine | Admitting: Family Medicine

## 2023-05-26 ENCOUNTER — Encounter (HOSPITAL_COMMUNITY): Payer: Self-pay

## 2023-05-26 DIAGNOSIS — R928 Other abnormal and inconclusive findings on diagnostic imaging of breast: Secondary | ICD-10-CM

## 2023-05-26 DIAGNOSIS — Z803 Family history of malignant neoplasm of breast: Secondary | ICD-10-CM | POA: Diagnosis not present

## 2023-05-26 DIAGNOSIS — R92313 Mammographic fatty tissue density, bilateral breasts: Secondary | ICD-10-CM | POA: Diagnosis not present

## 2023-05-26 DIAGNOSIS — R921 Mammographic calcification found on diagnostic imaging of breast: Secondary | ICD-10-CM | POA: Diagnosis not present

## 2023-05-27 ENCOUNTER — Encounter (HOSPITAL_COMMUNITY): Payer: Self-pay | Admitting: Family Medicine

## 2023-05-27 ENCOUNTER — Other Ambulatory Visit (HOSPITAL_COMMUNITY): Payer: Self-pay | Admitting: Family Medicine

## 2023-05-27 DIAGNOSIS — R921 Mammographic calcification found on diagnostic imaging of breast: Secondary | ICD-10-CM

## 2023-05-27 DIAGNOSIS — R928 Other abnormal and inconclusive findings on diagnostic imaging of breast: Secondary | ICD-10-CM

## 2023-05-28 ENCOUNTER — Encounter: Payer: Self-pay | Admitting: Family Medicine

## 2023-05-28 ENCOUNTER — Ambulatory Visit: Admitting: Family Medicine

## 2023-05-28 VITALS — BP 119/74 | HR 80 | Ht 63.0 in | Wt 278.0 lb

## 2023-05-28 DIAGNOSIS — K1379 Other lesions of oral mucosa: Secondary | ICD-10-CM

## 2023-05-28 DIAGNOSIS — K1370 Unspecified lesions of oral mucosa: Secondary | ICD-10-CM

## 2023-05-28 NOTE — Assessment & Plan Note (Signed)
 Continue fluocinonide and clobetasol ointments Follow up with Dentist for management  Encourage gentle oral hygiene with a soft-bristled toothbrush and alcohol-free mouthwash. Avoid spicy, acidic, or crunchy foods that may irritate the sores. Continue using prescribed topical treatments as directed for symptom relief.

## 2023-05-28 NOTE — Progress Notes (Signed)
 Established Patient Office Visit   Subjective  Patient ID: Rebecca [name], female    DOB: [birth year]  Age: 68 y.o. MRN: [id number]  Chief Complaint  Patient presents with   Mouth Lesions    Sore that has created a hole  in mouth, in the inner left side of cheek she has seen the dentist and they are wanting her to see an oral surgeon  GI symptoms since starting wt. Loss shots she is experiencing constipation, nausea    She  has a past medical history of Allergy, Anxiety, Arthritis, Asthma, Chronic pain (began in 2011 after jooint replacements), Depression, Emphysema of lung (HCC), GERD (gastroesophageal reflux disease), and Reflux.   Oral Lesions: Patient reports oral lesions that began approximately 3 weeks ago with a gradual onset. The condition has remained stable since onset and is described as moderate in severity. Symptoms are relieved by topical treatments, including fluocinonide and clobetasol ointments. Eating and oral movement exacerbate the discomfort. Associated symptoms include the presence of mouth sores.Denies sore throat.    Review of Systems  Constitutional:  Negative for chills and fever.  Eyes:  Negative for blurred vision.  Respiratory:  Negative for shortness of breath.   Cardiovascular:  Negative for chest pain.  Gastrointestinal:  Negative for abdominal pain.  Neurological:  Negative for dizziness and headaches.      Objective:     BP 119/74   Pulse 80   Ht 5\' 3"  (1.6 m)   Wt 278 lb 0.6 oz (126.1 kg)   SpO2 91%   BMI 49.25 kg/m  BP Readings from Last 3 Encounters:  05/28/23 119/74  04/03/23 132/68  01/07/23 138/81      Physical Exam Vitals reviewed.  Constitutional:      General: She is not in acute distress.    Appearance: Normal appearance. She is not ill-appearing, toxic-appearing or diaphoretic.  HENT:     Head: Normocephalic.     Comments: Lesion is located on both the right and left sides of the midline of the hard palate. The  area appears ulcerated with tissue breakdown but without surrounding erythema or purulent discharge Eyes:     General:        Right eye: No discharge.        Left eye: No discharge.     Conjunctiva/sclera: Conjunctivae normal.  Cardiovascular:     Rate and Rhythm: Normal rate.     Pulses: Normal pulses.     Heart sounds: Normal heart sounds.  Pulmonary:     Effort: Pulmonary effort is normal. No respiratory distress.     Breath sounds: Normal breath sounds.  Abdominal:     General: Bowel sounds are normal.     Palpations: Abdomen is soft.     Tenderness: There is no abdominal tenderness. There is no guarding.  Musculoskeletal:        General: Normal range of motion.     Cervical back: Normal range of motion.  Skin:    General: Skin is warm and dry.     Capillary Refill: Capillary refill takes less than 2 seconds.  Neurological:     Mental Status: She is alert.  Psychiatric:        Mood and Affect: Mood normal.        Behavior: Behavior normal.      No results found for any visits on 05/28/23.  The 10-year ASCVD risk score (Arnett DK, et al., 2019) is: 4.8%    Assessment &  Plan:  Mouth sores -     Ambulatory referral to Dentistry  Oral mucosal lesion Assessment & Plan: Continue fluocinonide and clobetasol ointments Follow up with Dentist for management  Encourage gentle oral hygiene with a soft-bristled toothbrush and alcohol-free mouthwash. Avoid spicy, acidic, or crunchy foods that may irritate the sores. Continue using prescribed topical treatments as directed for symptom relief.     Return if symptoms worsen or fail to improve.   Avelino Lek Amber Bail, FNP

## 2023-05-28 NOTE — Patient Instructions (Signed)

## 2023-06-03 ENCOUNTER — Ambulatory Visit
Admission: RE | Admit: 2023-06-03 | Discharge: 2023-06-03 | Disposition: A | Discharge status: Home / Self Care | Source: Ambulatory Visit | Attending: Family Medicine

## 2023-06-03 ENCOUNTER — Ambulatory Visit
Admission: RE | Admit: 2023-06-03 | Discharge: 2023-06-03 | Disposition: A | Discharge status: Home / Self Care | Source: Ambulatory Visit | Attending: Family Medicine | Admitting: Family Medicine

## 2023-06-03 DIAGNOSIS — R928 Other abnormal and inconclusive findings on diagnostic imaging of breast: Secondary | ICD-10-CM

## 2023-06-03 DIAGNOSIS — N6489 Other specified disorders of breast: Secondary | ICD-10-CM | POA: Diagnosis not present

## 2023-06-03 DIAGNOSIS — R921 Mammographic calcification found on diagnostic imaging of breast: Secondary | ICD-10-CM

## 2023-06-03 DIAGNOSIS — N6313 Unspecified lump in the right breast, lower outer quadrant: Secondary | ICD-10-CM | POA: Diagnosis not present

## 2023-06-03 HISTORY — PX: BREAST BIOPSY: SHX20

## 2023-06-04 LAB — SURGICAL PATHOLOGY

## 2023-06-11 ENCOUNTER — Encounter: Payer: Self-pay | Admitting: Family Medicine

## 2023-06-11 ENCOUNTER — Telehealth: Payer: Self-pay | Admitting: Family Medicine

## 2023-06-11 NOTE — Telephone Encounter (Signed)
 Copied from CRM [id number]. Topic: Referral - Status >> Jun 11, 2023  3:07 PM Ja-Kwan M wrote: Reason for CRM: Patient stated that she has an appointment tomorrow at 2:30 pm with the specialist regarding the mouth lesion.Patient requests that the referral be sent to [facility] for Dr. Rush Coupe phone# [phone]

## 2023-06-11 NOTE — Telephone Encounter (Signed)
 I sent referral in a few weeks ago

## 2023-06-12 DIAGNOSIS — K121 Other forms of stomatitis: Secondary | ICD-10-CM | POA: Diagnosis not present

## 2023-06-12 DIAGNOSIS — Z87891 Personal history of nicotine dependence: Secondary | ICD-10-CM | POA: Diagnosis not present

## 2023-06-12 NOTE — Telephone Encounter (Signed)
 Sent to the referral coordinator for update.

## 2023-06-12 NOTE — Telephone Encounter (Signed)
 Placed the referral on 05/28/23 still pending

## 2023-06-12 NOTE — Telephone Encounter (Signed)
 Hey, I talked with Rebecca [name] (she has sent me a message earlier concerning this Patient) - We do not handle Dental Referrals as we only handle medical insurance. Patient should not need an Appt to establish care with a Dentist.

## 2023-06-12 NOTE — Telephone Encounter (Signed)
 Per the referral team :  With Dental Referral's, No referral is needed to be established - Patient may call Office of choice, Verify they accept her Dental Insurance, and schedule own Appt.   MA has sent pt. A My Chart message reviewing this information and to ask if she needs any further assistance.   Would you like to take any further steps?  Please advise, Thank you.

## 2023-06-12 NOTE — Telephone Encounter (Signed)
 I have sent her a mychart message to alert her of the referral team response and she has responded w/ understanding. She says Thank you for the fast response. :)

## 2023-06-12 NOTE — Telephone Encounter (Signed)
Please advise if referral is approved?

## 2023-06-12 NOTE — Telephone Encounter (Signed)
 Please let patient know this information thanks ! If patient is aware she can call and make an appointment

## 2023-06-15 ENCOUNTER — Encounter: Payer: Self-pay | Admitting: Family Medicine

## 2023-06-16 NOTE — Telephone Encounter (Signed)
 I recommend contacting their office to update your records and list me as your primary care provider.

## 2023-06-17 ENCOUNTER — Encounter: Payer: Self-pay | Admitting: Family Medicine

## 2023-06-18 ENCOUNTER — Other Ambulatory Visit (HOSPITAL_COMMUNITY): Payer: Self-pay

## 2023-06-18 ENCOUNTER — Telehealth: Payer: Self-pay | Admitting: Pharmacy Technician

## 2023-06-18 NOTE — Telephone Encounter (Signed)
 Pharmacy Patient Advocate Encounter  Insurance verification completed.   The patient is insured through [facility] ADVANTAGE/RX ADVANCE   Ran test claim for [id number].  NOT COVERED UNDER MEDICARE PART D FOR WEIGHT LOSS.     This test claim was processed through [facility]- copay amounts may vary at other pharmacies due to pharmacy/plan contracts, or as the patient moves through the different stages of their insurance plan.

## 2023-06-18 NOTE — Telephone Encounter (Signed)
 Please refer to pharmacy if we can send in this medication

## 2023-06-19 ENCOUNTER — Other Ambulatory Visit: Payer: Self-pay | Admitting: Family Medicine

## 2023-06-19 MED ORDER — SEMAGLUTIDE-WEIGHT MANAGEMENT 0.25 MG/0.5ML ~~LOC~~ SOAJ: 0.2500 mg | SUBCUTANEOUS | 0 refills | Status: DC

## 2023-06-19 NOTE — Telephone Encounter (Signed)
 Send the prescription to: [facility] NPI: [id number] NCPDP: [id number]  I have added this pharmacy to the patient's chart. You can order via Epic.

## 2023-06-19 NOTE — Telephone Encounter (Signed)
 Sent!

## 2023-06-19 NOTE — Telephone Encounter (Signed)
 Hi Yosseline, how do I go about this?

## 2023-06-23 NOTE — Telephone Encounter (Signed)
 I have not received any forms yet.. will check my inbox tmmr

## 2023-06-24 NOTE — Telephone Encounter (Signed)
 Awaiting paperwork

## 2023-06-26 ENCOUNTER — Telehealth: Payer: Self-pay | Admitting: Family Medicine

## 2023-06-26 NOTE — Telephone Encounter (Signed)
 Copied from CRM [id number]. Topic: Clinical - Prescription Issue >> Jun 26, 2023  4:34 PM [employer] Arts wrote: Reason for CRM: Semaglutide -Weight Management 0.25 MG/0.5ML SOAJ can't say auto inject. It needs to say vial and sent to preferred pharmacy.  Callback #: [phone]  Preferred Pharmacy:  [facility] [place], [place] - [phone] [place] [address] Phone: [phone] Fax: [phone] Hours: Not open 24 hours

## 2023-06-30 ENCOUNTER — Other Ambulatory Visit: Payer: Self-pay | Admitting: Family Medicine

## 2023-06-30 MED ORDER — SEMAGLUTIDE-WEIGHT MANAGEMENT 0.25 MG/0.5ML ~~LOC~~ SOAJ: 0.2500 mg | SUBCUTANEOUS | 0 refills | Status: DC

## 2023-06-30 NOTE — Telephone Encounter (Signed)
 I have no other option - I resent again

## 2023-07-03 ENCOUNTER — Other Ambulatory Visit: Payer: Self-pay | Admitting: Family Medicine

## 2023-07-03 MED ORDER — TIRZEPATIDE-WEIGHT MANAGEMENT 2.5 MG/0.5ML ~~LOC~~ SOLN: 2.5000 mg | SUBCUTANEOUS | 0 refills | Status: DC

## 2023-07-09 ENCOUNTER — Telehealth: Payer: Self-pay | Admitting: Internal Medicine

## 2023-07-09 NOTE — Telephone Encounter (Signed)
 Spoke with patient regarding new appointment date and time for Dr. Waymond Hailey (provider not in office 09/01/23) rescheduled to 09/02/23 at 2:30 pm--will mail information to patient and she voiced her understanding

## 2023-07-21 ENCOUNTER — Other Ambulatory Visit: Payer: Self-pay | Admitting: Family Medicine

## 2023-07-27 ENCOUNTER — Encounter: Payer: Self-pay | Admitting: Family Medicine

## 2023-07-28 ENCOUNTER — Other Ambulatory Visit: Payer: Self-pay | Admitting: Family Medicine

## 2023-07-28 MED ORDER — TIRZEPATIDE-WEIGHT MANAGEMENT 5 MG/0.5ML ~~LOC~~ SOLN: 5.0000 mg | SUBCUTANEOUS | 0 refills | Status: DC

## 2023-07-28 NOTE — Telephone Encounter (Signed)
 Hi Rebecca [name],  Thank you for your message. At this time, we are unable to start you on a higher dose of Zepbound . Since you are initiating this medication with us , we need to follow clinical guidelines and start from the beginning of the dosing schedule, regardless of prior use through other programs.  We understand the previous dosing may have been higher, but for your safety and to monitor for potential side effects, gradual titration is necessary.    Zepbound  Monthly Dosing Schedule:  Month 1: 2.5 mg once a week  Month 2: 5 mg once a week  Month 3: 7.5 mg once a week   Month 4: 10 mg once a week  Month 5: 12.5 mg once a week   Month 6+: 15 mg once a week (max dose)  Common Side Effects:  Nausea  Constipation  Decreased appetite  Bloating or gas  Fatigue or headache  Treatment Tips  Eat small, bland meals (e.g., toast, crackers)  Stay hydrated--sip water throughout the day  Take medication at the same time each week  Use fiber (like Metamucil) or stool softeners for constipation  Ginger tea or peppermint may help nausea or prescribed medication such as zofran 

## 2023-08-06 ENCOUNTER — Ambulatory Visit: Payer: HMO | Admitting: Family Medicine

## 2023-08-17 ENCOUNTER — Telehealth (HOSPITAL_COMMUNITY): Payer: PPO | Admitting: Psychiatry

## 2023-08-17 ENCOUNTER — Encounter (HOSPITAL_COMMUNITY): Payer: Self-pay | Admitting: Psychiatry

## 2023-08-17 ENCOUNTER — Ambulatory Visit (HOSPITAL_COMMUNITY)

## 2023-08-17 DIAGNOSIS — F3342 Major depressive disorder, recurrent, in full remission: Secondary | ICD-10-CM | POA: Diagnosis not present

## 2023-08-17 MED ORDER — CLONAZEPAM 1 MG PO TABS: 1.0000 mg | ORAL_TABLET | Freq: Every day | ORAL | 5 refills | Status: DC | PRN

## 2023-08-17 MED ORDER — GABAPENTIN 300 MG PO CAPS: 300.0000 mg | ORAL_CAPSULE | Freq: Two times a day (BID) | ORAL | 2 refills | Status: AC

## 2023-08-17 MED ORDER — FLUOXETINE HCL 40 MG PO CAPS: 40.0000 mg | ORAL_CAPSULE | Freq: Every evening | ORAL | 2 refills | Status: AC

## 2023-08-17 MED ORDER — BUPROPION HCL ER (XL) 300 MG PO TB24: 300.0000 mg | ORAL_TABLET | Freq: Every day | ORAL | 2 refills | Status: AC

## 2023-08-17 MED ORDER — FLUOXETINE HCL 20 MG PO CAPS: 20.0000 mg | ORAL_CAPSULE | Freq: Every day | ORAL | 2 refills | Status: DC

## 2023-08-17 NOTE — Progress Notes (Signed)
 Virtual Visit via Video Note  I connected with Rebecca [name] on 08/17/23 at 11:00 AM EDT by a video enabled telemedicine application and verified that I am speaking with the correct person using two identifiers.  Location: Patient: home Provider: office   I discussed the limitations of evaluation and management by telemedicine and the availability of in person appointments. The patient expressed understanding and agreed to proceed.      I discussed the assessment and treatment plan with the patient. The patient was provided an opportunity to ask questions and all were answered. The patient agreed with the plan and demonstrated an understanding of the instructions.   The patient was advised to call back or seek an in-person evaluation if the symptoms worsen or if the condition fails to improve as anticipated.  I provided 20 minutes of non-face-to-face time during this encounter.   Barnie Gull, MD  [facility] MD/PA/NP OP Progress Note  08/17/2023 11:13 AM Rebecca [name]  MRN:  [id number]  Chief Complaint:  Chief Complaint  Patient presents with   Depression   Anxiety   Follow-up   HPI: This patient is a 68 year old separated white female who lives alone in [place].  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  She has 1 daughter and 2 grandchildren.  The patient returns for follow-up about 6 months regarding her depression and anxiety.  She states that lately she has been a little bit more sad although not overtly depressed.  She finds that she has been crying more.  Sometimes she cries when she thinks about her daughter who no longer contacts her.  She states that her last husband who has dementia has been calling her frequently and not making any sense and this makes her sad.  I suggested that we go up a bit on the Prozac  and she agrees.  She is definitely not suicidal.  She is staying active by exercising and spending time with friends  and family.  She is now on Zepbound  which has helped her to lose 40 pounds and she feels really good about it.  She denies any thoughts of self-harm or suicide.  She is sleeping well and only occasionally using the clonazepam  Visit Diagnosis:    ICD-10-CM   1. Recurrent major depressive disorder, in full remission (HCC)  F33.42       Past Psychiatric History: 2 hospitalizations for depression in 2004 and 2015  Past Medical History:  Past Medical History:  Diagnosis Date   Allergy    Anxiety    Arthritis    Asthma    Chronic pain began in 2011 after jooint replacements   Depression    Emphysema of lung (HCC)    GERD (gastroesophageal reflux disease)    Reflux     Past Surgical History:  Procedure Laterality Date   ABDOMINAL HYSTERECTOMY     BREAST BIOPSY Right 06/03/2023   MM RT BREAST BX W LOC DEV 1ST LESION IMAGE BX SPEC STEREO GUIDE 06/03/2023 [facility]   BREAST CYST EXCISION Left    FOOT SURGERY  2011 -both feet   JOINT REPLACEMENT     KNEE SURGERY     left knee   SHOULDER SURGERY  2011    Family Psychiatric History: See below  Family History:  Family History  Problem Relation Age of Onset   Breast cancer Mother    Breast cancer Sister    Bipolar disorder Sister  Breast cancer Maternal Aunt    Breast cancer Maternal Grandmother    Alcohol abuse Maternal Grandfather    Breast cancer Cousin    Deep vein thrombosis Neg Hx    Pulmonary embolism Neg Hx    Heart disease Neg Hx    Heart failure Neg Hx     Social History:  Social History   Socioeconomic History   Marital status: Divorced    Spouse name: Not on file   Number of children: 1   Years of education: Not on file   Highest education level: Associate degree: academic program  Occupational History   Occupation: Retired    Associate Professor: [employer]    Comment: RN  Tobacco Use   Smoking status: Former    Current packs/day: 0.00    Average packs/day: 1 pack/day for 35.0 years (35.0 ttl pk-yrs)     Types: Cigarettes    Start date: 02/1984    Quit date: 02/2019    Years since quitting: 4.5   Smokeless tobacco: Never   Tobacco comments:    not smoking-AH/04/11/2020  Vaping Use   Vaping status: Never Used  Substance and Sexual Activity   Alcohol use: No    Alcohol/week: 2.0 standard drinks of alcohol    Types: 2 Shots of liquor per week   Drug use: No   Sexual activity: Yes  Other Topics Concern   Not on file  Social History Narrative   Not on file   Social Drivers of Health   Financial Resource Strain: Low Risk  (08/05/2023)   Overall Financial Resource Strain (CARDIA)    Difficulty of Paying Living Expenses: Not hard at all  Food Insecurity: No Food Insecurity (08/05/2023)   Hunger Vital Sign    Worried About Running Out of Food in the Last Year: Never true    Ran Out of Food in the Last Year: Never true  Transportation Needs: No Transportation Needs (08/05/2023)   PRAPARE - Administrator, Civil Service (Medical): No    Lack of Transportation (Non-Medical): No  Physical Activity: Sufficiently Active (08/05/2023)   Exercise Vital Sign    Days of Exercise per Week: 4 days    Minutes of Exercise per Session: 60 min  Stress: No Stress Concern Present (08/05/2023)   [employer] of Occupational Health - Occupational Stress Questionnaire    Feeling of Stress: Only a little  Social Connections: Socially Isolated (08/05/2023)   Social Connection and Isolation Panel    Frequency of Communication with Friends and Family: More than three times a week    Frequency of Social Gatherings with Friends and Family: Patient declined    Attends Religious Services: Patient declined    Database administrator or Organizations: No    Attends Engineer, structural: Not on file    Marital Status: Divorced    Allergies:  Allergies  Allergen Reactions   Other Other (See Comments)   Sulfa Antibiotics Hives, Other (See Comments) and Rash    unknown    Metabolic  Disorder Labs: Lab Results  Component Value Date   HGBA1C 5.5 12/17/2022   No results found for: PROLACTIN Lab Results  Component Value Date   CHOL 230 (H) 04/03/2023   TRIG 94 04/03/2023   HDL 91 04/03/2023   CHOLHDL 2.5 04/03/2023   LDLCALC 123 (H) 04/03/2023   LDLCALC 150 (H) 12/17/2022   Lab Results  Component Value Date   TSH 1.680 12/17/2022   TSH 2.286 10/11/2019  Therapeutic Level Labs: No results found for: LITHIUM No results found for: VALPROATE No results found for: CBMZ  Current Medications: Current Outpatient Medications  Medication Sig Dispense Refill   FLUoxetine  (PROZAC ) 20 MG capsule Take 1 capsule (20 mg total) by mouth daily. 90 capsule 2   albuterol  (PROVENTIL ) (2.5 MG/3ML) 0.083% nebulizer solution Take 2.5 mg by nebulization every 4 (four) hours as needed for wheezing or shortness of breath.     albuterol  (VENTOLIN  HFA) 108 (90 Base) MCG/ACT inhaler INHALE 1 OR 2 PUFFS INTO THE LUNGS EVERY 4 TO 6 HOURS AS NEEDED 8.5 g 3   alendronate  (FOSAMAX ) 70 MG tablet Take 1 tablet (70 mg total) by mouth every 7 (seven) days. Take with a full glass of water on an empty stomach. 12 tablet 2   Blood Glucose Monitoring Suppl DEVI 1 each by Does not apply route in the morning, at noon, and at bedtime. May substitute to any manufacturer covered by patient's insurance. 1 each 0   Budeson-Glycopyrrol-Formoterol  (BREZTRI  AEROSPHERE) 160-9-4.8 MCG/ACT AERO Inhale 2 puffs into the lungs in the morning and at bedtime. 32.1 g 3   buPROPion  (WELLBUTRIN  XL) 300 MG 24 hr tablet Take 1 tablet (300 mg total) by mouth daily. 90 tablet 2   CALCIUM  PO Take 1 tablet by mouth daily.     clobetasol ointment (TEMOVATE) 0.05 % Apply 1 Application topically 2 (two) times daily.     clonazePAM  (KLONOPIN ) 1 MG tablet Take 1 tablet (1 mg total) by mouth daily as needed for anxiety. 30 tablet 5   cyanocobalamin  (VITAMIN B12) 1000 MCG tablet Take 1,000 mcg by mouth daily.      fluocinonide ointment (LIDEX) 0.05 % Apply 1 Application topically 4 (four) times daily.     FLUoxetine  (PROZAC ) 40 MG capsule Take 1 capsule (40 mg total) by mouth every evening. 90 capsule 2   furosemide  (LASIX ) 20 MG tablet Take 1 tablet (20 mg total) by mouth every other day. 30 tablet 2   gabapentin  (NEURONTIN ) 300 MG capsule Take 1 capsule (300 mg total) by mouth 2 (two) times daily. 180 capsule 2   Lancets (ONETOUCH DELICA PLUS LANCET33G) MISC Apply 100 each topically 3 (three) times daily.     meloxicam  (MOBIC ) 15 MG tablet TAKE ONE TABLET BY MOUTH ONCE EVERY DAY AS NEEDED 30 tablet 3   ondansetron  (ZOFRAN -ODT) 4 MG disintegrating tablet Take 4 mg by mouth every 8 (eight) hours as needed.     pantoprazole  (PROTONIX ) 40 MG tablet TAKE ONE TABLET BY MOUTH EVERY MORNING 90 tablet 1   pramipexole (MIRAPEX) 0.25 MG tablet TAKE ONE TABLET BY MOUTH 2 TO 3 HOURS BEFORE BEDTIME 90 tablet 1   rosuvastatin  (CRESTOR ) 10 MG tablet Take 1 tablet (10 mg total) by mouth daily. 90 tablet 3   tirzepatide  5 MG/0.5ML injection vial Inject 5 mg into the skin once a week. 2 mL 0   No current facility-administered medications for this visit.     Musculoskeletal: Strength & Muscle Tone: within normal limits Gait & Station: normal Patient leans: N/A  Psychiatric Specialty Exam: Review of Systems  Psychiatric/Behavioral:  Positive for dysphoric mood.   All other systems reviewed and are negative.   There were no vitals taken for this visit.There is no height or weight on file to calculate BMI.  General Appearance: Casual and Fairly Groomed  Eye Contact:  Good  Speech:  Clear and Coherent  Volume:  Normal  Mood:  Euthymic but occasionally more  dysphoric with more crying spells reported  Affect:  Appropriate  Thought Process:  Goal Directed  Orientation:  Full (Time, Place, and Person)  Thought Content: Rumination   Suicidal Thoughts:  No  Homicidal Thoughts:  No  Memory:  Immediate;    Good Recent;   Good Remote;   Good  Judgement:  Good  Insight:  Good  Psychomotor Activity:  Normal  Concentration:  Concentration: Good and Attention Span: Good  Recall:  Good  Fund of Knowledge: Good  Language: Good  Akathisia:  No  Handed:  Right  AIMS (if indicated): not done  Assets:  Communication Skills Desire for Improvement Physical Health Resilience Social Support  ADL's:  Intact  Cognition: WNL  Sleep:  Good   Screenings: AUDIT    Flowsheet Row [facility]  Alcohol Use Disorder Identification Test Final Score (AUDIT) 40   GAD-7    Flowsheet Row Office Visit from 05/28/2023 in [facility] Office Visit from 01/07/2023 in [facility] Office Visit from 12/04/2022 in [facility] Primary Care  Total GAD-7 Score 7 13 4    PHQ2-9    Flowsheet Row Office Visit from 05/28/2023 in [facility] Clinical Support from 03/31/2023 in [facility] Primary Care Office Visit from 01/07/2023 in [facility] Care Office Visit from 12/04/2022 in [facility] Care Video Visit from 09/16/2021 in [facility] at [facility] Total Score 1 0 5 1 0  PHQ-9 Total Score 7 0 15 6 --   Flowsheet Row ED from 04/04/2022 in [facility] Department at [facility] Video Visit from 09/16/2021 in [facility] at [place] Video Visit from 03/18/2021 in [facility]patient [facility]al Health at [place]  C-SSRS RISK CATEGORY No Risk No Risk No Risk     Assessment and Plan: This patient is a 68 year old female with a history of depression and anxiety.  She has been a little bit more dysphoric recently so we will increase Prozac  to 60 mg daily.  She will continue Wellbutrin  XL 300 mg daily also for depression, gabapentin  300 mg twice  daily for anxiety and clonazepam  1 mg at bedtime only as needed for sleep.  She will return to see me in 6 weeks  Collaboration of Care: Collaboration of Care: Primary Care Provider AEB notes that shared with PCP on the epic system  Patient/Guardian was advised Release of Information must be obtained prior to any record release in order to collaborate their care with an outside provider. Patient/Guardian was advised if they have not already done so to contact the registration department to sign all necessary forms in order for us  to release information regarding their care.   Consent: Patient/Guardian gives verbal consent for treatment and assignment of benefits for services provided during this visit. Patient/Guardian expressed understanding and agreed to proceed.    Barnie Gull, MD 08/17/2023, 11:13 AM

## 2023-08-18 ENCOUNTER — Ambulatory Visit (HOSPITAL_COMMUNITY)
Admission: RE | Admit: 2023-08-18 | Discharge: 2023-08-18 | Disposition: A | Discharge status: Home / Self Care | Source: Ambulatory Visit | Attending: Acute Care | Admitting: Acute Care

## 2023-08-18 ENCOUNTER — Other Ambulatory Visit: Payer: Self-pay | Admitting: Family Medicine

## 2023-08-18 DIAGNOSIS — Z87891 Personal history of nicotine dependence: Secondary | ICD-10-CM | POA: Insufficient documentation

## 2023-08-18 DIAGNOSIS — Z122 Encounter for screening for malignant neoplasm of respiratory organs: Secondary | ICD-10-CM | POA: Diagnosis not present

## 2023-08-20 ENCOUNTER — Encounter: Payer: Self-pay | Admitting: Internal Medicine

## 2023-08-25 ENCOUNTER — Telehealth: Payer: Self-pay

## 2023-08-25 NOTE — Telephone Encounter (Signed)
 Returned call. Advised results are still pending.

## 2023-08-27 ENCOUNTER — Other Ambulatory Visit: Payer: Self-pay

## 2023-08-27 ENCOUNTER — Telehealth: Payer: Self-pay | Admitting: Acute Care

## 2023-08-27 DIAGNOSIS — Z122 Encounter for screening for malignant neoplasm of respiratory organs: Secondary | ICD-10-CM

## 2023-08-27 DIAGNOSIS — Z87891 Personal history of nicotine dependence: Secondary | ICD-10-CM

## 2023-08-27 NOTE — Telephone Encounter (Signed)
 Returned call from VM to review results of recent LDCT.  LR2 with small lung nodules. No suspicious findings for lung cancer.  Minimal emphysema.  Plan to repeat LDCT in one year.  Mild spurring in thoracic spine.  Results were sent to mychart this morning and copy to PCP.  Patient had no questions and appreciated the call.

## 2023-09-01 ENCOUNTER — Ambulatory Visit: Admitting: Internal Medicine

## 2023-09-01 NOTE — Progress Notes (Unsigned)
 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  10/11/2019 by Dr   Nanci    Baseline wt 242 summer 07/2017   History of Present Illness  10/11/2019  Pulmonary/ 1st office eval/Roseann Kees  Chief Complaint  Patient presents with   Pulmonary Consult    Referred by Dr. Garnette Senters.  Pt c/o cough with clear sputum in the am's and wheezing x 6 months. She also c/o SOB just about all the time- worse with exertion.   Dyspnea:  HC parking due to arthritis back and L knee one aisle at grocery  Cough: more in am's > thick white  Sleep: bed is flat/ R side  SABA use: ?  Improves p saba - never rechallenges rec Plan A = Automatic = Always=    Breztri  Take 2 puffs first thing in am and then another 2 puffs about 12 hours later.  Plan B = Backup (to supplement plan A, not to replace it) Only use your albuterol  inhaler as a rescue medication Plan C = Crisis (instead of Plan B but only if Plan B stops working) - only use your albuterol  nebulizer if you first try Plan B  Also:  Try albuterol  15 min before an activity that you know would make you short of breath and see if it makes any difference and if makes none then don't take it after activity unless you can't catch your breath.  Try to quit smoking if at all possible    11/24/2019  f/u ov/[place] office/Monik Lins re: AB/still smoking with ERV 22% Chief Complaint  Patient presents with   Follow-up    Breathing has improved since the last visit. She is using her albuterol  inhaler inhaler 3 x per wk and has not used neb.    Dyspnea:  Climbing on yard equipment, mb and back easier  Cough: none  Sleeping: flat bed one pillow R  side fine  SABA use: much less 02: not using  Rec The key is to stop smoking completely before smoking completely stops you! Weight control is simply a matter of calorie balance      09/02/2021  f/u ov/[place] office/Alenah Sarria re: AB maint on Breztri    Chief Complaint  Patient presents with   Follow-up    Breztri  working well.   Dyspnea:  walking to bird feeders 50 uphill s stopping / garage another 25 ft overall easier on brezti  Cough: none Sleeping: on R Side down / bed is flat  3 pillows SABA use: neb 4 x weekly / no hfa  02: none  Covid status: vax up to date  Rec Plan A = Automatic = Always=    Breztri  Take 2 puffs first thing in am and then another 2 puffs about 12 hours later.  Work on inhaler technique:     Plan B = Backup (to supplement plan A, not to replace it) Only use your albuterol  inhaler as a rescue medication Plan C = Crisis (instead of Plan B but only if Plan B stops working) - only use your albuterol  nebulizer if you first try Plan B Ok to try albuterol  15  min before an activity (on alternating days)  that you know would usually make you short of breath      09/01/2022  12 m f/u ov/[place] office/Janaki Exley re: AB/MO maint on Breztri  2bid Chief Complaint  Patient presents with   Asthmatic bronchitis, mild persistent, uncomplicated  Dyspnea:  no change -  considering joining Y  Cough: none  Sleeping: bed is flat only one pillow under head  SABA use: rarely  02: none  Rec Water aerobics really good choice for you  Ok to leave off pm breztri  if doing great with night time symptoms and minimal need for albuterol   Please schedule a follow up visit in 12  months but call sooner if needed      09/02/2023  Yearly f/u ov/[place] office/Wilmarie Sparlin re: AB maint on breztri    Chief Complaint  Patient presents with   Follow-up   Asthma  Dyspnea:  walks around farm/ 1.15 min elipticals  Cough: none  Sleeping: flate one bed one pillow s    resp cc  SABA use: rarely  hfa/ nev > 1 years  02: none   Lung cancer screening: 08/18/23  Rads 2 mild emphysema    No obvious day to day or daytime variability or assoc excess/ purulent sputum or mucus plugs or hemoptysis or cp or chest tightness, subjective wheeze or overt sinus or hb symptoms.    Also denies any obvious fluctuation of symptoms with weather or environmental changes or other aggravating or alleviating factors except as outlined above   No unusual exposure hx or h/o childhood pna/ asthma or knowledge of premature birth.  Current Allergies, Complete Past Medical History, Past Surgical History, Family History, and Social History were reviewed in [employer] record.  ROS  The following are not active complaints unless bolded Hoarseness, sore throat, dysphagia, dental problems, itching, sneezing,  nasal congestion or discharge of excess mucus or purulent secretions, ear ache,   fever, chills, sweats, unintended wt loss or wt gain, classically pleuritic or exertional cp,  orthopnea pnd or arm/hand swelling  or leg swelling, presyncope, palpitations, abdominal pain, anorexia/drug induced, nausea, vomiting, diarrhea  or change in bowel habits or change in bladder habits, change in stools or change in urine, dysuria, hematuria,  rash, arthralgias, visual complaints, headache, numbness, weakness or ataxia or problems with walking or coordination,  change in mood or  memory.        Current Meds  Medication Sig   albuterol  (PROVENTIL ) (2.5 MG/3ML) 0.083% nebulizer solution Take 2.5 mg by nebulization every 4 (four) hours as needed for wheezing or shortness of breath.   albuterol  (VENTOLIN  HFA) 108 (90 Base) MCG/ACT inhaler INHALE 1 OR 2 PUFFS INTO THE LUNGS EVERY 4 TO 6 HOURS AS NEEDED   alendronate  (FOSAMAX ) 70 MG tablet Take 1 tablet (70 mg total) by mouth every 7 (seven) days. Take with a full glass of water on an empty stomach.   Blood Glucose Monitoring Suppl DEVI 1 each by Does not  apply route in the morning, at noon, and at bedtime. May substitute to any manufacturer covered by patient's insurance.   Budeson-Glycopyrrol-Formoterol  (BREZTRI  AEROSPHERE) 160-9-4.8 MCG/ACT AERO Inhale 2 puffs into the lungs in the morning and at bedtime.   buPROPion  (WELLBUTRIN  XL) 300 MG 24 hr tablet Take 1 tablet (300 mg total) by mouth daily.   CALCIUM  PO Take 1 tablet by mouth daily.   clonazePAM  (KLONOPIN ) 1 MG tablet Take 1 tablet (1 mg total) by mouth daily  as needed for anxiety.   cyanocobalamin  (VITAMIN B12) 1000 MCG tablet Take 1,000 mcg by mouth daily.   FLUoxetine  (PROZAC ) 20 MG capsule Take 1 capsule (20 mg total) by mouth daily.   FLUoxetine  (PROZAC ) 40 MG capsule Take 1 capsule (40 mg total) by mouth every evening.   furosemide  (LASIX ) 20 MG tablet Take 1 tablet (20 mg total) by mouth every other day.   gabapentin  (NEURONTIN ) 300 MG capsule Take 1 capsule (300 mg total) by mouth 2 (two) times daily.   Lancets (ONETOUCH DELICA PLUS LANCET33G) MISC Apply 100 each topically 3 (three) times daily.   meloxicam  (MOBIC ) 15 MG tablet TAKE ONE TABLET BY MOUTH ONCE EVERY DAY AS NEEDED   ondansetron  (ZOFRAN -ODT) 4 MG disintegrating tablet Take 4 mg by mouth every 8 (eight) hours as needed.   pantoprazole  (PROTONIX ) 40 MG tablet TAKE ONE TABLET BY MOUTH EVERY MORNING   pramipexole (MIRAPEX) 0.25 MG tablet TAKE ONE TABLET BY MOUTH 2 TO 3 HOURS BEFORE BEDTIME   rosuvastatin  (CRESTOR ) 10 MG tablet Take 1 tablet (10 mg total) by mouth daily.   tirzepatide  7.5 MG/0.5ML injection vial Inject 7.5 mg into the skin once a week.           Objective:    Wts  09/02/2023       254  09/01/2022       291  09/02/2021       273   11/24/19 261 lb (118.4 kg)  10/11/19 263 lb (119.3 kg)  07/05/19 259 lb (117.5 kg)    Vital signs reviewed  09/02/2023  - Note at rest 02 sats  96% on RA   General appearance:    amb M0( by BMI)    HEENT : Oropharynx  clear      Nasal turbinates nl    NECK :  without   apparent JVD/ palpable Nodes/TM    LUNGS: no acc muscle use,  Nl contour chest which is clear to A and P bilaterally without cough on insp or exp maneuvers   CV:  RRR  no s3 or murmur or increase in P2, and no edema   ABD:  obese soft and nontender   MS:  Gait nl   ext warm without deformities Or obvious joint restrictions  calf tenderness, cyanosis or clubbing    SKIN: warm and dry without lesions    NEURO:  alert, approp, nl sensorium with  no motor or cerebellar deficits apparent.         Assessment

## 2023-09-02 ENCOUNTER — Ambulatory Visit: Admitting: Internal Medicine

## 2023-09-02 ENCOUNTER — Encounter: Payer: Self-pay | Admitting: Internal Medicine

## 2023-09-02 VITALS — BP 131/79 | HR 84 | Ht 63.0 in | Wt 254.8 lb

## 2023-09-02 DIAGNOSIS — J4530 Mild persistent asthma, uncomplicated: Secondary | ICD-10-CM | POA: Diagnosis not present

## 2023-09-02 DIAGNOSIS — Z6841 Body Mass Index (BMI) 40.0 and over, adult: Secondary | ICD-10-CM | POA: Diagnosis not present

## 2023-09-02 NOTE — Assessment & Plan Note (Signed)
 Quit smoking 2021  - 10/11/2019  After extensive coaching inhaler device,  effectiveness =    75% (short Ti)  > try breztri  2bid  - Allergy profile 10/11/2019 >  Eos 0.5 /  IgE  121 > 10/13/2019 rec add singulair  - alpha one AT  10/11/19   MM   158  - PFT's  11/07/2019    FEV1 1.69 (68 % ) ratio 0.79  p 29 % improvement from saba p 0 prior to study with DLCO  19.99 (94%) corrects to 4.23 (126%)  for alv volume and FV curve min concave  And ERV 22%  > see AB  - LDSCT  06/18/21 mild  emphysema   Group D (now reclassified as E) in terms of symptom/risk and laba/lama/ICS  therefore appropriate rx at this point >>>  breztri   - ok to wean to q am for cost if needed but for now doing great on the samples provided by [facility] > no chang rx/ f/u in 12 m

## 2023-09-02 NOTE — Assessment & Plan Note (Signed)
 ERV 22% by PFTs  11/07/19   Lost almost 40 lb on tirzepatide  injections but still BMI > 80  Body mass index is 45.14 kg/m.  -  trending down/ reinforced Lab Results  Component Value Date   TSH 1.680 12/17/2022      Contributing to doe and risk of GERD/dvt/PE  >>>   reviewed the need and the process to achieve and maintain neg calorie balance > defer f/u primary care including intermittently monitoring thyroid  status          F/u yearly, sooner prn  Each maintenance medication was reviewed in detail including emphasizing most importantly the difference between maintenance and prns and under what circumstances the prns are to be triggered using an action plan format where appropriate.  Total time for H and P, chart review, counseling, reviewing hfa device(s) and generating customized AVS unique to this office visit / same day charting = 25 min annual eval

## 2023-09-02 NOTE — Patient Instructions (Addendum)
 No change in medications   Please schedule a follow up visit in 12  months,  but call sooner if needed - bring inhalers

## 2023-09-10 ENCOUNTER — Other Ambulatory Visit (HOSPITAL_COMMUNITY): Payer: Self-pay

## 2023-09-10 ENCOUNTER — Encounter (HOSPITAL_COMMUNITY): Payer: Self-pay

## 2023-09-10 ENCOUNTER — Encounter: Payer: Self-pay | Admitting: Family Medicine

## 2023-09-10 ENCOUNTER — Other Ambulatory Visit: Payer: Self-pay | Admitting: Family Medicine

## 2023-09-10 MED ORDER — ONDANSETRON HCL 4 MG PO TABS: 4.0000 mg | ORAL_TABLET | Freq: Three times a day (TID) | ORAL | 1 refills | Status: DC | PRN

## 2023-09-10 NOTE — Telephone Encounter (Signed)
 Okay, thanks for telling me

## 2023-09-10 NOTE — Telephone Encounter (Signed)
 sent

## 2023-09-14 ENCOUNTER — Other Ambulatory Visit: Payer: Self-pay | Admitting: Family Medicine

## 2023-09-15 ENCOUNTER — Other Ambulatory Visit: Payer: Self-pay | Admitting: Family Medicine

## 2023-09-15 MED ORDER — TIRZEPATIDE-WEIGHT MANAGEMENT 10 MG/0.5ML ~~LOC~~ SOLN: 10.0000 mg | SUBCUTANEOUS | 0 refills | Status: DC

## 2023-09-22 ENCOUNTER — Telehealth (HOSPITAL_COMMUNITY): Admitting: Psychiatry

## 2023-09-23 ENCOUNTER — Other Ambulatory Visit: Payer: Self-pay | Admitting: Family Medicine

## 2023-09-24 ENCOUNTER — Telehealth (INDEPENDENT_AMBULATORY_CARE_PROVIDER_SITE_OTHER): Admitting: Psychiatry

## 2023-09-24 ENCOUNTER — Encounter (HOSPITAL_COMMUNITY): Payer: Self-pay | Admitting: Psychiatry

## 2023-09-24 DIAGNOSIS — F3342 Major depressive disorder, recurrent, in full remission: Secondary | ICD-10-CM

## 2023-09-24 NOTE — Progress Notes (Signed)
 Virtual Visit via Video Note  I connected with Rebecca [name] on 09/24/23 at  1:40 PM EDT by a video enabled telemedicine application and verified that I am speaking with the correct person using two identifiers.  Location: Patient: home Provider: office   I discussed the limitations of evaluation and management by telemedicine and the availability of in person appointments. The patient expressed understanding and agreed to proceed.     I discussed the assessment and treatment plan with the patient. The patient was provided an opportunity to ask questions and all were answered. The patient agreed with the plan and demonstrated an understanding of the instructions.   The patient was advised to call back or seek an in-person evaluation if the symptoms worsen or if the condition fails to improve as anticipated.  I provided 20 minutes of non-face-to-face time during this encounter.   Barnie Gull, MD  [facility] MD/PA/NP OP Progress Note  09/24/2023 1:50 PM KIRA HARTL  MRN:  [id number]  Chief Complaint:  Chief Complaint  Patient presents with   Anxiety   Depression   Follow-up   HPI: This patient is a 68 year old separated white female who lives alone in [place]. 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 She has 1 daughter and 2 grandchildren   The patient returns for follow-up after 4 weeks regarding her depression and anxiety.  Last time she stated she was more depressed and crying more.  She stated her ex-husband who has dementia have been calling her and not making any sense.  We tried increasing her Prozac  to 60 mg.  However she sent me a message stating it made her too agitated and nervous so she is dropped back to 40 mg.  Nevertheless she is feeling much better now.  Her husband's brother has blocked him from using the phone to call her and she is feeling much less stressed.  She is eating and sleeping well and her outlook is  bright and she is spending time with friends and family.  She remains on Zepbound  and she continues to lose weight. Visit Diagnosis:    ICD-10-CM   1. Recurrent major depressive disorder, in full remission (HCC)  F33.42       Past Psychiatric History: 2 hospitalizations for depression in 2004 and 2015  Past Medical History:  Past Medical History:  Diagnosis Date   Allergy    Anxiety    Arthritis    Asthma    Chronic pain began in 2011 after jooint replacements   Depression    Emphysema of lung (HCC)    GERD (gastroesophageal reflux disease)    Reflux     Past Surgical History:  Procedure Laterality Date   ABDOMINAL HYSTERECTOMY     BREAST BIOPSY Right 06/03/2023   MM RT BREAST BX W LOC DEV 1ST LESION IMAGE BX SPEC STEREO GUIDE 06/03/2023 [facility]   BREAST CYST EXCISION Left    FOOT SURGERY  2011 -both feet   JOINT REPLACEMENT     KNEE SURGERY     left knee   SHOULDER SURGERY  2011    Family Psychiatric History: See below  Family History:  Family History  Problem Relation Age of Onset   Breast cancer Mother    Breast cancer Sister    Bipolar disorder Sister    Breast cancer Maternal Aunt    Breast cancer Maternal Grandmother    Alcohol abuse Maternal Grandfather  Breast cancer Cousin    Deep vein thrombosis Neg Hx    Pulmonary embolism Neg Hx    Heart disease Neg Hx    Heart failure Neg Hx     Social History:  Social History   Socioeconomic History   Marital status: Divorced    Spouse name: Not on file   Number of children: 1   Years of education: Not on file   Highest education level: Associate degree: academic program  Occupational History   Occupation: Retired    Associate Professor: [employer]    Comment: RN  Tobacco Use   Smoking status: Former    Current packs/day: 0.00    Average packs/day: 1 pack/day for 35.0 years (35.0 ttl pk-yrs)    Types: Cigarettes    Start date: 02/1984    Quit date: 02/2019    Years since quitting: 4.6    Smokeless tobacco: Never   Tobacco comments:    not smoking-AH/04/11/2020  Vaping Use   Vaping status: Never Used  Substance and Sexual Activity   Alcohol use: No    Alcohol/week: 2.0 standard drinks of alcohol    Types: 2 Shots of liquor per week   Drug use: No   Sexual activity: Yes  Other Topics Concern   Not on file  Social History Narrative   Not on file   Social Drivers of Health   Financial Resource Strain: Low Risk  (08/05/2023)   Overall Financial Resource Strain (CARDIA)    Difficulty of Paying Living Expenses: Not hard at all  Food Insecurity: No Food Insecurity (08/05/2023)   Hunger Vital Sign    Worried About Running Out of Food in the Last Year: Never true    Ran Out of Food in the Last Year: Never true  Transportation Needs: No Transportation Needs (08/05/2023)   PRAPARE - Administrator, Civil Service (Medical): No    Lack of Transportation (Non-Medical): No  Physical Activity: Sufficiently Active (08/05/2023)   Exercise Vital Sign    Days of Exercise per Week: 4 days    Minutes of Exercise per Session: 60 min  Stress: No Stress Concern Present (08/05/2023)   [employer] of Occupational Health - Occupational Stress Questionnaire    Feeling of Stress: Only a little  Social Connections: Socially Isolated (08/05/2023)   Social Connection and Isolation Panel    Frequency of Communication with Friends and Family: More than three times a week    Frequency of Social Gatherings with Friends and Family: Patient declined    Attends Religious Services: Patient declined    Database administrator or Organizations: No    Attends Engineer, structural: Not on file    Marital Status: Divorced    Allergies:  Allergies  Allergen Reactions   Other Other (See Comments)   Sulfa Antibiotics Hives, Other (See Comments) and Rash    unknown    Metabolic Disorder Labs: Lab Results  Component Value Date   HGBA1C 5.5 12/17/2022   No results found for:  PROLACTIN Lab Results  Component Value Date   CHOL 230 (H) 04/03/2023   TRIG 94 04/03/2023   HDL 91 04/03/2023   CHOLHDL 2.5 04/03/2023   LDLCALC 123 (H) 04/03/2023   LDLCALC 150 (H) 12/17/2022   Lab Results  Component Value Date   TSH 1.680 12/17/2022   TSH 2.286 10/11/2019    Therapeutic Level Labs: No results found for: LITHIUM No results found for: VALPROATE No results found for: CBMZ  Current Medications: Current Outpatient Medications  Medication Sig Dispense Refill   albuterol  (PROVENTIL ) (2.5 MG/3ML) 0.083% nebulizer solution Take 2.5 mg by nebulization every 4 (four) hours as needed for wheezing or shortness of breath.     albuterol  (VENTOLIN  HFA) 108 (90 Base) MCG/ACT inhaler INHALE 1 OR 2 PUFFS INTO THE LUNGS EVERY 4 TO 6 HOURS AS NEEDED 8.5 g 3   alendronate  (FOSAMAX ) 70 MG tablet Take 1 tablet (70 mg total) by mouth every 7 (seven) days. Take with a full glass of water on an empty stomach. 12 tablet 2   Blood Glucose Monitoring Suppl DEVI 1 each by Does not apply route in the morning, at noon, and at bedtime. May substitute to any manufacturer covered by patient's insurance. 1 each 0   Budeson-Glycopyrrol-Formoterol  (BREZTRI  AEROSPHERE) 160-9-4.8 MCG/ACT AERO Inhale 2 puffs into the lungs in the morning and at bedtime. 32.1 g 3   buPROPion  (WELLBUTRIN  XL) 300 MG 24 hr tablet Take 1 tablet (300 mg total) by mouth daily. 90 tablet 2   CALCIUM  PO Take 1 tablet by mouth daily.     clonazePAM  (KLONOPIN ) 1 MG tablet Take 1 tablet (1 mg total) by mouth daily as needed for anxiety. 30 tablet 5   cyanocobalamin  (VITAMIN B12) 1000 MCG tablet Take 1,000 mcg by mouth daily.     FLUoxetine  (PROZAC ) 40 MG capsule Take 1 capsule (40 mg total) by mouth every evening. 90 capsule 2   furosemide  (LASIX ) 20 MG tablet Take 1 tablet (20 mg total) by mouth every other day. 30 tablet 2   gabapentin  (NEURONTIN ) 300 MG capsule Take 1 capsule (300 mg total) by mouth 2 (two) times daily.  180 capsule 2   Lancets (ONETOUCH DELICA PLUS LANCET33G) MISC Apply 100 each topically 3 (three) times daily.     meloxicam  (MOBIC ) 15 MG tablet TAKE ONE TABLET BY MOUTH ONCE EVERY DAY AS NEEDED 30 tablet 3   ondansetron  (ZOFRAN ) 4 MG tablet Take 1 tablet (4 mg total) by mouth every 8 (eight) hours as needed for nausea or vomiting. 20 tablet 1   pantoprazole  (PROTONIX ) 40 MG tablet TAKE ONE TABLET BY MOUTH EVERY MORNING 90 tablet 1   pramipexole (MIRAPEX) 0.25 MG tablet TAKE ONE TABLET BY MOUTH 2 TO 3 HOURS BEFORE BEDTIME 90 tablet 1   rosuvastatin  (CRESTOR ) 10 MG tablet Take 1 tablet (10 mg total) by mouth daily. 90 tablet 3   tirzepatide  10 MG/0.5ML injection vial Inject 10 mg into the skin once a week. 2 mL 0   No current facility-administered medications for this visit.     Musculoskeletal: Strength & Muscle Tone: within normal limits Gait & Station: normal Patient leans: N/A  Psychiatric Specialty Exam: Review of Systems  All other systems reviewed and are negative.   There were no vitals taken for this visit.There is no height or weight on file to calculate BMI.  General Appearance: Casual and Fairly Groomed  Eye Contact:  Good  Speech:  Clear and Coherent  Volume:  Normal  Mood:  Euthymic  Affect:  Congruent  Thought Process:  Goal Directed  Orientation:  Full (Time, Place, and Person)  Thought Content: WDL   Suicidal Thoughts:  No  Homicidal Thoughts:  No  Memory:  Immediate;   Good Recent;   Good Remote;   Fair  Judgement:  Good  Insight:  Good  Psychomotor Activity:  Normal  Concentration:  Concentration: Good and Attention Span: Good  Recall:  Good  Fund  of Knowledge: Good  Language: Good  Akathisia:  No  Handed:  Right  AIMS (if indicated): not done  Assets:  Communication Skills Desire for Improvement Physical Health Resilience Social Support Talents/Skills  ADL's:  Intact  Cognition: WNL  Sleep:  Good   Screenings: AUDIT    Flowsheet Row  [facility]  Alcohol Use Disorder Identification Test Final Score (AUDIT) 40   GAD-7    Flowsheet Row Office Visit from 05/28/2023 in [facility] Office Visit from 01/07/2023 in [facility] Office Visit from 12/04/2022 in [facility]  Total GAD-7 Score 7 13 4    PHQ2-9    Flowsheet Row Office Visit from 05/28/2023 in [facility] Clinical Support from 03/31/2023 in [facility] Office Visit from 01/07/2023 in [facility] Care Office Visit from 12/04/2022 in [facility] Primary Care Video Visit from 09/16/2021 in [facility] at [facility] Total Score 1 0 5 1 0  PHQ-9 Total Score 7 0 15 6 --   Flowsheet Row ED from 04/04/2022 in [facility] Emergency Department at [facility] Video Visit from 09/16/2021 in [facility] at [place] Video Visit from 03/18/2021 in [facility] Outpatient Behavioral Health at [place]  C-SSRS RISK CATEGORY No Risk No Risk No Risk     Assessment and Plan: This patient is a 68 year old female with a history of depression and anxiety.  She is doing well on her current regimen.  She will continue Prozac  40 mg daily as well as Wellbutrin  XL 300 mg daily for depression, gabapentin  300 mg twice daily for anxiety and clonazepam  1 mg at bedtime only as needed for sleep.  She will return to see me in 6 months  Collaboration of Care: Collaboration of Care: Primary Care Provider AEB notes that shared with PCP on the epic system  Patient/Guardian was advised Release of Information must be obtained prior to any record release in order to collaborate their care with an outside provider. Patient/Guardian was advised if they have not already done so to contact the registration department to sign all  necessary forms in order for us  to release information regarding their care.   Consent: Patient/Guardian gives verbal consent for treatment and assignment of benefits for services provided during this visit. Patient/Guardian expressed understanding and agreed to proceed.    Barnie Gull, MD 09/24/2023, 1:50 PM

## 2023-09-29 ENCOUNTER — Telehealth (HOSPITAL_COMMUNITY): Admitting: Psychiatry

## 2023-10-01 ENCOUNTER — Encounter: Payer: Self-pay | Admitting: Internal Medicine

## 2023-10-02 ENCOUNTER — Other Ambulatory Visit: Payer: Self-pay

## 2023-10-02 MED ORDER — BREZTRI AEROSPHERE 160-9-4.8 MCG/ACT IN AERO: 2.0000 | INHALATION_SPRAY | Freq: Two times a day (BID) | RESPIRATORY_TRACT | 3 refills | Status: AC

## 2023-10-08 ENCOUNTER — Encounter: Payer: Self-pay | Admitting: Family Medicine

## 2023-10-08 ENCOUNTER — Ambulatory Visit: Admitting: Family Medicine

## 2023-10-08 VITALS — BP 122/72 | HR 90 | Resp 16 | Ht 63.0 in | Wt 244.0 lb

## 2023-10-08 DIAGNOSIS — E66813 Obesity, class 3: Secondary | ICD-10-CM

## 2023-10-08 DIAGNOSIS — Z6841 Body Mass Index (BMI) 40.0 and over, adult: Secondary | ICD-10-CM | POA: Diagnosis not present

## 2023-10-08 DIAGNOSIS — R7301 Impaired fasting glucose: Secondary | ICD-10-CM

## 2023-10-08 DIAGNOSIS — E038 Other specified hypothyroidism: Secondary | ICD-10-CM

## 2023-10-08 DIAGNOSIS — E782 Mixed hyperlipidemia: Secondary | ICD-10-CM | POA: Diagnosis not present

## 2023-10-08 DIAGNOSIS — E559 Vitamin D deficiency, unspecified: Secondary | ICD-10-CM

## 2023-10-08 DIAGNOSIS — E538 Deficiency of other specified B group vitamins: Secondary | ICD-10-CM | POA: Diagnosis not present

## 2023-10-08 DIAGNOSIS — E785 Hyperlipidemia, unspecified: Secondary | ICD-10-CM | POA: Insufficient documentation

## 2023-10-08 DIAGNOSIS — Z23 Encounter for immunization: Secondary | ICD-10-CM

## 2023-10-08 NOTE — Patient Instructions (Signed)

## 2023-10-08 NOTE — Assessment & Plan Note (Signed)
 Previous LDL 123 Lipid Panel ordered today Patient reports taking Crestor  10 mg once daily

## 2023-10-08 NOTE — Assessment & Plan Note (Signed)
 The patient is currently receiving weekly Zepbound  injections and has experienced a weight loss of 34 pounds since the last visit. Discussed the importance to start eating 3 meals a day including breakfast, drink 8 glasses of water a day ,reduce portion sizes. reduced carbohydrates limit saturated and trans fat, increase servings of vegetables and limit processed foods. Find an activity that you will enjoy and start to be active at least 5 days a week for 30 minutes each day. Keep a food journal or an activity journal to identify triggers that lead to emotional eating

## 2023-10-08 NOTE — Progress Notes (Signed)
 Established Patient Office Visit   Subjective  Patient ID: Rebecca [name], female    DOB: October 21, 1955  Age: 68 y.o. MRN: [id number]  Chief Complaint  Patient presents with   Follow-up    She  has a past medical history of Allergy, Anxiety, Arthritis, Asthma, Chronic pain (began in 2011 after jooint replacements), Depression, Emphysema of lung (HCC), GERD (gastroesophageal reflux disease), and Reflux.  HPI Patient presents to the clinic for  chronic follow up. For the details of today's visit, please refer to assessment and plan.  Review of Systems  Constitutional:  Negative for fever.  Eyes:  Negative for blurred vision.  Respiratory:  Negative for shortness of breath.   Cardiovascular:  Negative for chest pain.  Gastrointestinal:  Positive for nausea.  Genitourinary:  Negative for dysuria.  Neurological:  Negative for dizziness and headaches.      Objective:     BP 122/72   Pulse 90   Resp 16   Ht 5' 3 (1.6 m)   Wt 244 lb (110.7 kg)   SpO2 90%   BMI 43.22 kg/m  BP Readings from Last 3 Encounters:  10/08/23 122/72  09/02/23 131/79  05/28/23 119/74      Physical Exam Vitals reviewed.  Constitutional:      General: She is not in acute distress.    Appearance: Normal appearance. She is not ill-appearing, toxic-appearing or diaphoretic.  HENT:     Head: Normocephalic.  Eyes:     General:        Right eye: No discharge.        Left eye: No discharge.     Conjunctiva/sclera: Conjunctivae normal.  Cardiovascular:     Rate and Rhythm: Normal rate.     Pulses: Normal pulses.     Heart sounds: Normal heart sounds.  Pulmonary:     Effort: Pulmonary effort is normal. No respiratory distress.     Breath sounds: Normal breath sounds.  Abdominal:     General: Bowel sounds are normal.     Palpations: Abdomen is soft.     Tenderness: There is no abdominal tenderness. There is no guarding.  Skin:    General: Skin is warm and dry.  Neurological:     Mental  Status: She is alert.  Psychiatric:        Mood and Affect: Mood normal.        Behavior: Behavior normal.      No results found for any visits on 10/08/23.  The 10-year ASCVD risk score (Arnett DK, et al., 2019) is: 5.7%    Assessment & Plan:  TSH (thyroid -stimulating hormone deficiency) -     TSH + free T4  IFG (impaired fasting glucose) -     Hemoglobin A1c  Vitamin D  deficiency -     VITAMIN D  25 Hydroxy (Vit-D Deficiency, Fractures)  Vitamin B12 deficiency -     Vitamin B12  Mixed hyperlipidemia Assessment & Plan: Previous LDL 123 Lipid Panel ordered today Patient reports taking Crestor  10 mg once daily      Orders: -     BMP8+eGFR -     Lipid panel -     CBC with Differential/Platelet  Encounter for immunization -     Flu vaccine HIGH DOSE PF(Fluzone Trivalent)  Class 3 severe obesity due to excess calories with serious comorbidity and body mass index (BMI) of 40.0 to 44.9 in adult Assessment & Plan: The patient is currently receiving weekly Zepbound  injections and has  experienced a weight loss of 34 pounds since the last visit. Discussed the importance to start eating 3 meals a day including breakfast, drink 8 glasses of water a day ,reduce portion sizes. reduced carbohydrates limit saturated and trans fat, increase servings of vegetables and limit processed foods. Find an activity that you will enjoy and start to be active at least 5 days a week for 30 minutes each day. Keep a food journal or an activity journal to identify triggers that lead to emotional eating      Return in about 6 months (around 04/06/2024), or if symptoms worsen or fail to improve, for chronic follow-up.   Hilario Kidd Wilhelmena Falter, FNP

## 2023-10-09 ENCOUNTER — Ambulatory Visit: Payer: Self-pay | Admitting: Family Medicine

## 2023-10-09 LAB — VITAMIN D 25 HYDROXY (VIT D DEFICIENCY, FRACTURES): Vit D, 25-Hydroxy: 38 ng/mL (ref 30.0–100.0)

## 2023-10-09 LAB — CBC WITH DIFFERENTIAL/PLATELET
Basophils Absolute: 0.2 x10E3/uL (ref 0.0–0.2)
Basos: 2 %
EOS (ABSOLUTE): 0.4 x10E3/uL (ref 0.0–0.4)
Eos: 5 %
Hematocrit: 41.7 % (ref 34.0–46.6)
Hemoglobin: 13.2 g/dL (ref 11.1–15.9)
Immature Grans (Abs): 0 x10E3/uL (ref 0.0–0.1)
Immature Granulocytes: 0 %
Lymphocytes Absolute: 1.7 x10E3/uL (ref 0.7–3.1)
Lymphs: 25 %
MCH: 27.8 pg (ref 26.6–33.0)
MCHC: 31.7 g/dL (ref 31.5–35.7)
MCV: 88 fL (ref 79–97)
Monocytes Absolute: 0.5 x10E3/uL (ref 0.1–0.9)
Monocytes: 7 %
Neutrophils Absolute: 4.2 x10E3/uL (ref 1.4–7.0)
Neutrophils: 61 %
Platelets: 376 x10E3/uL (ref 150–450)
RBC: 4.75 x10E6/uL (ref 3.77–5.28)
RDW: 15 % (ref 11.7–15.4)
WBC: 7 x10E3/uL (ref 3.4–10.8)

## 2023-10-09 LAB — BMP8+EGFR
BUN/Creatinine Ratio: 12 (ref 12–28)
BUN: 10 mg/dL (ref 8–27)
CO2: 22 mmol/L (ref 20–29)
Calcium: 9.3 mg/dL (ref 8.7–10.3)
Chloride: 102 mmol/L (ref 96–106)
Creatinine, Ser: 0.85 mg/dL (ref 0.57–1.00)
Glucose: 85 mg/dL (ref 70–99)
Potassium: 4.4 mmol/L (ref 3.5–5.2)
Sodium: 140 mmol/L (ref 134–144)
eGFR: 75 mL/min/1.73 (ref >59)

## 2023-10-09 LAB — TSH+FREE T4
Free T4: 1.2 ng/dL (ref 0.82–1.77)
TSH: 0.915 u[IU]/mL (ref 0.450–4.500)

## 2023-10-09 LAB — HEMOGLOBIN A1C
Est. average glucose Bld gHb Est-mCnc: 97 mg/dL
Hgb A1c MFr Bld: 5 % (ref 4.8–5.6)

## 2023-10-09 LAB — LIPID PANEL
Chol/HDL Ratio: 2 ratio (ref 0.0–4.4)
Cholesterol, Total: 162 mg/dL (ref 100–199)
HDL: 81 mg/dL (ref >39)
LDL Chol Calc (NIH): 66 mg/dL (ref 0–99)
Triglycerides: 81 mg/dL (ref 0–149)
VLDL Cholesterol Cal: 15 mg/dL (ref 5–40)

## 2023-10-13 ENCOUNTER — Other Ambulatory Visit: Payer: Self-pay | Admitting: Family Medicine

## 2023-10-21 ENCOUNTER — Encounter: Payer: Self-pay | Admitting: Family Medicine

## 2023-10-21 ENCOUNTER — Other Ambulatory Visit: Payer: Self-pay | Admitting: Family Medicine

## 2023-10-21 MED ORDER — TIRZEPATIDE-WEIGHT MANAGEMENT 10 MG/0.5ML ~~LOC~~ SOLN: 10.0000 mg | SUBCUTANEOUS | 0 refills | Status: DC

## 2023-10-21 NOTE — Telephone Encounter (Signed)
 sent

## 2023-10-26 ENCOUNTER — Other Ambulatory Visit: Payer: Self-pay | Admitting: Family Medicine

## 2023-11-13 ENCOUNTER — Other Ambulatory Visit: Payer: Self-pay | Admitting: Family Medicine

## 2023-11-14 ENCOUNTER — Encounter: Payer: Self-pay | Admitting: Family Medicine

## 2023-11-16 ENCOUNTER — Encounter: Payer: Self-pay | Admitting: Family Medicine

## 2023-11-16 ENCOUNTER — Other Ambulatory Visit: Payer: Self-pay

## 2023-11-18 ENCOUNTER — Other Ambulatory Visit: Payer: Self-pay

## 2023-11-19 ENCOUNTER — Telehealth: Payer: Self-pay | Admitting: Family Medicine

## 2023-11-19 ENCOUNTER — Other Ambulatory Visit: Payer: Self-pay

## 2023-11-19 DIAGNOSIS — Z6841 Body Mass Index (BMI) 40.0 and over, adult: Secondary | ICD-10-CM

## 2023-11-19 MED ORDER — ZEPBOUND 12.5 MG/0.5ML ~~LOC~~ SOAJ: 12.5000 mg | SUBCUTANEOUS | 1 refills | Status: DC

## 2023-11-19 NOTE — Telephone Encounter (Signed)
 Copied from CRM #[id number]. Topic: Clinical - Prescription Issue >> Nov 19, 2023  2:33 PM Turkey B wrote: Reason for CRM: Patient called in says, Zepbound  was sent in as auto injections instead of vials that she is requesting

## 2023-11-19 NOTE — Telephone Encounter (Signed)
 Zepbound  12.5 mg sent to [facility] direct pharmacy

## 2023-11-20 ENCOUNTER — Other Ambulatory Visit: Payer: Self-pay

## 2023-11-20 DIAGNOSIS — E66813 Obesity, class 3: Secondary | ICD-10-CM

## 2023-11-20 MED ORDER — ZEPBOUND 12.5 MG/0.5ML ~~LOC~~ SOLN: 0.5000 mL | SUBCUTANEOUS | 5 refills | Status: DC

## 2023-11-20 NOTE — Telephone Encounter (Signed)
 done

## 2023-11-27 ENCOUNTER — Other Ambulatory Visit: Payer: Self-pay

## 2023-12-18 ENCOUNTER — Other Ambulatory Visit: Payer: Self-pay | Admitting: Family Medicine

## 2023-12-18 ENCOUNTER — Other Ambulatory Visit: Payer: Self-pay

## 2023-12-18 DIAGNOSIS — E66813 Obesity, class 3: Secondary | ICD-10-CM

## 2023-12-18 MED ORDER — ZEPBOUND 15 MG/0.5ML ~~LOC~~ SOLN: 0.5000 mL | SUBCUTANEOUS | 2 refills | Status: AC

## 2023-12-18 MED ORDER — ZEPBOUND 12.5 MG/0.5ML ~~LOC~~ SOLN: 0.5000 mL | SUBCUTANEOUS | 5 refills | Status: DC

## 2023-12-18 NOTE — Telephone Encounter (Signed)
 Copied from CRM #[id number]. Topic: Clinical - Medication Refill >> Dec 18, 2023 12:58 PM Zebedee SAUNDERS wrote: Medication: Tirzepatide -Weight Management (ZEPBOUND ) 15 mg MG/0.5ML SOLN  Has the patient contacted their pharmacy? Yes (Agent: If no, request that the patient contact the pharmacy for the refill. If patient does not wish to contact the pharmacy document the reason why and proceed with request.) (Agent: If yes, when and what did the pharmacy advise?)  This is the patient's preferred pharmacy:   [facility] [place], [place] - [phone] [place] [address] Phone: [phone] Fax: [phone]  Is this the correct pharmacy for this prescription? Yes If no, delete pharmacy and type the correct one.   Has the prescription been filled recently? Yes  Is the patient out of the medication? Yes  Has the patient been seen for an appointment in the last year OR does the patient have an upcoming appointment? Yes  Can we respond through MyChart? Yes  Agent: Please be advised that Rx refills may take up to 3 business days. We ask that you follow-up with your pharmacy. >> Dec 18, 2023  1:03 PM Zebedee SAUNDERS wrote: Pt stated that Leita Longs NP is refilling medication for pt.

## 2023-12-18 NOTE — Telephone Encounter (Signed)
 Copied from CRM #[id number]. Topic: Clinical - Medication Refill >> Dec 18, 2023 12:58 PM Zebedee SAUNDERS wrote: Medication: Tirzepatide -Weight Management (ZEPBOUND ) 15 mg MG/0.5ML SOLN  Has the patient contacted their pharmacy? Yes (Agent: If no, request that the patient contact the pharmacy for the refill. If patient does not wish to contact the pharmacy document the reason why and proceed with request.) (Agent: If yes, when and what did the pharmacy advise?)  This is the patient's preferred pharmacy:   [facility] [place], [place] - [address] [phone] [place] [phone] Phone: [phone] Fax: [phone]  Is this the correct pharmacy for this prescription? Yes If no, delete pharmacy and type the correct one.   Has the prescription been filled recently? Yes  Is the patient out of the medication? Yes  Has the patient been seen for an appointment in the last year OR does the patient have an upcoming appointment? Yes  Can we respond through MyChart? Yes  Agent: Please be advised that Rx refills may take up to 3 business days. We ask that you follow-up with your pharmacy.

## 2024-01-14 ENCOUNTER — Other Ambulatory Visit: Payer: Self-pay | Admitting: Family Medicine

## 2024-01-17 ENCOUNTER — Other Ambulatory Visit: Payer: Self-pay | Admitting: Family Medicine

## 2024-02-29 ENCOUNTER — Other Ambulatory Visit: Payer: Self-pay | Admitting: Family Medicine

## 2024-03-10 ENCOUNTER — Other Ambulatory Visit (HOSPITAL_COMMUNITY): Payer: Self-pay | Admitting: Psychiatry

## 2024-03-10 NOTE — Telephone Encounter (Signed)
 Call for appt

## 2024-04-04 ENCOUNTER — Ambulatory Visit: Payer: HMO

## 2024-04-06 ENCOUNTER — Ambulatory Visit
